# Patient Record
Sex: Female | Born: 1945 | Race: Black or African American | Hispanic: No | Marital: Single | Smoking: Former smoker
Health system: Southern US, Community
[De-identification: ages and names within clinical notes are randomized; demographics above are authoritative.]

## PROBLEM LIST (undated history)

## (undated) DIAGNOSIS — N189 Chronic kidney disease, unspecified: Secondary | ICD-10-CM

## (undated) DIAGNOSIS — E039 Hypothyroidism, unspecified: Secondary | ICD-10-CM

## (undated) DIAGNOSIS — M1990 Unspecified osteoarthritis, unspecified site: Secondary | ICD-10-CM

## (undated) DIAGNOSIS — E119 Type 2 diabetes mellitus without complications: Secondary | ICD-10-CM

## (undated) DIAGNOSIS — I10 Essential (primary) hypertension: Secondary | ICD-10-CM

## (undated) HISTORY — PX: BREAST SURGERY: SHX581

## (undated) HISTORY — PX: APPENDECTOMY: SHX54

## (undated) HISTORY — PX: CATARACT EXTRACTION, BILATERAL: SHX1313

## (undated) HISTORY — PX: ABDOMINAL HYSTERECTOMY: SHX81

## (undated) HISTORY — PX: HAND SURGERY: SHX662

---

## 2005-01-21 ENCOUNTER — Ambulatory Visit (HOSPITAL_COMMUNITY): Admission: RE | Admit: 2005-01-21 | Discharge: 2005-01-21 | Payer: Self-pay | Admitting: Family Medicine

## 2005-09-19 ENCOUNTER — Encounter (HOSPITAL_COMMUNITY): Admission: RE | Admit: 2005-09-19 | Discharge: 2005-10-19 | Payer: Self-pay | Admitting: Family Medicine

## 2006-12-29 ENCOUNTER — Ambulatory Visit: Payer: Self-pay | Admitting: Orthopedic Surgery

## 2007-06-26 DIAGNOSIS — E119 Type 2 diabetes mellitus without complications: Secondary | ICD-10-CM | POA: Insufficient documentation

## 2007-06-26 DIAGNOSIS — Z8679 Personal history of other diseases of the circulatory system: Secondary | ICD-10-CM | POA: Insufficient documentation

## 2007-06-29 ENCOUNTER — Ambulatory Visit: Payer: Self-pay | Admitting: Orthopedic Surgery

## 2007-06-29 DIAGNOSIS — M25569 Pain in unspecified knee: Secondary | ICD-10-CM | POA: Insufficient documentation

## 2007-06-29 DIAGNOSIS — M1710 Unilateral primary osteoarthritis, unspecified knee: Secondary | ICD-10-CM

## 2007-11-09 ENCOUNTER — Ambulatory Visit: Payer: Self-pay | Admitting: Orthopedic Surgery

## 2007-11-09 DIAGNOSIS — G5600 Carpal tunnel syndrome, unspecified upper limb: Secondary | ICD-10-CM

## 2007-11-10 ENCOUNTER — Encounter: Payer: Self-pay | Admitting: Orthopedic Surgery

## 2007-11-19 ENCOUNTER — Encounter: Payer: Self-pay | Admitting: Orthopedic Surgery

## 2008-01-11 ENCOUNTER — Ambulatory Visit: Payer: Self-pay | Admitting: Orthopedic Surgery

## 2008-03-09 ENCOUNTER — Ambulatory Visit: Payer: Self-pay | Admitting: Orthopedic Surgery

## 2008-03-14 ENCOUNTER — Encounter: Payer: Self-pay | Admitting: Orthopedic Surgery

## 2008-04-22 ENCOUNTER — Encounter: Payer: Self-pay | Admitting: Orthopedic Surgery

## 2008-05-02 ENCOUNTER — Telehealth: Payer: Self-pay | Admitting: Orthopedic Surgery

## 2008-05-19 ENCOUNTER — Encounter: Payer: Self-pay | Admitting: Orthopedic Surgery

## 2008-05-30 ENCOUNTER — Encounter: Payer: Self-pay | Admitting: Orthopedic Surgery

## 2008-07-01 ENCOUNTER — Encounter: Payer: Self-pay | Admitting: Orthopedic Surgery

## 2008-07-04 ENCOUNTER — Telehealth: Payer: Self-pay | Admitting: Orthopedic Surgery

## 2008-07-21 ENCOUNTER — Telehealth: Payer: Self-pay | Admitting: Orthopedic Surgery

## 2008-10-03 ENCOUNTER — Emergency Department (HOSPITAL_COMMUNITY): Admission: EM | Admit: 2008-10-03 | Discharge: 2008-10-04 | Payer: Self-pay | Admitting: Emergency Medicine

## 2008-10-05 ENCOUNTER — Ambulatory Visit (HOSPITAL_COMMUNITY): Admission: RE | Admit: 2008-10-05 | Discharge: 2008-10-05 | Payer: Self-pay | Admitting: Family Medicine

## 2009-09-25 ENCOUNTER — Ambulatory Visit (HOSPITAL_COMMUNITY): Admission: RE | Admit: 2009-09-25 | Discharge: 2009-09-25 | Payer: Self-pay | Admitting: Family Medicine

## 2009-10-07 ENCOUNTER — Encounter: Payer: Self-pay | Admitting: Orthopedic Surgery

## 2009-10-07 ENCOUNTER — Emergency Department (HOSPITAL_COMMUNITY): Admission: EM | Admit: 2009-10-07 | Discharge: 2009-10-07 | Payer: Self-pay | Admitting: Emergency Medicine

## 2009-10-31 ENCOUNTER — Encounter: Payer: Self-pay | Admitting: Orthopedic Surgery

## 2009-11-01 ENCOUNTER — Ambulatory Visit: Payer: Self-pay | Admitting: Orthopedic Surgery

## 2009-11-01 DIAGNOSIS — M1710 Unilateral primary osteoarthritis, unspecified knee: Secondary | ICD-10-CM | POA: Insufficient documentation

## 2009-11-01 DIAGNOSIS — IMO0002 Reserved for concepts with insufficient information to code with codable children: Secondary | ICD-10-CM

## 2010-08-14 NOTE — Assessment & Plan Note (Signed)
Summary: [name]   Vital Signs:  Patient profile:   65 year old female Height:      67 inches Weight:      306 pounds Pulse rate:   78 / minute Resp:     16 per minute  Vitals Entered By: [name] (November 01, 2009 10:24 AM)  Visit Type:  new patient Referring Provider:  Dr. Caron Presume and [facility] Primary Provider:  Dr. Caron Presume  CC:  right knee pain.  History of Present Illness: I saw Joan Torres in the office today for an initial visit.  She is a 64 years old woman with the complaint of:  posterior right knee pain.  The patient hurt her knee, 3 and half weeks ago, she hit it against a door. She was having some symptoms before that and now has pain in the back of the knee and not in the back of the knee and great 10 pain, which is constant.  She has throbbing and stabbing, and some anterolateral pain as well.  She went to the emergency room the x-rays show medial compartment arthritis  [facility] xrays3/26/[facility] right knee.  Meds: Lisinopril, Pravastatin, Actoplus, Lasix, Synthroid, Chlorthalidone, Glyburide, Diltiazem, ASA.  On 10/07/09 [facility] er was given Prdenisone for 5 days and Naprosyn two times a day for 10 days.          Allergies (verified): No Known Drug Allergies  Past History:  Past Medical History: Diabetes High Blood Pressure arthritis Thyroid  Past Surgical History: Partial hysterectomy 2 Cyst on breast, benign cyst on hand benign  Family History: Family History of Diabetes Family History of Arthritis  Social History: Patient is single.  retired no smoking no alcohol no caffeine  Review of Systems Constitutional:  Complains of weight gain and chills; denies weight loss, fever, and fatigue. Cardiovascular:  Denies chest pain, palpitations, fainting, and murmurs. Respiratory:  Denies short of breath, wheezing, couch, tightness, pain on inspiration, and snoring . Gastrointestinal:  Complains of heartburn and  blood in your stools; denies nausea, vomiting, diarrhea, and constipation. Genitourinary:  Denies frequency, urgency, difficulty urinating, painful urination, flank pain, and bleeding in urine. Neurologic:  Denies numbness, tingling, unsteady gait, dizziness, tremors, and seizure. Musculoskeletal:  Complains of joint pain; denies swelling, instability, stiffness, redness, heat, and muscle pain. Endocrine:  Denies excessive thirst, exessive urination, and heat or cold intolerance. Psychiatric:  Denies nervousness, depression, anxiety, and hallucinations. Skin:  Denies changes in the skin, poor healing, rash, itching, and redness. HEENT:  Complains of watering; denies blurred or double vision, eye pain, and redness. Immunology:  Denies seasonal allergies, sinus problems, and allergic to bee stings. Hemoatologic:  Denies easy bleeding and brusing.  Physical Exam  Additional Exam:  GEN: well developed, well nourished, normal grooming and hygiene, no deformity and abnormal body habitus, obese  CDV: pulses are normal, no edema, no erythema. no tenderness  Lymph: normal lymph nodes   Skin: no rashes, skin lesions or open sores   NEURO: normal coordination, reflexes, sensation.   Psyche: awake, alert and oriented. Mood normal   Gait: looks normal to me.  She has some lateral, and medial joint line pain. No effusion.  She is 115 of knee flexion with full extension.  grade 5 motor exam, ligaments, stable      Impression & Recommendations:  Problem # 1:  ARTHRITIS, RIGHT KNEE (ICD-716.96) Assessment Comment Only  Verbal consent was obtained. The knee was prepped with alcohol and  ethyl chloride. 1 cc of depomedrol 40mg /cc and 4 cc of lidocaine 1% was injected. there were no complications. injected RIGHT knee.  Hospital films were observed. 4 views, RIGHT knee medial compartment arthritis. It is moderate to severe  Orders: New Patient Level III [id number]) Depo- Medrol 40mg   (J1030) Joint Aspirate / Injection, Large (20610)  Medications Added to Medication List This Visit: 1)  Lortab 5-500 Mg Tabs (Hydrocodone-acetaminophen) .Marland Kitchen.. 1 by mouth q 4 as needed pain  Patient Instructions: 1)  You have received an injection of cortisone today. You may experience increased pain at the injection site. Apply ice pack to the area for 20 minutes every 2 hours and take 2 xtra strength tylenol every 8 hours. This increased pain will usually resolve in 24 hours. The injection will take effect in 3-10 days.  2)  Please schedule a follow-up appointment in 3 weeks. Prescriptions: LORTAB 5-500 MG TABS (HYDROCODONE-ACETAMINOPHEN) 1 by mouth q 4 as needed pain  #42 x 2   Entered and Authorized by:   [name]   Signed by:   [name] on 11/01/2009   Method used:   Print then Give to Patient   RxID:   [id number]

## 2010-08-14 NOTE — Letter (Signed)
Summary: History form  History form   Imported By: Ruffin Pyo 11/02/2009 10:29:43  _____________________________________________________________________  External Attachment:    Type:   Image     Comment:   External Document

## 2010-10-25 LAB — POCT I-STAT, CHEM 8
Calcium, Ion: 1.06 mmol/L — ABNORMAL LOW (ref 1.12–1.32)
Chloride: 101 mEq/L (ref 96–112)
HCT: 37 % (ref 36.0–46.0)
Hemoglobin: 12.6 g/dL (ref 12.0–15.0)
Potassium: 2.5 mEq/L — CL (ref 3.5–5.1)
Sodium: 146 mEq/L — ABNORMAL HIGH (ref 135–145)
TCO2: 27 mmol/L (ref 0–100)

## 2010-10-25 LAB — URINALYSIS, ROUTINE W REFLEX MICROSCOPIC
Specific Gravity, Urine: 1.015 (ref 1.005–1.030)
Urobilinogen, UA: 0.2 mg/dL (ref 0.0–1.0)

## 2010-10-25 LAB — URINE MICROSCOPIC-ADD ON

## 2011-01-16 IMAGING — CR DG KNEE COMPLETE 4+V*R*
4 series · 4 of 4 positions shown · non-contrast
Comparison: None

CLINICAL DATA: Right knee pain.  No injury.  Knee swelling.

RIGHT KNEE - COMPLETE 4+ VIEW

[view not recorded (1 of 4)]
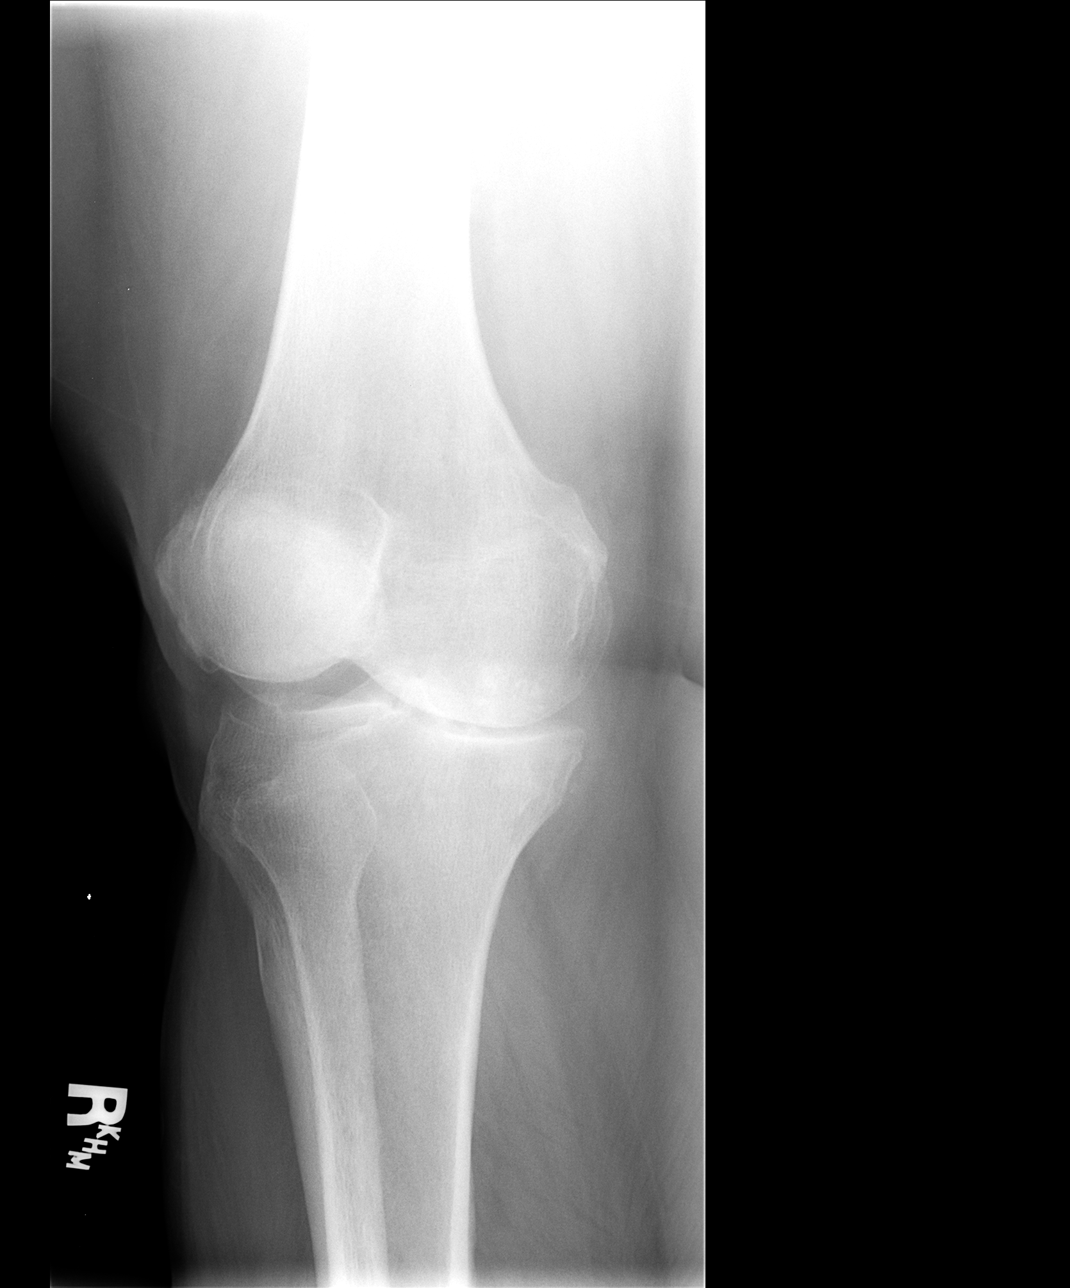

[view not recorded (2 of 4)]
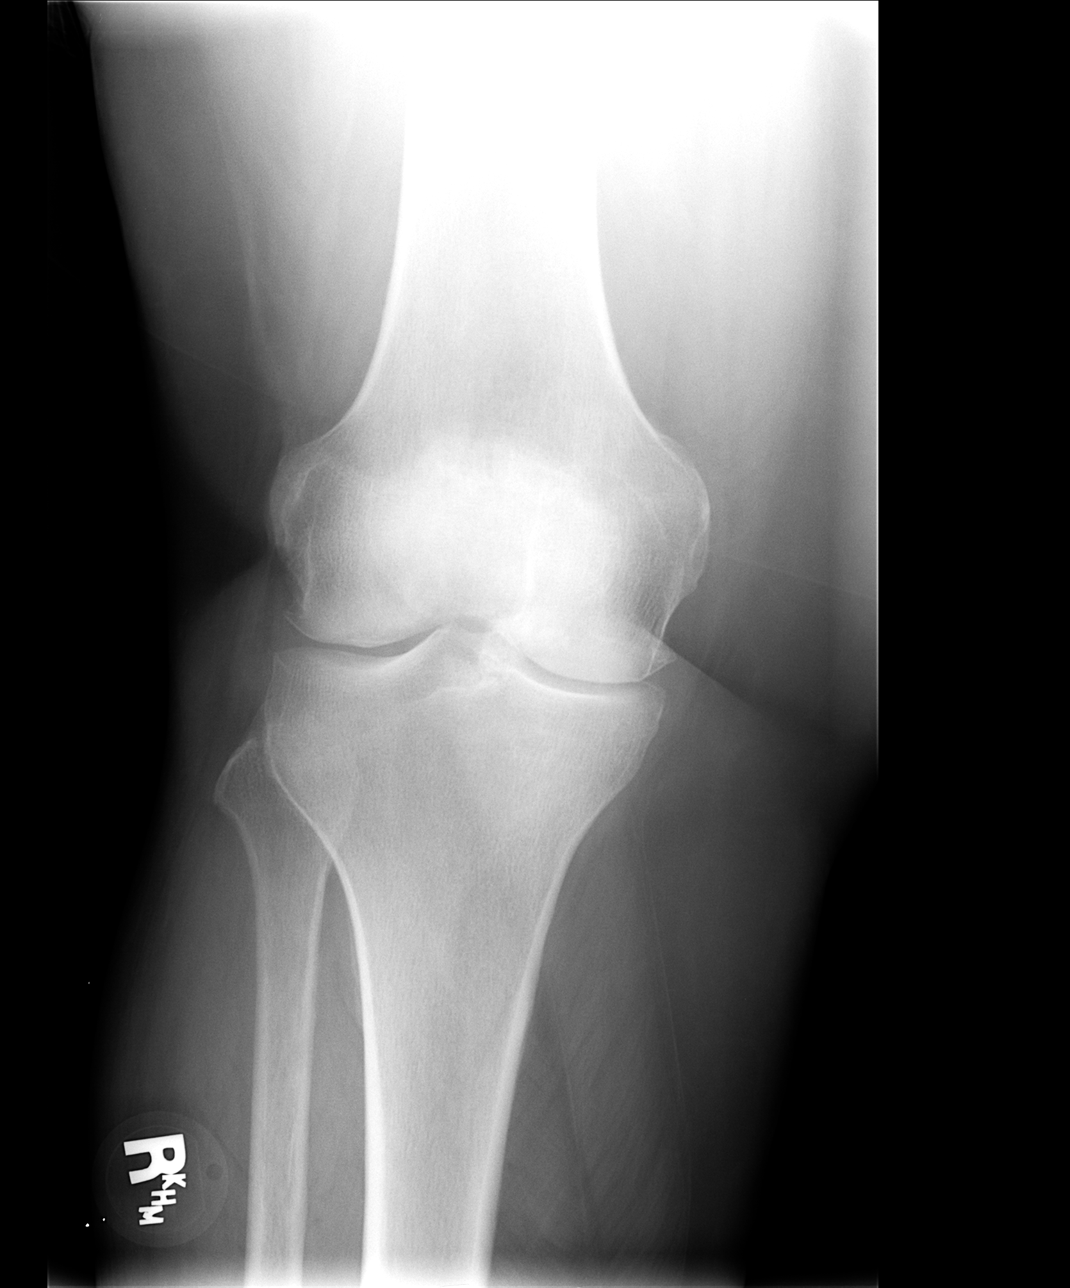

[view not recorded (3 of 4)]
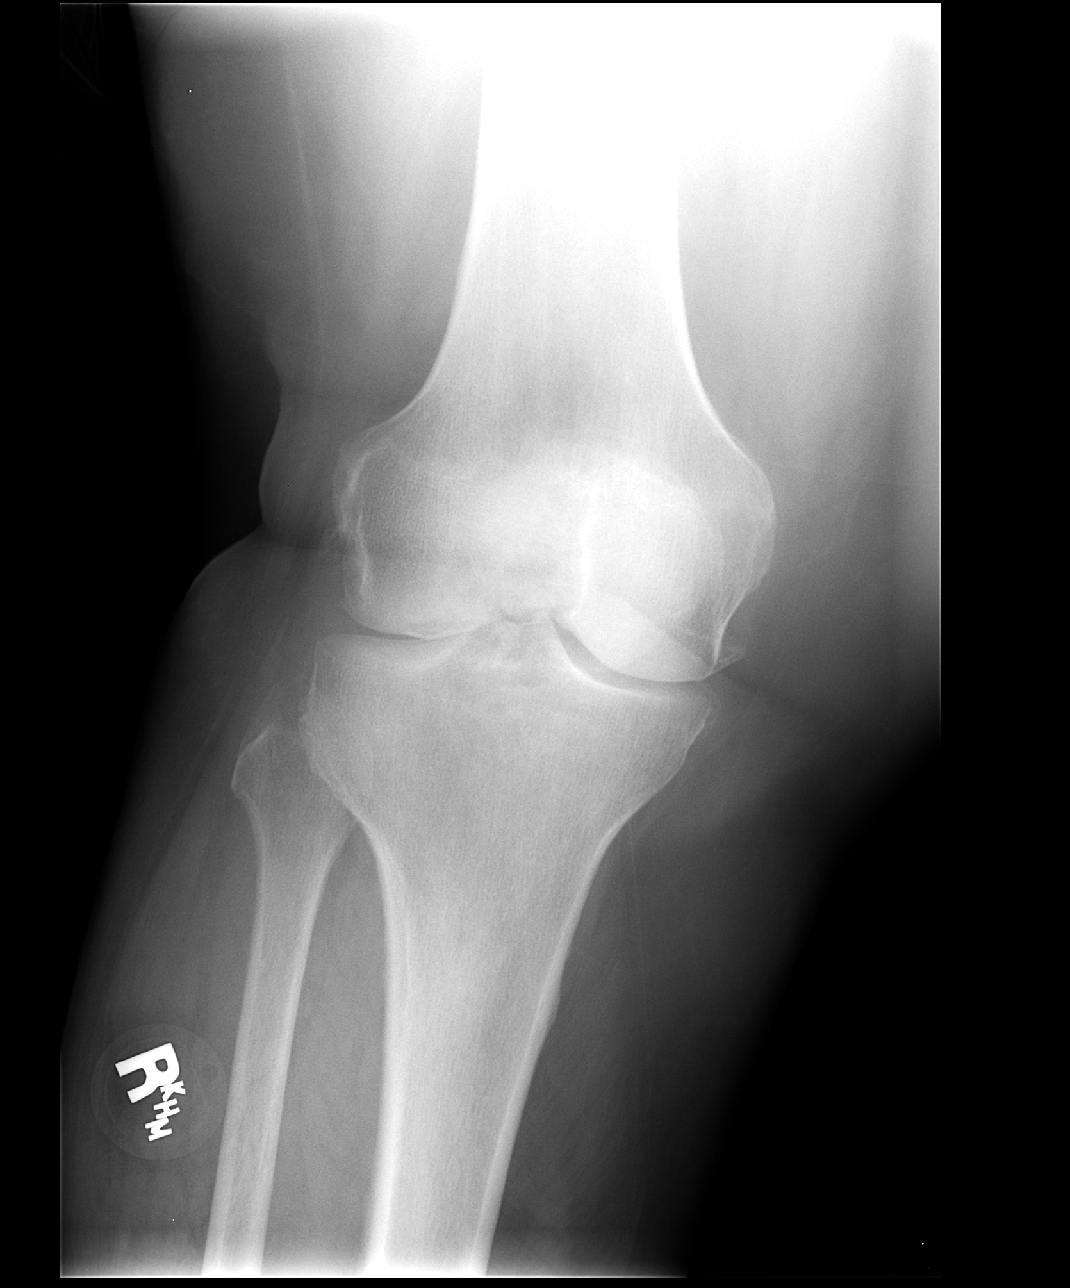

[view not recorded (4 of 4)]
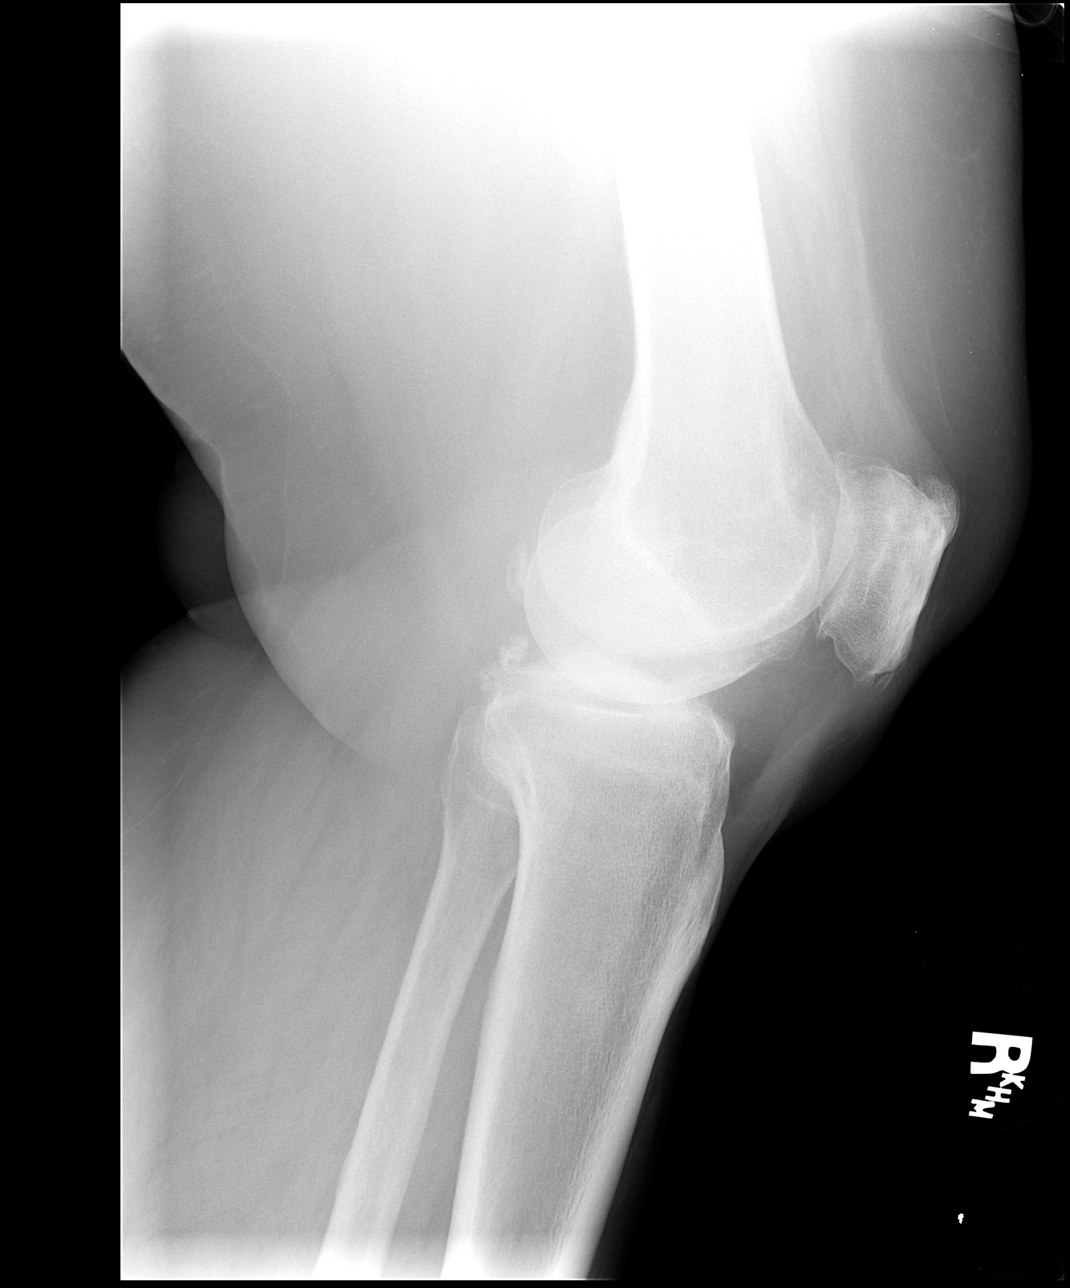

[4 of 4 positions shown; findings below may reference images not displayed]

FINDINGS: There is no joint effusion.

There is no fracture or dislocation identified.

Marginal spur formation and sharpening tibial spines noted.

There are no fractures.

There are several ossific densities within the posterior joint
space which may represent loose bodies.
IMPRESSION: 1.  Moderate degenerative joint disease.
2.  No acute changes.

## 2014-02-10 DIAGNOSIS — E7800 Pure hypercholesterolemia, unspecified: Secondary | ICD-10-CM | POA: Insufficient documentation

## 2014-02-10 DIAGNOSIS — I10 Essential (primary) hypertension: Secondary | ICD-10-CM | POA: Insufficient documentation

## 2014-02-10 DIAGNOSIS — M1710 Unilateral primary osteoarthritis, unspecified knee: Secondary | ICD-10-CM | POA: Insufficient documentation

## 2014-02-10 DIAGNOSIS — N183 Chronic kidney disease, stage 3 unspecified: Secondary | ICD-10-CM | POA: Insufficient documentation

## 2014-03-18 DIAGNOSIS — I440 Atrioventricular block, first degree: Secondary | ICD-10-CM | POA: Insufficient documentation

## 2014-07-28 DIAGNOSIS — J302 Other seasonal allergic rhinitis: Secondary | ICD-10-CM | POA: Diagnosis not present

## 2014-07-28 DIAGNOSIS — N184 Chronic kidney disease, stage 4 (severe): Secondary | ICD-10-CM | POA: Diagnosis not present

## 2014-07-28 DIAGNOSIS — E039 Hypothyroidism, unspecified: Secondary | ICD-10-CM | POA: Diagnosis not present

## 2014-07-28 DIAGNOSIS — E1165 Type 2 diabetes mellitus with hyperglycemia: Secondary | ICD-10-CM | POA: Diagnosis not present

## 2014-07-28 DIAGNOSIS — J019 Acute sinusitis, unspecified: Secondary | ICD-10-CM | POA: Diagnosis not present

## 2014-08-08 DIAGNOSIS — E039 Hypothyroidism, unspecified: Secondary | ICD-10-CM | POA: Diagnosis not present

## 2014-08-08 DIAGNOSIS — N184 Chronic kidney disease, stage 4 (severe): Secondary | ICD-10-CM | POA: Diagnosis not present

## 2014-08-08 DIAGNOSIS — M109 Gout, unspecified: Secondary | ICD-10-CM | POA: Diagnosis not present

## 2014-08-08 DIAGNOSIS — E1165 Type 2 diabetes mellitus with hyperglycemia: Secondary | ICD-10-CM | POA: Diagnosis not present

## 2014-08-08 DIAGNOSIS — E782 Mixed hyperlipidemia: Secondary | ICD-10-CM | POA: Diagnosis not present

## 2014-08-08 DIAGNOSIS — E559 Vitamin D deficiency, unspecified: Secondary | ICD-10-CM | POA: Diagnosis not present

## 2014-10-27 DIAGNOSIS — Z1231 Encounter for screening mammogram for malignant neoplasm of breast: Secondary | ICD-10-CM | POA: Diagnosis not present

## 2014-12-15 DIAGNOSIS — I10 Essential (primary) hypertension: Secondary | ICD-10-CM | POA: Diagnosis not present

## 2014-12-15 DIAGNOSIS — E782 Mixed hyperlipidemia: Secondary | ICD-10-CM | POA: Diagnosis not present

## 2014-12-15 DIAGNOSIS — E039 Hypothyroidism, unspecified: Secondary | ICD-10-CM | POA: Diagnosis not present

## 2014-12-15 DIAGNOSIS — N184 Chronic kidney disease, stage 4 (severe): Secondary | ICD-10-CM | POA: Diagnosis not present

## 2014-12-15 DIAGNOSIS — E1165 Type 2 diabetes mellitus with hyperglycemia: Secondary | ICD-10-CM | POA: Diagnosis not present

## 2014-12-22 DIAGNOSIS — M109 Gout, unspecified: Secondary | ICD-10-CM | POA: Diagnosis not present

## 2014-12-22 DIAGNOSIS — S1096XA Insect bite of unspecified part of neck, initial encounter: Secondary | ICD-10-CM | POA: Diagnosis not present

## 2014-12-22 DIAGNOSIS — Z0001 Encounter for general adult medical examination with abnormal findings: Secondary | ICD-10-CM | POA: Diagnosis not present

## 2014-12-22 DIAGNOSIS — I10 Essential (primary) hypertension: Secondary | ICD-10-CM | POA: Diagnosis not present

## 2014-12-22 DIAGNOSIS — E1165 Type 2 diabetes mellitus with hyperglycemia: Secondary | ICD-10-CM | POA: Diagnosis not present

## 2014-12-22 DIAGNOSIS — Z1389 Encounter for screening for other disorder: Secondary | ICD-10-CM | POA: Diagnosis not present

## 2014-12-22 DIAGNOSIS — Z23 Encounter for immunization: Secondary | ICD-10-CM | POA: Diagnosis not present

## 2014-12-22 DIAGNOSIS — E782 Mixed hyperlipidemia: Secondary | ICD-10-CM | POA: Diagnosis not present

## 2014-12-23 DIAGNOSIS — E1165 Type 2 diabetes mellitus with hyperglycemia: Secondary | ICD-10-CM | POA: Diagnosis not present

## 2015-01-22 DIAGNOSIS — I10 Essential (primary) hypertension: Secondary | ICD-10-CM | POA: Diagnosis not present

## 2015-01-25 DIAGNOSIS — L603 Nail dystrophy: Secondary | ICD-10-CM | POA: Diagnosis not present

## 2015-01-25 DIAGNOSIS — M79609 Pain in unspecified limb: Secondary | ICD-10-CM | POA: Diagnosis not present

## 2015-01-25 DIAGNOSIS — M722 Plantar fascial fibromatosis: Secondary | ICD-10-CM | POA: Diagnosis not present

## 2015-01-25 DIAGNOSIS — I739 Peripheral vascular disease, unspecified: Secondary | ICD-10-CM | POA: Diagnosis not present

## 2015-02-22 DIAGNOSIS — I10 Essential (primary) hypertension: Secondary | ICD-10-CM | POA: Diagnosis not present

## 2015-03-25 DIAGNOSIS — I10 Essential (primary) hypertension: Secondary | ICD-10-CM | POA: Diagnosis not present

## 2015-04-13 DIAGNOSIS — E1151 Type 2 diabetes mellitus with diabetic peripheral angiopathy without gangrene: Secondary | ICD-10-CM | POA: Diagnosis not present

## 2015-04-13 DIAGNOSIS — I739 Peripheral vascular disease, unspecified: Secondary | ICD-10-CM | POA: Diagnosis not present

## 2015-04-13 DIAGNOSIS — L603 Nail dystrophy: Secondary | ICD-10-CM | POA: Diagnosis not present

## 2015-04-24 DIAGNOSIS — I10 Essential (primary) hypertension: Secondary | ICD-10-CM | POA: Diagnosis not present

## 2015-05-25 DIAGNOSIS — I10 Essential (primary) hypertension: Secondary | ICD-10-CM | POA: Diagnosis not present

## 2015-05-25 DIAGNOSIS — E119 Type 2 diabetes mellitus without complications: Secondary | ICD-10-CM | POA: Diagnosis not present

## 2015-06-14 DIAGNOSIS — E782 Mixed hyperlipidemia: Secondary | ICD-10-CM | POA: Diagnosis not present

## 2015-06-14 DIAGNOSIS — E1165 Type 2 diabetes mellitus with hyperglycemia: Secondary | ICD-10-CM | POA: Diagnosis not present

## 2015-06-14 DIAGNOSIS — I10 Essential (primary) hypertension: Secondary | ICD-10-CM | POA: Diagnosis not present

## 2015-06-21 DIAGNOSIS — E782 Mixed hyperlipidemia: Secondary | ICD-10-CM | POA: Diagnosis not present

## 2015-06-21 DIAGNOSIS — I10 Essential (primary) hypertension: Secondary | ICD-10-CM | POA: Diagnosis not present

## 2015-06-21 DIAGNOSIS — M7751 Other enthesopathy of right foot: Secondary | ICD-10-CM | POA: Diagnosis not present

## 2015-06-21 DIAGNOSIS — E039 Hypothyroidism, unspecified: Secondary | ICD-10-CM | POA: Diagnosis not present

## 2015-06-21 DIAGNOSIS — E1122 Type 2 diabetes mellitus with diabetic chronic kidney disease: Secondary | ICD-10-CM | POA: Diagnosis not present

## 2015-06-21 DIAGNOSIS — E1165 Type 2 diabetes mellitus with hyperglycemia: Secondary | ICD-10-CM | POA: Diagnosis not present

## 2015-06-21 DIAGNOSIS — M109 Gout, unspecified: Secondary | ICD-10-CM | POA: Diagnosis not present

## 2015-06-21 DIAGNOSIS — N184 Chronic kidney disease, stage 4 (severe): Secondary | ICD-10-CM | POA: Diagnosis not present

## 2015-06-24 DIAGNOSIS — J45909 Unspecified asthma, uncomplicated: Secondary | ICD-10-CM | POA: Diagnosis not present

## 2015-06-24 DIAGNOSIS — I10 Essential (primary) hypertension: Secondary | ICD-10-CM | POA: Diagnosis not present

## 2015-09-15 DIAGNOSIS — E782 Mixed hyperlipidemia: Secondary | ICD-10-CM | POA: Diagnosis not present

## 2015-09-15 DIAGNOSIS — E559 Vitamin D deficiency, unspecified: Secondary | ICD-10-CM | POA: Diagnosis not present

## 2015-09-15 DIAGNOSIS — E1122 Type 2 diabetes mellitus with diabetic chronic kidney disease: Secondary | ICD-10-CM | POA: Diagnosis not present

## 2015-09-15 DIAGNOSIS — N184 Chronic kidney disease, stage 4 (severe): Secondary | ICD-10-CM | POA: Diagnosis not present

## 2015-09-15 DIAGNOSIS — I10 Essential (primary) hypertension: Secondary | ICD-10-CM | POA: Diagnosis not present

## 2015-09-19 DIAGNOSIS — N184 Chronic kidney disease, stage 4 (severe): Secondary | ICD-10-CM | POA: Diagnosis not present

## 2015-09-19 DIAGNOSIS — E1122 Type 2 diabetes mellitus with diabetic chronic kidney disease: Secondary | ICD-10-CM | POA: Diagnosis not present

## 2015-09-19 DIAGNOSIS — M7751 Other enthesopathy of right foot: Secondary | ICD-10-CM | POA: Diagnosis not present

## 2015-09-19 DIAGNOSIS — I10 Essential (primary) hypertension: Secondary | ICD-10-CM | POA: Diagnosis not present

## 2015-09-19 DIAGNOSIS — E782 Mixed hyperlipidemia: Secondary | ICD-10-CM | POA: Diagnosis not present

## 2015-09-19 DIAGNOSIS — S46111A Strain of muscle, fascia and tendon of long head of biceps, right arm, initial encounter: Secondary | ICD-10-CM | POA: Diagnosis not present

## 2015-09-19 DIAGNOSIS — E039 Hypothyroidism, unspecified: Secondary | ICD-10-CM | POA: Diagnosis not present

## 2015-09-19 DIAGNOSIS — M7652 Patellar tendinitis, left knee: Secondary | ICD-10-CM | POA: Diagnosis not present

## 2015-10-05 DIAGNOSIS — J302 Other seasonal allergic rhinitis: Secondary | ICD-10-CM | POA: Diagnosis not present

## 2015-10-05 DIAGNOSIS — J019 Acute sinusitis, unspecified: Secondary | ICD-10-CM | POA: Diagnosis not present

## 2015-10-26 ENCOUNTER — Emergency Department (HOSPITAL_COMMUNITY): Payer: No Typology Code available for payment source

## 2015-10-26 ENCOUNTER — Encounter (HOSPITAL_COMMUNITY): Payer: Self-pay

## 2015-10-26 ENCOUNTER — Emergency Department (HOSPITAL_COMMUNITY)
Admission: EM | Admit: 2015-10-26 | Discharge: 2015-10-26 | Disposition: A | Discharge status: Home / Self Care | Payer: No Typology Code available for payment source | Attending: Emergency Medicine | Admitting: Emergency Medicine

## 2015-10-26 DIAGNOSIS — Y999 Unspecified external cause status: Secondary | ICD-10-CM | POA: Insufficient documentation

## 2015-10-26 DIAGNOSIS — Z7984 Long term (current) use of oral hypoglycemic drugs: Secondary | ICD-10-CM | POA: Insufficient documentation

## 2015-10-26 DIAGNOSIS — I10 Essential (primary) hypertension: Secondary | ICD-10-CM | POA: Diagnosis not present

## 2015-10-26 DIAGNOSIS — Y9289 Other specified places as the place of occurrence of the external cause: Secondary | ICD-10-CM | POA: Diagnosis not present

## 2015-10-26 DIAGNOSIS — Y939 Activity, unspecified: Secondary | ICD-10-CM | POA: Diagnosis not present

## 2015-10-26 DIAGNOSIS — S161XXA Strain of muscle, fascia and tendon at neck level, initial encounter: Secondary | ICD-10-CM | POA: Diagnosis not present

## 2015-10-26 DIAGNOSIS — Z794 Long term (current) use of insulin: Secondary | ICD-10-CM | POA: Insufficient documentation

## 2015-10-26 DIAGNOSIS — M542 Cervicalgia: Secondary | ICD-10-CM

## 2015-10-26 DIAGNOSIS — T148XXA Other injury of unspecified body region, initial encounter: Secondary | ICD-10-CM

## 2015-10-26 DIAGNOSIS — E119 Type 2 diabetes mellitus without complications: Secondary | ICD-10-CM | POA: Insufficient documentation

## 2015-10-26 DIAGNOSIS — Z79899 Other long term (current) drug therapy: Secondary | ICD-10-CM | POA: Insufficient documentation

## 2015-10-26 DIAGNOSIS — S199XXA Unspecified injury of neck, initial encounter: Secondary | ICD-10-CM | POA: Diagnosis not present

## 2015-10-26 HISTORY — DX: Essential (primary) hypertension: I10

## 2015-10-26 HISTORY — DX: Type 2 diabetes mellitus without complications: E11.9

## 2015-10-26 MED ORDER — ACETAMINOPHEN 325 MG PO TABS
650.0000 mg | ORAL_TABLET | Freq: Once | ORAL | Status: AC
  Administered 2015-10-26: 650 mg via ORAL
  Filled 2015-10-26: qty 2
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
## 2015-10-30 DIAGNOSIS — M79629 Pain in unspecified upper arm: Secondary | ICD-10-CM | POA: Diagnosis not present

## 2015-12-14 DIAGNOSIS — E1165 Type 2 diabetes mellitus with hyperglycemia: Secondary | ICD-10-CM | POA: Diagnosis not present

## 2015-12-14 DIAGNOSIS — N184 Chronic kidney disease, stage 4 (severe): Secondary | ICD-10-CM | POA: Diagnosis not present

## 2015-12-14 DIAGNOSIS — E782 Mixed hyperlipidemia: Secondary | ICD-10-CM | POA: Diagnosis not present

## 2015-12-14 DIAGNOSIS — E039 Hypothyroidism, unspecified: Secondary | ICD-10-CM | POA: Diagnosis not present

## 2015-12-14 DIAGNOSIS — I10 Essential (primary) hypertension: Secondary | ICD-10-CM | POA: Diagnosis not present

## 2015-12-19 DIAGNOSIS — E1122 Type 2 diabetes mellitus with diabetic chronic kidney disease: Secondary | ICD-10-CM | POA: Diagnosis not present

## 2015-12-19 DIAGNOSIS — E1165 Type 2 diabetes mellitus with hyperglycemia: Secondary | ICD-10-CM | POA: Diagnosis not present

## 2015-12-19 DIAGNOSIS — E039 Hypothyroidism, unspecified: Secondary | ICD-10-CM | POA: Diagnosis not present

## 2015-12-19 DIAGNOSIS — N184 Chronic kidney disease, stage 4 (severe): Secondary | ICD-10-CM | POA: Diagnosis not present

## 2015-12-19 DIAGNOSIS — I10 Essential (primary) hypertension: Secondary | ICD-10-CM | POA: Diagnosis not present

## 2015-12-19 DIAGNOSIS — E119 Type 2 diabetes mellitus without complications: Secondary | ICD-10-CM | POA: Diagnosis not present

## 2015-12-19 DIAGNOSIS — M25512 Pain in left shoulder: Secondary | ICD-10-CM | POA: Diagnosis not present

## 2015-12-19 DIAGNOSIS — E782 Mixed hyperlipidemia: Secondary | ICD-10-CM | POA: Diagnosis not present

## 2015-12-22 DIAGNOSIS — M19012 Primary osteoarthritis, left shoulder: Secondary | ICD-10-CM | POA: Diagnosis not present

## 2015-12-22 DIAGNOSIS — M25512 Pain in left shoulder: Secondary | ICD-10-CM | POA: Diagnosis not present

## 2016-01-22 DIAGNOSIS — E039 Hypothyroidism, unspecified: Secondary | ICD-10-CM | POA: Diagnosis not present

## 2016-01-22 DIAGNOSIS — M1712 Unilateral primary osteoarthritis, left knee: Secondary | ICD-10-CM | POA: Diagnosis not present

## 2016-01-22 DIAGNOSIS — I10 Essential (primary) hypertension: Secondary | ICD-10-CM | POA: Diagnosis not present

## 2016-01-22 DIAGNOSIS — E1165 Type 2 diabetes mellitus with hyperglycemia: Secondary | ICD-10-CM | POA: Diagnosis not present

## 2016-01-22 DIAGNOSIS — E1122 Type 2 diabetes mellitus with diabetic chronic kidney disease: Secondary | ICD-10-CM | POA: Diagnosis not present

## 2016-01-22 DIAGNOSIS — N184 Chronic kidney disease, stage 4 (severe): Secondary | ICD-10-CM | POA: Diagnosis not present

## 2016-01-22 DIAGNOSIS — E782 Mixed hyperlipidemia: Secondary | ICD-10-CM | POA: Diagnosis not present

## 2016-01-22 DIAGNOSIS — H40003 Preglaucoma, unspecified, bilateral: Secondary | ICD-10-CM | POA: Diagnosis not present

## 2016-01-29 DIAGNOSIS — H40003 Preglaucoma, unspecified, bilateral: Secondary | ICD-10-CM | POA: Diagnosis not present

## 2016-03-20 DIAGNOSIS — E1165 Type 2 diabetes mellitus with hyperglycemia: Secondary | ICD-10-CM | POA: Diagnosis not present

## 2016-03-20 DIAGNOSIS — N184 Chronic kidney disease, stage 4 (severe): Secondary | ICD-10-CM | POA: Diagnosis not present

## 2016-03-20 DIAGNOSIS — I10 Essential (primary) hypertension: Secondary | ICD-10-CM | POA: Diagnosis not present

## 2016-03-20 DIAGNOSIS — E782 Mixed hyperlipidemia: Secondary | ICD-10-CM | POA: Diagnosis not present

## 2016-03-20 DIAGNOSIS — E039 Hypothyroidism, unspecified: Secondary | ICD-10-CM | POA: Diagnosis not present

## 2016-03-25 DIAGNOSIS — E1122 Type 2 diabetes mellitus with diabetic chronic kidney disease: Secondary | ICD-10-CM | POA: Diagnosis not present

## 2016-03-25 DIAGNOSIS — E559 Vitamin D deficiency, unspecified: Secondary | ICD-10-CM | POA: Diagnosis not present

## 2016-03-25 DIAGNOSIS — E039 Hypothyroidism, unspecified: Secondary | ICD-10-CM | POA: Diagnosis not present

## 2016-03-25 DIAGNOSIS — Z6841 Body Mass Index (BMI) 40.0 and over, adult: Secondary | ICD-10-CM | POA: Diagnosis not present

## 2016-03-25 DIAGNOSIS — M109 Gout, unspecified: Secondary | ICD-10-CM | POA: Diagnosis not present

## 2016-03-25 DIAGNOSIS — E1165 Type 2 diabetes mellitus with hyperglycemia: Secondary | ICD-10-CM | POA: Diagnosis not present

## 2016-03-25 DIAGNOSIS — E782 Mixed hyperlipidemia: Secondary | ICD-10-CM | POA: Diagnosis not present

## 2016-04-29 DIAGNOSIS — Z1231 Encounter for screening mammogram for malignant neoplasm of breast: Secondary | ICD-10-CM | POA: Diagnosis not present

## 2016-06-19 DIAGNOSIS — N184 Chronic kidney disease, stage 4 (severe): Secondary | ICD-10-CM | POA: Diagnosis not present

## 2016-06-19 DIAGNOSIS — M545 Low back pain: Secondary | ICD-10-CM | POA: Diagnosis not present

## 2016-06-19 DIAGNOSIS — M10372 Gout due to renal impairment, left ankle and foot: Secondary | ICD-10-CM | POA: Diagnosis not present

## 2016-06-21 DIAGNOSIS — N184 Chronic kidney disease, stage 4 (severe): Secondary | ICD-10-CM | POA: Diagnosis not present

## 2016-06-21 DIAGNOSIS — I10 Essential (primary) hypertension: Secondary | ICD-10-CM | POA: Diagnosis not present

## 2016-06-21 DIAGNOSIS — E1165 Type 2 diabetes mellitus with hyperglycemia: Secondary | ICD-10-CM | POA: Diagnosis not present

## 2016-06-21 DIAGNOSIS — E1122 Type 2 diabetes mellitus with diabetic chronic kidney disease: Secondary | ICD-10-CM | POA: Diagnosis not present

## 2016-06-21 DIAGNOSIS — E782 Mixed hyperlipidemia: Secondary | ICD-10-CM | POA: Diagnosis not present

## 2016-06-21 DIAGNOSIS — E039 Hypothyroidism, unspecified: Secondary | ICD-10-CM | POA: Diagnosis not present

## 2016-06-26 DIAGNOSIS — E1122 Type 2 diabetes mellitus with diabetic chronic kidney disease: Secondary | ICD-10-CM | POA: Diagnosis not present

## 2016-06-26 DIAGNOSIS — M19012 Primary osteoarthritis, left shoulder: Secondary | ICD-10-CM | POA: Diagnosis not present

## 2016-06-26 DIAGNOSIS — E039 Hypothyroidism, unspecified: Secondary | ICD-10-CM | POA: Diagnosis not present

## 2016-06-26 DIAGNOSIS — E782 Mixed hyperlipidemia: Secondary | ICD-10-CM | POA: Diagnosis not present

## 2016-06-26 DIAGNOSIS — I10 Essential (primary) hypertension: Secondary | ICD-10-CM | POA: Diagnosis not present

## 2016-06-26 DIAGNOSIS — E1165 Type 2 diabetes mellitus with hyperglycemia: Secondary | ICD-10-CM | POA: Diagnosis not present

## 2016-06-26 DIAGNOSIS — Z6841 Body Mass Index (BMI) 40.0 and over, adult: Secondary | ICD-10-CM | POA: Diagnosis not present

## 2016-06-26 DIAGNOSIS — N184 Chronic kidney disease, stage 4 (severe): Secondary | ICD-10-CM | POA: Diagnosis not present

## 2016-07-15 HISTORY — PX: CYST REMOVAL NECK: SHX6281

## 2016-09-25 DIAGNOSIS — I10 Essential (primary) hypertension: Secondary | ICD-10-CM | POA: Diagnosis not present

## 2016-09-25 DIAGNOSIS — E782 Mixed hyperlipidemia: Secondary | ICD-10-CM | POA: Diagnosis not present

## 2016-09-25 DIAGNOSIS — E1165 Type 2 diabetes mellitus with hyperglycemia: Secondary | ICD-10-CM | POA: Diagnosis not present

## 2016-09-25 DIAGNOSIS — E039 Hypothyroidism, unspecified: Secondary | ICD-10-CM | POA: Diagnosis not present

## 2016-09-26 DIAGNOSIS — N184 Chronic kidney disease, stage 4 (severe): Secondary | ICD-10-CM | POA: Diagnosis not present

## 2016-09-26 DIAGNOSIS — E1165 Type 2 diabetes mellitus with hyperglycemia: Secondary | ICD-10-CM | POA: Diagnosis not present

## 2016-09-26 DIAGNOSIS — E039 Hypothyroidism, unspecified: Secondary | ICD-10-CM | POA: Diagnosis not present

## 2016-09-26 DIAGNOSIS — M1712 Unilateral primary osteoarthritis, left knee: Secondary | ICD-10-CM | POA: Diagnosis not present

## 2016-09-26 DIAGNOSIS — E782 Mixed hyperlipidemia: Secondary | ICD-10-CM | POA: Diagnosis not present

## 2016-09-26 DIAGNOSIS — E1122 Type 2 diabetes mellitus with diabetic chronic kidney disease: Secondary | ICD-10-CM | POA: Diagnosis not present

## 2016-09-26 DIAGNOSIS — I10 Essential (primary) hypertension: Secondary | ICD-10-CM | POA: Diagnosis not present

## 2016-10-29 DIAGNOSIS — M19031 Primary osteoarthritis, right wrist: Secondary | ICD-10-CM | POA: Diagnosis not present

## 2016-10-29 DIAGNOSIS — M1711 Unilateral primary osteoarthritis, right knee: Secondary | ICD-10-CM | POA: Diagnosis not present

## 2016-10-29 DIAGNOSIS — M1712 Unilateral primary osteoarthritis, left knee: Secondary | ICD-10-CM | POA: Diagnosis not present

## 2016-10-29 DIAGNOSIS — M19041 Primary osteoarthritis, right hand: Secondary | ICD-10-CM | POA: Diagnosis not present

## 2016-11-22 DIAGNOSIS — M25571 Pain in right ankle and joints of right foot: Secondary | ICD-10-CM | POA: Diagnosis not present

## 2016-11-22 DIAGNOSIS — M10371 Gout due to renal impairment, right ankle and foot: Secondary | ICD-10-CM | POA: Diagnosis not present

## 2016-11-22 DIAGNOSIS — M1A0710 Idiopathic chronic gout, right ankle and foot, without tophus (tophi): Secondary | ICD-10-CM | POA: Diagnosis not present

## 2016-12-27 DIAGNOSIS — E782 Mixed hyperlipidemia: Secondary | ICD-10-CM | POA: Diagnosis not present

## 2016-12-27 DIAGNOSIS — E1165 Type 2 diabetes mellitus with hyperglycemia: Secondary | ICD-10-CM | POA: Diagnosis not present

## 2016-12-27 DIAGNOSIS — N184 Chronic kidney disease, stage 4 (severe): Secondary | ICD-10-CM | POA: Diagnosis not present

## 2016-12-27 DIAGNOSIS — E559 Vitamin D deficiency, unspecified: Secondary | ICD-10-CM | POA: Diagnosis not present

## 2016-12-27 DIAGNOSIS — I10 Essential (primary) hypertension: Secondary | ICD-10-CM | POA: Diagnosis not present

## 2016-12-27 DIAGNOSIS — E039 Hypothyroidism, unspecified: Secondary | ICD-10-CM | POA: Diagnosis not present

## 2016-12-27 DIAGNOSIS — E1122 Type 2 diabetes mellitus with diabetic chronic kidney disease: Secondary | ICD-10-CM | POA: Diagnosis not present

## 2017-01-01 DIAGNOSIS — E039 Hypothyroidism, unspecified: Secondary | ICD-10-CM | POA: Diagnosis not present

## 2017-01-01 DIAGNOSIS — Z6841 Body Mass Index (BMI) 40.0 and over, adult: Secondary | ICD-10-CM | POA: Diagnosis not present

## 2017-01-01 DIAGNOSIS — Z0001 Encounter for general adult medical examination with abnormal findings: Secondary | ICD-10-CM | POA: Diagnosis not present

## 2017-01-01 DIAGNOSIS — E559 Vitamin D deficiency, unspecified: Secondary | ICD-10-CM | POA: Diagnosis not present

## 2017-01-01 DIAGNOSIS — M10372 Gout due to renal impairment, left ankle and foot: Secondary | ICD-10-CM | POA: Diagnosis not present

## 2017-01-01 DIAGNOSIS — E1165 Type 2 diabetes mellitus with hyperglycemia: Secondary | ICD-10-CM | POA: Diagnosis not present

## 2017-01-01 DIAGNOSIS — E1122 Type 2 diabetes mellitus with diabetic chronic kidney disease: Secondary | ICD-10-CM | POA: Diagnosis not present

## 2017-01-01 DIAGNOSIS — I10 Essential (primary) hypertension: Secondary | ICD-10-CM | POA: Diagnosis not present

## 2017-01-06 DIAGNOSIS — E042 Nontoxic multinodular goiter: Secondary | ICD-10-CM | POA: Diagnosis not present

## 2017-01-09 DIAGNOSIS — H524 Presbyopia: Secondary | ICD-10-CM | POA: Diagnosis not present

## 2017-01-09 DIAGNOSIS — Z0100 Encounter for examination of eyes and vision without abnormal findings: Secondary | ICD-10-CM | POA: Diagnosis not present

## 2017-01-11 DIAGNOSIS — S86911A Strain of unspecified muscle(s) and tendon(s) at lower leg level, right leg, initial encounter: Secondary | ICD-10-CM | POA: Diagnosis not present

## 2017-01-11 DIAGNOSIS — I129 Hypertensive chronic kidney disease with stage 1 through stage 4 chronic kidney disease, or unspecified chronic kidney disease: Secondary | ICD-10-CM | POA: Diagnosis not present

## 2017-01-11 DIAGNOSIS — M79661 Pain in right lower leg: Secondary | ICD-10-CM | POA: Diagnosis not present

## 2017-01-11 DIAGNOSIS — E1122 Type 2 diabetes mellitus with diabetic chronic kidney disease: Secondary | ICD-10-CM | POA: Diagnosis not present

## 2017-01-11 DIAGNOSIS — E876 Hypokalemia: Secondary | ICD-10-CM | POA: Diagnosis not present

## 2017-01-11 DIAGNOSIS — M1711 Unilateral primary osteoarthritis, right knee: Secondary | ICD-10-CM | POA: Diagnosis not present

## 2017-01-11 DIAGNOSIS — M19071 Primary osteoarthritis, right ankle and foot: Secondary | ICD-10-CM | POA: Diagnosis not present

## 2017-01-11 DIAGNOSIS — N289 Disorder of kidney and ureter, unspecified: Secondary | ICD-10-CM | POA: Diagnosis not present

## 2017-01-11 DIAGNOSIS — E039 Hypothyroidism, unspecified: Secondary | ICD-10-CM | POA: Diagnosis not present

## 2017-01-11 DIAGNOSIS — N189 Chronic kidney disease, unspecified: Secondary | ICD-10-CM | POA: Diagnosis not present

## 2017-01-14 DIAGNOSIS — N184 Chronic kidney disease, stage 4 (severe): Secondary | ICD-10-CM | POA: Diagnosis not present

## 2017-01-14 DIAGNOSIS — E782 Mixed hyperlipidemia: Secondary | ICD-10-CM | POA: Diagnosis not present

## 2017-01-14 DIAGNOSIS — M545 Low back pain: Secondary | ICD-10-CM | POA: Diagnosis not present

## 2017-01-14 DIAGNOSIS — E1122 Type 2 diabetes mellitus with diabetic chronic kidney disease: Secondary | ICD-10-CM | POA: Diagnosis not present

## 2017-01-14 DIAGNOSIS — M1711 Unilateral primary osteoarthritis, right knee: Secondary | ICD-10-CM | POA: Diagnosis not present

## 2017-01-14 DIAGNOSIS — M79661 Pain in right lower leg: Secondary | ICD-10-CM | POA: Diagnosis not present

## 2017-01-19 DIAGNOSIS — R252 Cramp and spasm: Secondary | ICD-10-CM | POA: Diagnosis not present

## 2017-01-19 DIAGNOSIS — I129 Hypertensive chronic kidney disease with stage 1 through stage 4 chronic kidney disease, or unspecified chronic kidney disease: Secondary | ICD-10-CM | POA: Diagnosis not present

## 2017-01-19 DIAGNOSIS — E871 Hypo-osmolality and hyponatremia: Secondary | ICD-10-CM | POA: Diagnosis not present

## 2017-01-19 DIAGNOSIS — E039 Hypothyroidism, unspecified: Secondary | ICD-10-CM | POA: Diagnosis not present

## 2017-01-19 DIAGNOSIS — N189 Chronic kidney disease, unspecified: Secondary | ICD-10-CM | POA: Diagnosis not present

## 2017-01-19 DIAGNOSIS — E876 Hypokalemia: Secondary | ICD-10-CM | POA: Diagnosis not present

## 2017-01-19 DIAGNOSIS — M79604 Pain in right leg: Secondary | ICD-10-CM | POA: Diagnosis not present

## 2017-01-19 DIAGNOSIS — E1122 Type 2 diabetes mellitus with diabetic chronic kidney disease: Secondary | ICD-10-CM | POA: Diagnosis not present

## 2017-01-19 DIAGNOSIS — E785 Hyperlipidemia, unspecified: Secondary | ICD-10-CM | POA: Diagnosis not present

## 2017-01-20 DIAGNOSIS — R252 Cramp and spasm: Secondary | ICD-10-CM | POA: Diagnosis not present

## 2017-01-28 DIAGNOSIS — M79661 Pain in right lower leg: Secondary | ICD-10-CM | POA: Diagnosis not present

## 2017-01-28 DIAGNOSIS — N184 Chronic kidney disease, stage 4 (severe): Secondary | ICD-10-CM | POA: Diagnosis not present

## 2017-01-28 DIAGNOSIS — E039 Hypothyroidism, unspecified: Secondary | ICD-10-CM | POA: Diagnosis not present

## 2017-01-28 DIAGNOSIS — I10 Essential (primary) hypertension: Secondary | ICD-10-CM | POA: Diagnosis not present

## 2017-01-28 DIAGNOSIS — E1122 Type 2 diabetes mellitus with diabetic chronic kidney disease: Secondary | ICD-10-CM | POA: Diagnosis not present

## 2017-01-28 DIAGNOSIS — L723 Sebaceous cyst: Secondary | ICD-10-CM | POA: Diagnosis not present

## 2017-01-28 DIAGNOSIS — R252 Cramp and spasm: Secondary | ICD-10-CM | POA: Diagnosis not present

## 2017-02-03 DIAGNOSIS — M7661 Achilles tendinitis, right leg: Secondary | ICD-10-CM | POA: Diagnosis not present

## 2017-02-03 DIAGNOSIS — R252 Cramp and spasm: Secondary | ICD-10-CM | POA: Diagnosis not present

## 2017-02-03 DIAGNOSIS — M79661 Pain in right lower leg: Secondary | ICD-10-CM | POA: Diagnosis not present

## 2017-02-05 DIAGNOSIS — L02212 Cutaneous abscess of back [any part, except buttock]: Secondary | ICD-10-CM | POA: Diagnosis not present

## 2017-02-06 DIAGNOSIS — Z48817 Encounter for surgical aftercare following surgery on the skin and subcutaneous tissue: Secondary | ICD-10-CM | POA: Diagnosis not present

## 2017-02-06 DIAGNOSIS — L02212 Cutaneous abscess of back [any part, except buttock]: Secondary | ICD-10-CM | POA: Diagnosis not present

## 2017-02-07 DIAGNOSIS — Z48817 Encounter for surgical aftercare following surgery on the skin and subcutaneous tissue: Secondary | ICD-10-CM | POA: Diagnosis not present

## 2017-02-07 DIAGNOSIS — L02212 Cutaneous abscess of back [any part, except buttock]: Secondary | ICD-10-CM | POA: Diagnosis not present

## 2017-02-08 DIAGNOSIS — L02212 Cutaneous abscess of back [any part, except buttock]: Secondary | ICD-10-CM | POA: Diagnosis not present

## 2017-02-08 DIAGNOSIS — Z48817 Encounter for surgical aftercare following surgery on the skin and subcutaneous tissue: Secondary | ICD-10-CM | POA: Diagnosis not present

## 2017-02-09 DIAGNOSIS — L02212 Cutaneous abscess of back [any part, except buttock]: Secondary | ICD-10-CM | POA: Diagnosis not present

## 2017-02-09 DIAGNOSIS — Z48817 Encounter for surgical aftercare following surgery on the skin and subcutaneous tissue: Secondary | ICD-10-CM | POA: Diagnosis not present

## 2017-02-10 DIAGNOSIS — L02212 Cutaneous abscess of back [any part, except buttock]: Secondary | ICD-10-CM | POA: Diagnosis not present

## 2017-02-10 DIAGNOSIS — Z48817 Encounter for surgical aftercare following surgery on the skin and subcutaneous tissue: Secondary | ICD-10-CM | POA: Diagnosis not present

## 2017-02-11 DIAGNOSIS — L02212 Cutaneous abscess of back [any part, except buttock]: Secondary | ICD-10-CM | POA: Diagnosis not present

## 2017-02-11 DIAGNOSIS — Z48817 Encounter for surgical aftercare following surgery on the skin and subcutaneous tissue: Secondary | ICD-10-CM | POA: Diagnosis not present

## 2017-02-13 DIAGNOSIS — L02212 Cutaneous abscess of back [any part, except buttock]: Secondary | ICD-10-CM | POA: Diagnosis not present

## 2017-02-13 DIAGNOSIS — Z48817 Encounter for surgical aftercare following surgery on the skin and subcutaneous tissue: Secondary | ICD-10-CM | POA: Diagnosis not present

## 2017-02-14 DIAGNOSIS — L02212 Cutaneous abscess of back [any part, except buttock]: Secondary | ICD-10-CM | POA: Diagnosis not present

## 2017-02-14 DIAGNOSIS — Z48817 Encounter for surgical aftercare following surgery on the skin and subcutaneous tissue: Secondary | ICD-10-CM | POA: Diagnosis not present

## 2017-02-15 DIAGNOSIS — Z48817 Encounter for surgical aftercare following surgery on the skin and subcutaneous tissue: Secondary | ICD-10-CM | POA: Diagnosis not present

## 2017-02-15 DIAGNOSIS — L02212 Cutaneous abscess of back [any part, except buttock]: Secondary | ICD-10-CM | POA: Diagnosis not present

## 2017-02-16 DIAGNOSIS — L02212 Cutaneous abscess of back [any part, except buttock]: Secondary | ICD-10-CM | POA: Diagnosis not present

## 2017-02-16 DIAGNOSIS — Z48817 Encounter for surgical aftercare following surgery on the skin and subcutaneous tissue: Secondary | ICD-10-CM | POA: Diagnosis not present

## 2017-02-17 DIAGNOSIS — Z48817 Encounter for surgical aftercare following surgery on the skin and subcutaneous tissue: Secondary | ICD-10-CM | POA: Diagnosis not present

## 2017-02-17 DIAGNOSIS — L02212 Cutaneous abscess of back [any part, except buttock]: Secondary | ICD-10-CM | POA: Diagnosis not present

## 2017-02-18 DIAGNOSIS — L02212 Cutaneous abscess of back [any part, except buttock]: Secondary | ICD-10-CM | POA: Diagnosis not present

## 2017-02-18 DIAGNOSIS — Z48817 Encounter for surgical aftercare following surgery on the skin and subcutaneous tissue: Secondary | ICD-10-CM | POA: Diagnosis not present

## 2017-02-19 DIAGNOSIS — M10372 Gout due to renal impairment, left ankle and foot: Secondary | ICD-10-CM | POA: Diagnosis not present

## 2017-02-19 DIAGNOSIS — N184 Chronic kidney disease, stage 4 (severe): Secondary | ICD-10-CM | POA: Diagnosis not present

## 2017-02-19 DIAGNOSIS — E039 Hypothyroidism, unspecified: Secondary | ICD-10-CM | POA: Diagnosis not present

## 2017-02-19 DIAGNOSIS — E1122 Type 2 diabetes mellitus with diabetic chronic kidney disease: Secondary | ICD-10-CM | POA: Diagnosis not present

## 2017-02-19 DIAGNOSIS — M10371 Gout due to renal impairment, right ankle and foot: Secondary | ICD-10-CM | POA: Diagnosis not present

## 2017-02-19 DIAGNOSIS — I10 Essential (primary) hypertension: Secondary | ICD-10-CM | POA: Diagnosis not present

## 2017-02-19 DIAGNOSIS — L02212 Cutaneous abscess of back [any part, except buttock]: Secondary | ICD-10-CM | POA: Diagnosis not present

## 2017-02-19 DIAGNOSIS — Z48817 Encounter for surgical aftercare following surgery on the skin and subcutaneous tissue: Secondary | ICD-10-CM | POA: Diagnosis not present

## 2017-02-19 DIAGNOSIS — E782 Mixed hyperlipidemia: Secondary | ICD-10-CM | POA: Diagnosis not present

## 2017-02-19 DIAGNOSIS — E1165 Type 2 diabetes mellitus with hyperglycemia: Secondary | ICD-10-CM | POA: Diagnosis not present

## 2017-02-20 DIAGNOSIS — L02212 Cutaneous abscess of back [any part, except buttock]: Secondary | ICD-10-CM | POA: Diagnosis not present

## 2017-02-20 DIAGNOSIS — Z48817 Encounter for surgical aftercare following surgery on the skin and subcutaneous tissue: Secondary | ICD-10-CM | POA: Diagnosis not present

## 2017-02-21 DIAGNOSIS — L02212 Cutaneous abscess of back [any part, except buttock]: Secondary | ICD-10-CM | POA: Diagnosis not present

## 2017-02-21 DIAGNOSIS — Z48817 Encounter for surgical aftercare following surgery on the skin and subcutaneous tissue: Secondary | ICD-10-CM | POA: Diagnosis not present

## 2017-02-22 DIAGNOSIS — L02212 Cutaneous abscess of back [any part, except buttock]: Secondary | ICD-10-CM | POA: Diagnosis not present

## 2017-02-22 DIAGNOSIS — Z48817 Encounter for surgical aftercare following surgery on the skin and subcutaneous tissue: Secondary | ICD-10-CM | POA: Diagnosis not present

## 2017-02-23 DIAGNOSIS — Z48817 Encounter for surgical aftercare following surgery on the skin and subcutaneous tissue: Secondary | ICD-10-CM | POA: Diagnosis not present

## 2017-02-23 DIAGNOSIS — L02212 Cutaneous abscess of back [any part, except buttock]: Secondary | ICD-10-CM | POA: Diagnosis not present

## 2017-02-24 DIAGNOSIS — I10 Essential (primary) hypertension: Secondary | ICD-10-CM | POA: Diagnosis not present

## 2017-02-24 DIAGNOSIS — Z0001 Encounter for general adult medical examination with abnormal findings: Secondary | ICD-10-CM | POA: Diagnosis not present

## 2017-02-24 DIAGNOSIS — Z720 Tobacco use: Secondary | ICD-10-CM | POA: Diagnosis not present

## 2017-02-24 DIAGNOSIS — L02212 Cutaneous abscess of back [any part, except buttock]: Secondary | ICD-10-CM | POA: Diagnosis not present

## 2017-02-24 DIAGNOSIS — Z6841 Body Mass Index (BMI) 40.0 and over, adult: Secondary | ICD-10-CM | POA: Diagnosis not present

## 2017-02-24 DIAGNOSIS — E1165 Type 2 diabetes mellitus with hyperglycemia: Secondary | ICD-10-CM | POA: Diagnosis not present

## 2017-02-24 DIAGNOSIS — Z48817 Encounter for surgical aftercare following surgery on the skin and subcutaneous tissue: Secondary | ICD-10-CM | POA: Diagnosis not present

## 2017-02-25 DIAGNOSIS — Z48817 Encounter for surgical aftercare following surgery on the skin and subcutaneous tissue: Secondary | ICD-10-CM | POA: Diagnosis not present

## 2017-02-25 DIAGNOSIS — L02212 Cutaneous abscess of back [any part, except buttock]: Secondary | ICD-10-CM | POA: Diagnosis not present

## 2017-03-03 DIAGNOSIS — L02212 Cutaneous abscess of back [any part, except buttock]: Secondary | ICD-10-CM | POA: Insufficient documentation

## 2017-03-06 DIAGNOSIS — L723 Sebaceous cyst: Secondary | ICD-10-CM | POA: Insufficient documentation

## 2017-03-14 DIAGNOSIS — L723 Sebaceous cyst: Secondary | ICD-10-CM | POA: Diagnosis not present

## 2017-03-14 DIAGNOSIS — N189 Chronic kidney disease, unspecified: Secondary | ICD-10-CM | POA: Diagnosis not present

## 2017-03-14 DIAGNOSIS — I129 Hypertensive chronic kidney disease with stage 1 through stage 4 chronic kidney disease, or unspecified chronic kidney disease: Secondary | ICD-10-CM | POA: Diagnosis not present

## 2017-03-14 DIAGNOSIS — E785 Hyperlipidemia, unspecified: Secondary | ICD-10-CM | POA: Diagnosis not present

## 2017-03-14 DIAGNOSIS — L720 Epidermal cyst: Secondary | ICD-10-CM | POA: Diagnosis not present

## 2017-03-14 DIAGNOSIS — E1122 Type 2 diabetes mellitus with diabetic chronic kidney disease: Secondary | ICD-10-CM | POA: Diagnosis not present

## 2017-03-14 DIAGNOSIS — E039 Hypothyroidism, unspecified: Secondary | ICD-10-CM | POA: Diagnosis not present

## 2017-03-18 DIAGNOSIS — I129 Hypertensive chronic kidney disease with stage 1 through stage 4 chronic kidney disease, or unspecified chronic kidney disease: Secondary | ICD-10-CM | POA: Diagnosis not present

## 2017-03-18 DIAGNOSIS — L723 Sebaceous cyst: Secondary | ICD-10-CM | POA: Diagnosis not present

## 2017-03-18 DIAGNOSIS — N189 Chronic kidney disease, unspecified: Secondary | ICD-10-CM | POA: Diagnosis not present

## 2017-03-18 DIAGNOSIS — L720 Epidermal cyst: Secondary | ICD-10-CM | POA: Diagnosis not present

## 2017-03-18 DIAGNOSIS — E785 Hyperlipidemia, unspecified: Secondary | ICD-10-CM | POA: Diagnosis not present

## 2017-03-18 DIAGNOSIS — E039 Hypothyroidism, unspecified: Secondary | ICD-10-CM | POA: Diagnosis not present

## 2017-03-18 DIAGNOSIS — E1122 Type 2 diabetes mellitus with diabetic chronic kidney disease: Secondary | ICD-10-CM | POA: Diagnosis not present

## 2017-05-22 DIAGNOSIS — E782 Mixed hyperlipidemia: Secondary | ICD-10-CM | POA: Diagnosis not present

## 2017-05-22 DIAGNOSIS — E039 Hypothyroidism, unspecified: Secondary | ICD-10-CM | POA: Diagnosis not present

## 2017-05-22 DIAGNOSIS — I10 Essential (primary) hypertension: Secondary | ICD-10-CM | POA: Diagnosis not present

## 2017-05-22 DIAGNOSIS — E1122 Type 2 diabetes mellitus with diabetic chronic kidney disease: Secondary | ICD-10-CM | POA: Diagnosis not present

## 2017-05-22 DIAGNOSIS — N184 Chronic kidney disease, stage 4 (severe): Secondary | ICD-10-CM | POA: Diagnosis not present

## 2017-05-22 DIAGNOSIS — E1165 Type 2 diabetes mellitus with hyperglycemia: Secondary | ICD-10-CM | POA: Diagnosis not present

## 2017-05-28 DIAGNOSIS — I10 Essential (primary) hypertension: Secondary | ICD-10-CM | POA: Diagnosis not present

## 2017-05-28 DIAGNOSIS — E1122 Type 2 diabetes mellitus with diabetic chronic kidney disease: Secondary | ICD-10-CM | POA: Diagnosis not present

## 2017-05-28 DIAGNOSIS — E039 Hypothyroidism, unspecified: Secondary | ICD-10-CM | POA: Diagnosis not present

## 2017-05-28 DIAGNOSIS — E782 Mixed hyperlipidemia: Secondary | ICD-10-CM | POA: Diagnosis not present

## 2017-05-28 DIAGNOSIS — Z23 Encounter for immunization: Secondary | ICD-10-CM | POA: Diagnosis not present

## 2017-05-28 DIAGNOSIS — M79661 Pain in right lower leg: Secondary | ICD-10-CM | POA: Diagnosis not present

## 2017-05-28 DIAGNOSIS — N184 Chronic kidney disease, stage 4 (severe): Secondary | ICD-10-CM | POA: Diagnosis not present

## 2017-06-10 DIAGNOSIS — M79661 Pain in right lower leg: Secondary | ICD-10-CM | POA: Diagnosis not present

## 2017-06-10 DIAGNOSIS — R262 Difficulty in walking, not elsewhere classified: Secondary | ICD-10-CM | POA: Diagnosis not present

## 2017-06-13 DIAGNOSIS — M79661 Pain in right lower leg: Secondary | ICD-10-CM | POA: Diagnosis not present

## 2017-06-13 DIAGNOSIS — R262 Difficulty in walking, not elsewhere classified: Secondary | ICD-10-CM | POA: Diagnosis not present

## 2017-06-17 DIAGNOSIS — R262 Difficulty in walking, not elsewhere classified: Secondary | ICD-10-CM | POA: Diagnosis not present

## 2017-06-17 DIAGNOSIS — M79661 Pain in right lower leg: Secondary | ICD-10-CM | POA: Diagnosis not present

## 2017-06-20 DIAGNOSIS — R262 Difficulty in walking, not elsewhere classified: Secondary | ICD-10-CM | POA: Diagnosis not present

## 2017-06-20 DIAGNOSIS — M79661 Pain in right lower leg: Secondary | ICD-10-CM | POA: Diagnosis not present

## 2017-07-04 DIAGNOSIS — M79661 Pain in right lower leg: Secondary | ICD-10-CM | POA: Diagnosis not present

## 2017-07-04 DIAGNOSIS — R262 Difficulty in walking, not elsewhere classified: Secondary | ICD-10-CM | POA: Diagnosis not present

## 2017-07-11 DIAGNOSIS — M79661 Pain in right lower leg: Secondary | ICD-10-CM | POA: Diagnosis not present

## 2017-07-11 DIAGNOSIS — R262 Difficulty in walking, not elsewhere classified: Secondary | ICD-10-CM | POA: Diagnosis not present

## 2017-07-15 HISTORY — PX: BACK SURGERY: SHX140

## 2017-07-16 DIAGNOSIS — N184 Chronic kidney disease, stage 4 (severe): Secondary | ICD-10-CM | POA: Diagnosis not present

## 2017-07-16 DIAGNOSIS — M545 Low back pain: Secondary | ICD-10-CM | POA: Diagnosis not present

## 2017-07-16 DIAGNOSIS — E1122 Type 2 diabetes mellitus with diabetic chronic kidney disease: Secondary | ICD-10-CM | POA: Diagnosis not present

## 2017-07-16 DIAGNOSIS — M5416 Radiculopathy, lumbar region: Secondary | ICD-10-CM | POA: Diagnosis not present

## 2017-07-16 DIAGNOSIS — M79661 Pain in right lower leg: Secondary | ICD-10-CM | POA: Diagnosis not present

## 2017-07-16 DIAGNOSIS — I10 Essential (primary) hypertension: Secondary | ICD-10-CM | POA: Diagnosis not present

## 2017-07-31 ENCOUNTER — Other Ambulatory Visit: Payer: Self-pay | Admitting: Family Medicine

## 2017-07-31 DIAGNOSIS — M545 Low back pain: Secondary | ICD-10-CM

## 2017-08-09 ENCOUNTER — Ambulatory Visit
Admission: RE | Admit: 2017-08-09 | Discharge: 2017-08-09 | Disposition: A | Discharge status: Home / Self Care | Payer: Commercial Managed Care - HMO | Source: Ambulatory Visit | Attending: Family Medicine | Admitting: Family Medicine

## 2017-08-09 DIAGNOSIS — M48061 Spinal stenosis, lumbar region without neurogenic claudication: Secondary | ICD-10-CM | POA: Diagnosis not present

## 2017-08-09 DIAGNOSIS — M545 Low back pain: Secondary | ICD-10-CM

## 2017-08-25 DIAGNOSIS — M5416 Radiculopathy, lumbar region: Secondary | ICD-10-CM | POA: Diagnosis not present

## 2017-08-25 DIAGNOSIS — I10 Essential (primary) hypertension: Secondary | ICD-10-CM | POA: Diagnosis not present

## 2017-08-25 DIAGNOSIS — Z6841 Body Mass Index (BMI) 40.0 and over, adult: Secondary | ICD-10-CM | POA: Diagnosis not present

## 2017-08-28 DIAGNOSIS — E1165 Type 2 diabetes mellitus with hyperglycemia: Secondary | ICD-10-CM | POA: Diagnosis not present

## 2017-08-28 DIAGNOSIS — E782 Mixed hyperlipidemia: Secondary | ICD-10-CM | POA: Diagnosis not present

## 2017-08-28 DIAGNOSIS — N184 Chronic kidney disease, stage 4 (severe): Secondary | ICD-10-CM | POA: Diagnosis not present

## 2017-08-28 DIAGNOSIS — E1122 Type 2 diabetes mellitus with diabetic chronic kidney disease: Secondary | ICD-10-CM | POA: Diagnosis not present

## 2017-08-28 DIAGNOSIS — I10 Essential (primary) hypertension: Secondary | ICD-10-CM | POA: Diagnosis not present

## 2017-08-28 DIAGNOSIS — E876 Hypokalemia: Secondary | ICD-10-CM | POA: Diagnosis not present

## 2017-09-01 DIAGNOSIS — E1122 Type 2 diabetes mellitus with diabetic chronic kidney disease: Secondary | ICD-10-CM | POA: Diagnosis not present

## 2017-09-01 DIAGNOSIS — N184 Chronic kidney disease, stage 4 (severe): Secondary | ICD-10-CM | POA: Diagnosis not present

## 2017-09-01 DIAGNOSIS — I10 Essential (primary) hypertension: Secondary | ICD-10-CM | POA: Diagnosis not present

## 2017-09-01 DIAGNOSIS — Z6841 Body Mass Index (BMI) 40.0 and over, adult: Secondary | ICD-10-CM | POA: Diagnosis not present

## 2017-09-01 DIAGNOSIS — M1711 Unilateral primary osteoarthritis, right knee: Secondary | ICD-10-CM | POA: Diagnosis not present

## 2017-09-01 DIAGNOSIS — M10372 Gout due to renal impairment, left ankle and foot: Secondary | ICD-10-CM | POA: Diagnosis not present

## 2017-09-01 DIAGNOSIS — E039 Hypothyroidism, unspecified: Secondary | ICD-10-CM | POA: Diagnosis not present

## 2017-09-30 DIAGNOSIS — M5416 Radiculopathy, lumbar region: Secondary | ICD-10-CM | POA: Diagnosis not present

## 2017-10-20 DIAGNOSIS — M4726 Other spondylosis with radiculopathy, lumbar region: Secondary | ICD-10-CM | POA: Diagnosis not present

## 2017-10-20 DIAGNOSIS — I10 Essential (primary) hypertension: Secondary | ICD-10-CM | POA: Diagnosis not present

## 2017-10-20 DIAGNOSIS — Z6841 Body Mass Index (BMI) 40.0 and over, adult: Secondary | ICD-10-CM | POA: Diagnosis not present

## 2017-11-11 DIAGNOSIS — M5416 Radiculopathy, lumbar region: Secondary | ICD-10-CM | POA: Diagnosis not present

## 2017-11-11 DIAGNOSIS — M4726 Other spondylosis with radiculopathy, lumbar region: Secondary | ICD-10-CM | POA: Diagnosis not present

## 2017-11-26 DIAGNOSIS — I10 Essential (primary) hypertension: Secondary | ICD-10-CM | POA: Diagnosis not present

## 2017-11-26 DIAGNOSIS — E1165 Type 2 diabetes mellitus with hyperglycemia: Secondary | ICD-10-CM | POA: Diagnosis not present

## 2017-11-26 DIAGNOSIS — E782 Mixed hyperlipidemia: Secondary | ICD-10-CM | POA: Diagnosis not present

## 2017-11-26 DIAGNOSIS — E876 Hypokalemia: Secondary | ICD-10-CM | POA: Diagnosis not present

## 2017-11-26 DIAGNOSIS — E1122 Type 2 diabetes mellitus with diabetic chronic kidney disease: Secondary | ICD-10-CM | POA: Diagnosis not present

## 2017-11-28 DIAGNOSIS — N184 Chronic kidney disease, stage 4 (severe): Secondary | ICD-10-CM | POA: Diagnosis not present

## 2017-11-28 DIAGNOSIS — E1122 Type 2 diabetes mellitus with diabetic chronic kidney disease: Secondary | ICD-10-CM | POA: Diagnosis not present

## 2017-11-28 DIAGNOSIS — I10 Essential (primary) hypertension: Secondary | ICD-10-CM | POA: Diagnosis not present

## 2017-11-28 DIAGNOSIS — E039 Hypothyroidism, unspecified: Secondary | ICD-10-CM | POA: Diagnosis not present

## 2017-11-28 DIAGNOSIS — M10372 Gout due to renal impairment, left ankle and foot: Secondary | ICD-10-CM | POA: Diagnosis not present

## 2017-11-28 DIAGNOSIS — Z6841 Body Mass Index (BMI) 40.0 and over, adult: Secondary | ICD-10-CM | POA: Diagnosis not present

## 2017-12-04 DIAGNOSIS — I517 Cardiomegaly: Secondary | ICD-10-CM | POA: Diagnosis not present

## 2017-12-04 DIAGNOSIS — R011 Cardiac murmur, unspecified: Secondary | ICD-10-CM | POA: Diagnosis not present

## 2017-12-04 DIAGNOSIS — I272 Pulmonary hypertension, unspecified: Secondary | ICD-10-CM | POA: Diagnosis not present

## 2017-12-04 DIAGNOSIS — I358 Other nonrheumatic aortic valve disorders: Secondary | ICD-10-CM | POA: Diagnosis not present

## 2017-12-10 DIAGNOSIS — Z6841 Body Mass Index (BMI) 40.0 and over, adult: Secondary | ICD-10-CM | POA: Diagnosis not present

## 2017-12-10 DIAGNOSIS — I10 Essential (primary) hypertension: Secondary | ICD-10-CM | POA: Diagnosis not present

## 2017-12-10 DIAGNOSIS — M4726 Other spondylosis with radiculopathy, lumbar region: Secondary | ICD-10-CM | POA: Diagnosis not present

## 2017-12-26 ENCOUNTER — Other Ambulatory Visit: Payer: Self-pay | Admitting: Neurosurgery

## 2018-01-07 ENCOUNTER — Other Ambulatory Visit: Payer: Self-pay | Admitting: Neurosurgery

## 2018-01-08 NOTE — Pre-Procedure Instructions (Addendum)
Joan Torres  01/08/2018      [facility] [address], [place] [address] Phone: [phone] Fax: [phone]    Your procedure is scheduled on July 9th at 10:45.  Report to [facility] Admitting at 8:45 A.M.  Call this number if you have problems the morning of surgery:  [phone]   Remember:  Do not eat or drink after midnight.   Continue all medications as directed by your physician except follow these medication instructions before surgery below     Take these medicines the morning of surgery with A SIP OF WATER   amLODipine (NORVASC) 5 MG tablet  CARTIA XT 180 MG 24 hr capsule  levothyroxine (SYNTHROID, LEVOTHROID) 125 MCG tablet   Follow your doctors instructions regarding your Aspirin.  If no instructions were given by your doctor, then you will need to call the prescribing office office to get instructions.    7 days prior to surgery STOP taking any Aspirin(unless otherwise instructed by your surgeon), Aleve, Naproxen, Ibuprofen, Motrin, Advil, Goody's, BC's, all herbal medications, fish oil, and all vitamins   WHAT DO I DO ABOUT MY DIABETES MEDICATION?   Marland Kitchen Do not take oral diabetes medicines (pills) the morning of surgery.      THE MORNING OF SURGERY,  DO NOT TAKE YOUR glipiZIDE (Glucotrol) 10mg  tablet or metFORMIN (GLUCOPHAGE) 1000mg  tablet.  . The day of surgery, do not take other diabetes injectables, including Byetta (exenatide), Bydureon (exenatide ER), Victoza (liraglutide), or Trulicity (dulaglutide).  . If your CBG is greater than 220 mg/dL, you may take  of your sliding scale (correction) dose of insulin.   How to Manage Your Diabetes Before and After Surgery  Why is it important to control my blood sugar before and after surgery? . Improving blood sugar levels before and after surgery helps healing and can limit problems. . A way of improving blood sugar control is eating a healthy diet  by: o  Eating less sugar and carbohydrates o  Increasing activity/exercise o  Talking with your doctor about reaching your blood sugar goals . High blood sugars (greater than 180 mg/dL) can raise your risk of infections and slow your recovery, so you will need to focus on controlling your diabetes during the weeks before surgery. . Make sure that the doctor who takes care of your diabetes knows about your planned surgery including the date and location.  How do I manage my blood sugar before surgery? . Check your blood sugar at least 4 times a day, starting 2 days before surgery, to make sure that the level is not too high or low. o Check your blood sugar the morning of your surgery when you wake up and every 2 hours until you get to the Short Stay unit. . If your blood sugar is less than 70 mg/dL, you will need to treat for low blood sugar: o Do not take insulin. o Treat a low blood sugar (less than 70 mg/dL) with  cup of clear juice (cranberry or apple), 4 glucose tablets, OR glucose gel. o Recheck blood sugar in 15 minutes after treatment (to make sure it is greater than 70 mg/dL). If your blood sugar is not greater than 70 mg/dL on recheck, call [phone] for further instructions. . Report your blood sugar to the short stay nurse when you get to Short Stay.  . If you are admitted to the hospital after  surgery: o Your blood sugar will be checked by the staff and you will probably be given insulin after surgery (instead of oral diabetes medicines) to make sure you have good blood sugar levels. o The goal for blood sugar control after surgery is 80-180 mg/dL.    Do not wear jewelry, make-up or nail polish.  Do not wear lotions, powders, or perfumes, or deodorant.  Do not shave 48 hours prior to surgery.    Do not bring valuables to the hospital.   [facility] is not responsible for any belongings or valuables.  Eyeglasses, contacts, hearing aids, dentures or bridgework may not be worn  into surgery.  Leave your suitcase in the car.  After surgery it may be brought to your room.  For patients admitted to the hospital, discharge time will be determined by your treatment team.  Patients discharged the day of surgery will not be allowed to drive home.    [facility]- Preparing For Surgery  Before surgery, you can play an important role. Because skin is not sterile, your skin needs to be as free of germs as possible. You can reduce the number of germs on your skin by washing with CHG (chlorahexidine gluconate) Soap before surgery.  CHG is an antiseptic cleaner which kills germs and bonds with the skin to continue killing germs even after washing.    Oral Hygiene is also important to reduce your risk of infection.  Remember - BRUSH YOUR TEETH THE MORNING OF SURGERY WITH YOUR REGULAR TOOTHPASTE  Please do not use if you have an allergy to CHG or antibacterial soaps. If your skin becomes reddened/irritated stop using the CHG.  Do not shave (including legs and underarms) for at least 48 hours prior to first CHG shower. It is OK to shave your face.  Please follow these instructions carefully.   1. Shower the NIGHT BEFORE SURGERY and the MORNING OF SURGERY with CHG.   2. If you chose to wash your hair, wash your hair first as usual with your normal shampoo.  3. After you shampoo, rinse your hair and body thoroughly to remove the shampoo.  4. Use CHG as you would any other liquid soap. You can apply CHG directly to the skin and wash gently with a scrungie or a clean washcloth.   5. Apply the CHG Soap to your body ONLY FROM THE NECK DOWN.  Do not use on open wounds or open sores. Avoid contact with your eyes, ears, mouth and genitals (private parts). Wash Face and genitals (private parts)  with your normal soap.  6. Wash thoroughly, paying special attention to the area where your surgery will be performed.  7. Thoroughly rinse your body with warm water from the neck down.  8. DO  NOT shower/wash with your normal soap after using and rinsing off the CHG Soap.  9. Pat yourself dry with a CLEAN TOWEL.  10. Wear CLEAN PAJAMAS to bed the night before surgery, wear comfortable clothes the morning of surgery  11. Place CLEAN SHEETS on your bed the night of your first shower and DO NOT SLEEP WITH PETS.    Day of Surgery:  Do not apply any deodorants/lotions.  Please wear clean clothes to the hospital/surgery center.   Remember to brush your teeth WITH YOUR REGULAR TOOTHPASTE.  Please read over the following fact sheets that you were given. Pain Booklet, Coughing and Deep Breathing and Surgical Site Infection Prevention

## 2018-01-09 ENCOUNTER — Other Ambulatory Visit: Payer: Self-pay

## 2018-01-09 ENCOUNTER — Encounter (HOSPITAL_COMMUNITY)
Admission: RE | Admit: 2018-01-09 | Discharge: 2018-01-09 | Disposition: A | Discharge status: Home / Self Care | Payer: Medicare HMO | Source: Ambulatory Visit | Attending: Neurosurgery | Admitting: Neurosurgery

## 2018-01-09 ENCOUNTER — Encounter (HOSPITAL_COMMUNITY): Payer: Self-pay

## 2018-01-09 DIAGNOSIS — M4726 Other spondylosis with radiculopathy, lumbar region: Secondary | ICD-10-CM | POA: Insufficient documentation

## 2018-01-09 DIAGNOSIS — Z79899 Other long term (current) drug therapy: Secondary | ICD-10-CM | POA: Diagnosis not present

## 2018-01-09 DIAGNOSIS — Z7982 Long term (current) use of aspirin: Secondary | ICD-10-CM | POA: Insufficient documentation

## 2018-01-09 DIAGNOSIS — Z01818 Encounter for other preprocedural examination: Secondary | ICD-10-CM | POA: Insufficient documentation

## 2018-01-09 DIAGNOSIS — Z7984 Long term (current) use of oral hypoglycemic drugs: Secondary | ICD-10-CM | POA: Diagnosis not present

## 2018-01-09 DIAGNOSIS — E119 Type 2 diabetes mellitus without complications: Secondary | ICD-10-CM | POA: Diagnosis not present

## 2018-01-09 DIAGNOSIS — I10 Essential (primary) hypertension: Secondary | ICD-10-CM | POA: Insufficient documentation

## 2018-01-09 DIAGNOSIS — E039 Hypothyroidism, unspecified: Secondary | ICD-10-CM | POA: Insufficient documentation

## 2018-01-09 DIAGNOSIS — Z79890 Hormone replacement therapy (postmenopausal): Secondary | ICD-10-CM | POA: Diagnosis not present

## 2018-01-09 HISTORY — DX: Hypothyroidism, unspecified: E03.9

## 2018-01-09 LAB — CBC
HEMATOCRIT: 39.7 % (ref 36.0–46.0)
HEMOGLOBIN: 12.7 g/dL (ref 12.0–15.0)
MCH: 29.4 pg (ref 26.0–34.0)
MCHC: 32 g/dL (ref 30.0–36.0)
MCV: 91.9 fL (ref 78.0–100.0)
Platelets: 342 10*3/uL (ref 150–400)
RBC: 4.32 MIL/uL (ref 3.87–5.11)
RDW: 13.5 % (ref 11.5–15.5)
WBC: 7.6 10*3/uL (ref 4.0–10.5)

## 2018-01-09 LAB — GLUCOSE, CAPILLARY: GLUCOSE-CAPILLARY: 183 mg/dL — AB (ref 70–99)

## 2018-01-09 LAB — BASIC METABOLIC PANEL
ANION GAP: 11 (ref 5–15)
BUN: 13 mg/dL (ref 8–23)
CO2: 24 mmol/L (ref 22–32)
Calcium: 10.4 mg/dL — ABNORMAL HIGH (ref 8.9–10.3)
Chloride: 104 mmol/L (ref 98–111)
Creatinine, Ser: 1.38 mg/dL — ABNORMAL HIGH (ref 0.44–1.00)
GFR calc Af Amer: 43 mL/min — ABNORMAL LOW (ref >60)
GFR calc non Af Amer: 37 mL/min — ABNORMAL LOW (ref >60)
GLUCOSE: 118 mg/dL — AB (ref 70–99)
POTASSIUM: 3.1 mmol/L — AB (ref 3.5–5.1)
Sodium: 139 mmol/L (ref 135–145)

## 2018-01-09 LAB — TYPE AND SCREEN
ABO/RH(D): O POS
ANTIBODY SCREEN: NEGATIVE

## 2018-01-09 LAB — ABO/RH: ABO/RH(D): O POS

## 2018-01-09 LAB — SURGICAL PCR SCREEN
MRSA, PCR: NEGATIVE
Staphylococcus aureus: NEGATIVE

## 2018-01-09 LAB — HEMOGLOBIN A1C
Hgb A1c MFr Bld: 7.2 % — ABNORMAL HIGH (ref 4.8–5.6)
Mean Plasma Glucose: 159.94 mg/dL

## 2018-01-09 NOTE — Progress Notes (Signed)
PCP - Dr. Judd Lien Cardiologist - denies  Chest x-ray - N/A EKG - 01/09/2018 Stress Test - denies  ECHO - denies Cardiac Cath - denies  Sleep Study - Pt states a negative sleep study in the past CPAP - denies  Fasting Blood Sugar - 125 CBG at PAT appointment 183 Checks Blood Sugar 1-2 times a day  Aspirin Instructions: Holding ASA starting July 2nd prior to surgery.   Anesthesia review: Yes  Patient denies shortness of breath, fever, cough and chest pain at PAT appointment   Patient verbalized understanding of instructions that were given to them at the PAT appointment. Patient was also instructed that they will need to review over the PAT instructions again at home before surgery.  Jacqlyn Larsen, RN

## 2018-01-12 NOTE — Progress Notes (Signed)
Anesthesia Chart Review:  Case:  [id number] Date/Time:  01/20/18 1030   Procedure:  DECOMPRESSION AND POSTERIOR LUMBAR INTERBODY FUSION LUMBAR 4- LUMBAR 5 (N/A ) - DECOMPRESSION AND POSTERIOR LUMBAR INTERBODY FUSION LUMBAR 4- LUMBAR 5   Anesthesia type:  General   Pre-op diagnosis:  OSTEOARTHRITIS OF SPINE WITH RADICULOPATHY, LUMBAR REGION   Location:  [facility] OR ROOM 81 / [facility] OR   Surgeon:  Consuella Lose, MD      DISCUSSION: Pt is a 72yo female  Hx of HTN, DMII, morbid obesity scheduled for above procedure. Per PCP office note 11/28/2017 a new RUSB murmur noted, subsequent ECHO done at [facility] reviewed without significant findings - see below for summary.   VS: BP (!) 145/71   Pulse 73   Temp 36.7 C   Resp 18   Wt 261 lb 12.8 oz (118.8 kg)   SpO2 100%   BMI 42.26 kg/m   PROVIDERS: Curlene Labrum, MD   LABS: Labs reviewed: Acceptable for surgery. (all labs ordered are listed, but only abnormal results are displayed)  Labs Reviewed  GLUCOSE, CAPILLARY - Abnormal; Notable for the following components:      Result Value   Glucose-Capillary 183 (*)    All other components within normal limits  HEMOGLOBIN A1C - Abnormal; Notable for the following components:   Hgb A1c MFr Bld 7.2 (*)    All other components within normal limits  BASIC METABOLIC PANEL - Abnormal; Notable for the following components:   Potassium 3.1 (*)    Glucose, Bld 118 (*)    Creatinine, Ser 1.38 (*)    Calcium 10.4 (*)    GFR calc non Af Amer 37 (*)    GFR calc Af Amer 43 (*)    All other components within normal limits  SURGICAL PCR SCREEN  CBC  TYPE AND SCREEN  ABO/RH     IMAGES: N/A   EKG: 01/09/2018 shows NSR   CV: Carotid US 09/25/2009 showed minimal left carotid bulb plaque resulting in less than 50% diameter stenosis.  TTE 12/04/2017:  1.  Mild concentric left ventricular hypertrophy is observed. 2.  There is normal left ventricular systolic function. 3.  The estimated ejection  fraction is 60 to 65%. 4.  The right ventricular global systolic function is normal. 5.  Mild aortic cusp sclerosis is present. 6.  There is evidence of mild pulmonary hypertension. 7.  The right ventricular systolic pressure is estimated to be 35 to 40 mmHg. 8.  The inferior vena cava appears normal in size. 9.  There is less than 50% respiratory change in the inferior vena cava dimension.  Past Medical History:  Diagnosis Date  . Diabetes mellitus without complication ([place])   . Hypertension   . Hypothyroidism     Past Surgical History:  Procedure Laterality Date  . ABDOMINAL HYSTERECTOMY    . APPENDECTOMY    . BREAST SURGERY    . CYST REMOVAL NECK N/A 2018  . HAND SURGERY      MEDICATIONS: . amLODipine (NORVASC) 5 MG tablet  . aspirin EC 81 MG tablet  . Camphor-Eucalyptus-Menthol (VICKS VAPORUB EX)  . CARTIA XT 180 MG 24 hr capsule  . chlorthalidone (HYGROTON) 25 MG tablet  . Cholecalciferol (VITAMIN D3) 2000 units TABS  . furosemide (LASIX) 40 MG tablet  . glipiZIDE (GLUCOTROL) 10 MG tablet  . ipratropium (ATROVENT) 0.03 % nasal spray  . levothyroxine (SYNTHROID, LEVOTHROID) 125 MCG tablet  . metFORMIN (GLUCOPHAGE) 1000 MG tablet  .  pravastatin (PRAVACHOL) 40 MG tablet   No current facility-administered medications for this encounter.      Wynonia Musty [facility] Short Stay Center/Anesthesiology Phone [phone] 01/12/2018 1:20 PM

## 2018-01-20 ENCOUNTER — Encounter (HOSPITAL_COMMUNITY)
Admission: RE | Disposition: A | Discharge status: Home Health | Payer: Self-pay | Source: Home / Self Care | Attending: Neurosurgery

## 2018-01-20 ENCOUNTER — Inpatient Hospital Stay (HOSPITAL_COMMUNITY): Payer: Medicare HMO | Admitting: Physician Assistant

## 2018-01-20 ENCOUNTER — Inpatient Hospital Stay (HOSPITAL_COMMUNITY): Payer: Medicare HMO

## 2018-01-20 ENCOUNTER — Encounter (HOSPITAL_COMMUNITY): Payer: Self-pay

## 2018-01-20 ENCOUNTER — Other Ambulatory Visit: Payer: Self-pay

## 2018-01-20 ENCOUNTER — Inpatient Hospital Stay (HOSPITAL_COMMUNITY)
Admission: RE | Admit: 2018-01-20 | Discharge: 2018-01-21 | DRG: 460 | Disposition: A | Discharge status: Home Health | Payer: Medicare HMO | Attending: Neurosurgery | Admitting: Neurosurgery

## 2018-01-20 DIAGNOSIS — Z7984 Long term (current) use of oral hypoglycemic drugs: Secondary | ICD-10-CM | POA: Diagnosis not present

## 2018-01-20 DIAGNOSIS — M4316 Spondylolisthesis, lumbar region: Secondary | ICD-10-CM

## 2018-01-20 DIAGNOSIS — M1990 Unspecified osteoarthritis, unspecified site: Secondary | ICD-10-CM | POA: Diagnosis present

## 2018-01-20 DIAGNOSIS — M5416 Radiculopathy, lumbar region: Secondary | ICD-10-CM | POA: Diagnosis present

## 2018-01-20 DIAGNOSIS — I10 Essential (primary) hypertension: Secondary | ICD-10-CM | POA: Diagnosis not present

## 2018-01-20 DIAGNOSIS — Z79890 Hormone replacement therapy (postmenopausal): Secondary | ICD-10-CM

## 2018-01-20 DIAGNOSIS — Z7982 Long term (current) use of aspirin: Secondary | ICD-10-CM | POA: Diagnosis not present

## 2018-01-20 DIAGNOSIS — M48061 Spinal stenosis, lumbar region without neurogenic claudication: Principal | ICD-10-CM | POA: Diagnosis present

## 2018-01-20 DIAGNOSIS — Z79899 Other long term (current) drug therapy: Secondary | ICD-10-CM

## 2018-01-20 DIAGNOSIS — M4326 Fusion of spine, lumbar region: Secondary | ICD-10-CM | POA: Diagnosis not present

## 2018-01-20 DIAGNOSIS — Z87891 Personal history of nicotine dependence: Secondary | ICD-10-CM

## 2018-01-20 DIAGNOSIS — Z90710 Acquired absence of both cervix and uterus: Secondary | ICD-10-CM | POA: Diagnosis not present

## 2018-01-20 DIAGNOSIS — E039 Hypothyroidism, unspecified: Secondary | ICD-10-CM | POA: Diagnosis not present

## 2018-01-20 DIAGNOSIS — E119 Type 2 diabetes mellitus without complications: Secondary | ICD-10-CM | POA: Diagnosis not present

## 2018-01-20 DIAGNOSIS — M47816 Spondylosis without myelopathy or radiculopathy, lumbar region: Secondary | ICD-10-CM

## 2018-01-20 LAB — GLUCOSE, CAPILLARY
GLUCOSE-CAPILLARY: 256 mg/dL — AB (ref 70–99)
Glucose-Capillary: 135 mg/dL — ABNORMAL HIGH (ref 70–99)
Glucose-Capillary: 185 mg/dL — ABNORMAL HIGH (ref 70–99)
Glucose-Capillary: 274 mg/dL — ABNORMAL HIGH (ref 70–99)

## 2018-01-20 SURGERY — POSTERIOR LUMBAR FUSION 1 LEVEL
Anesthesia: General

## 2018-01-20 MED ORDER — BISACODYL 10 MG RE SUPP: 10.0000 mg | Freq: Every day | RECTAL | Status: DC | PRN

## 2018-01-20 MED ORDER — ONDANSETRON HCL 4 MG PO TABS: 4.0000 mg | ORAL_TABLET | Freq: Four times a day (QID) | ORAL | Status: DC | PRN

## 2018-01-20 MED ORDER — INSULIN ASPART 100 UNIT/ML ~~LOC~~ SOLN
0.0000 [IU] | Freq: Three times a day (TID) | SUBCUTANEOUS | Status: DC
  Administered 2018-01-20: 11 [IU] via SUBCUTANEOUS
  Administered 2018-01-21: 4 [IU] via SUBCUTANEOUS
  Administered 2018-01-21: 7 [IU] via SUBCUTANEOUS

## 2018-01-20 MED ORDER — DEXAMETHASONE SODIUM PHOSPHATE 10 MG/ML IJ SOLN
INTRAMUSCULAR | Status: DC | PRN
  Administered 2018-01-20: 4 mg via INTRAVENOUS

## 2018-01-20 MED ORDER — CEFAZOLIN SODIUM 1 G IJ SOLR
INTRAMUSCULAR | Status: AC
  Filled 2018-01-20: qty 10

## 2018-01-20 MED ORDER — CEFAZOLIN SODIUM-DEXTROSE 2-4 GM/100ML-% IV SOLN
2.0000 g | Freq: Three times a day (TID) | INTRAVENOUS | Status: AC
  Administered 2018-01-20 (×2): 2 g via INTRAVENOUS
  Filled 2018-01-20 (×2): qty 100

## 2018-01-20 MED ORDER — METFORMIN HCL 500 MG PO TABS
1000.0000 mg | ORAL_TABLET | Freq: Two times a day (BID) | ORAL | Status: DC
  Administered 2018-01-20 – 2018-01-21 (×2): 1000 mg via ORAL
  Filled 2018-01-20 (×2): qty 2

## 2018-01-20 MED ORDER — SODIUM CHLORIDE 0.9 % IV SOLN
INTRAVENOUS | Status: DC
  Administered 2018-01-20: 15:00:00 via INTRAVENOUS

## 2018-01-20 MED ORDER — THROMBIN (RECOMBINANT) 20000 UNITS EX SOLR
CUTANEOUS | Status: AC
  Filled 2018-01-20: qty 20000

## 2018-01-20 MED ORDER — FUROSEMIDE 40 MG PO TABS: 40.0000 mg | ORAL_TABLET | Freq: Every day | ORAL | Status: DC | PRN

## 2018-01-20 MED ORDER — MEPERIDINE HCL 50 MG/ML IJ SOLN: 6.2500 mg | INTRAMUSCULAR | Status: DC | PRN

## 2018-01-20 MED ORDER — LEVOTHYROXINE SODIUM 75 MCG PO TABS
125.0000 ug | ORAL_TABLET | Freq: Every day | ORAL | Status: DC
  Administered 2018-01-21: 125 ug via ORAL
  Filled 2018-01-20: qty 1

## 2018-01-20 MED ORDER — FENTANYL CITRATE (PF) 250 MCG/5ML IJ SOLN
INTRAMUSCULAR | Status: DC | PRN
  Administered 2018-01-20: 50 ug via INTRAVENOUS
  Administered 2018-01-20: 25 ug via INTRAVENOUS
  Administered 2018-01-20 (×2): 50 ug via INTRAVENOUS
  Administered 2018-01-20: 25 ug via INTRAVENOUS
  Administered 2018-01-20: 50 ug via INTRAVENOUS

## 2018-01-20 MED ORDER — HYDROMORPHONE HCL 1 MG/ML IJ SOLN
0.2500 mg | INTRAMUSCULAR | Status: DC | PRN
  Administered 2018-01-20 (×4): 0.5 mg via INTRAVENOUS

## 2018-01-20 MED ORDER — CHLORHEXIDINE GLUCONATE CLOTH 2 % EX PADS: 6.0000 | MEDICATED_PAD | Freq: Once | CUTANEOUS | Status: DC

## 2018-01-20 MED ORDER — CHLORTHALIDONE 25 MG PO TABS
25.0000 mg | ORAL_TABLET | Freq: Every day | ORAL | Status: DC
  Administered 2018-01-21: 25 mg via ORAL
  Filled 2018-01-20: qty 1

## 2018-01-20 MED ORDER — LIDOCAINE 2% (20 MG/ML) 5 ML SYRINGE
INTRAMUSCULAR | Status: AC
  Filled 2018-01-20: qty 5

## 2018-01-20 MED ORDER — IPRATROPIUM BROMIDE 0.03 % NA SOLN
1.0000 | Freq: Two times a day (BID) | NASAL | Status: DC | PRN
  Filled 2018-01-20: qty 30

## 2018-01-20 MED ORDER — ROCURONIUM BROMIDE 10 MG/ML (PF) SYRINGE
PREFILLED_SYRINGE | INTRAVENOUS | Status: AC
  Filled 2018-01-20: qty 10

## 2018-01-20 MED ORDER — AMLODIPINE BESYLATE 5 MG PO TABS
5.0000 mg | ORAL_TABLET | Freq: Every day | ORAL | Status: DC
  Administered 2018-01-21: 5 mg via ORAL
  Filled 2018-01-20: qty 1

## 2018-01-20 MED ORDER — PHENYLEPHRINE HCL 10 MG/ML IJ SOLN
INTRAVENOUS | Status: DC | PRN
  Administered 2018-01-20: 20 ug/min via INTRAVENOUS

## 2018-01-20 MED ORDER — SODIUM CHLORIDE 0.9 % IJ SOLN
INTRAMUSCULAR | Status: AC
  Filled 2018-01-20: qty 10

## 2018-01-20 MED ORDER — ACETAMINOPHEN 325 MG PO TABS: 650.0000 mg | ORAL_TABLET | ORAL | Status: DC | PRN

## 2018-01-20 MED ORDER — 0.9 % SODIUM CHLORIDE (POUR BTL) OPTIME
TOPICAL | Status: DC | PRN
  Administered 2018-01-20: 1000 mL

## 2018-01-20 MED ORDER — GLIPIZIDE 10 MG PO TABS
10.0000 mg | ORAL_TABLET | Freq: Two times a day (BID) | ORAL | Status: DC
  Administered 2018-01-20 – 2018-01-21 (×2): 10 mg via ORAL
  Filled 2018-01-20 (×3): qty 1

## 2018-01-20 MED ORDER — PRAVASTATIN SODIUM 40 MG PO TABS
40.0000 mg | ORAL_TABLET | Freq: Every day | ORAL | Status: DC
  Administered 2018-01-20: 40 mg via ORAL
  Filled 2018-01-20: qty 1

## 2018-01-20 MED ORDER — PHENOL 1.4 % MT LIQD: 1.0000 | OROMUCOSAL | Status: DC | PRN

## 2018-01-20 MED ORDER — LIDOCAINE-EPINEPHRINE 1 %-1:100000 IJ SOLN
INTRAMUSCULAR | Status: DC | PRN
  Administered 2018-01-20: 5 mL

## 2018-01-20 MED ORDER — DOCUSATE SODIUM 100 MG PO CAPS
100.0000 mg | ORAL_CAPSULE | Freq: Two times a day (BID) | ORAL | Status: DC
  Administered 2018-01-20 – 2018-01-21 (×3): 100 mg via ORAL
  Filled 2018-01-20 (×3): qty 1

## 2018-01-20 MED ORDER — SODIUM CHLORIDE 0.9% FLUSH: 3.0000 mL | INTRAVENOUS | Status: DC | PRN

## 2018-01-20 MED ORDER — KETOROLAC TROMETHAMINE 30 MG/ML IJ SOLN: 30.0000 mg | Freq: Once | INTRAMUSCULAR | Status: DC | PRN

## 2018-01-20 MED ORDER — ACETAMINOPHEN 650 MG RE SUPP: 650.0000 mg | RECTAL | Status: DC | PRN

## 2018-01-20 MED ORDER — METHOCARBAMOL 500 MG PO TABS
500.0000 mg | ORAL_TABLET | Freq: Four times a day (QID) | ORAL | Status: DC | PRN
  Administered 2018-01-20 – 2018-01-21 (×3): 500 mg via ORAL
  Filled 2018-01-20 (×3): qty 1

## 2018-01-20 MED ORDER — SUGAMMADEX SODIUM 500 MG/5ML IV SOLN
INTRAVENOUS | Status: AC
  Filled 2018-01-20: qty 5

## 2018-01-20 MED ORDER — CEFAZOLIN SODIUM-DEXTROSE 2-4 GM/100ML-% IV SOLN
2.0000 g | INTRAVENOUS | Status: AC
  Administered 2018-01-20: 2 g via INTRAVENOUS
  Administered 2018-01-20: 1 g via INTRAVENOUS
  Filled 2018-01-20: qty 100

## 2018-01-20 MED ORDER — DILTIAZEM HCL ER COATED BEADS 180 MG PO CP24
180.0000 mg | ORAL_CAPSULE | Freq: Every day | ORAL | Status: DC
  Administered 2018-01-21: 180 mg via ORAL
  Filled 2018-01-20: qty 1

## 2018-01-20 MED ORDER — SODIUM CHLORIDE 0.9% FLUSH: 3.0000 mL | Freq: Two times a day (BID) | INTRAVENOUS | Status: DC

## 2018-01-20 MED ORDER — METHOCARBAMOL 1000 MG/10ML IJ SOLN
500.0000 mg | Freq: Four times a day (QID) | INTRAVENOUS | Status: DC | PRN
  Filled 2018-01-20: qty 5

## 2018-01-20 MED ORDER — LIDOCAINE 2% (20 MG/ML) 5 ML SYRINGE
INTRAMUSCULAR | Status: DC | PRN
  Administered 2018-01-20: 100 mg via INTRAVENOUS

## 2018-01-20 MED ORDER — PROPOFOL 10 MG/ML IV BOLUS
INTRAVENOUS | Status: DC | PRN
  Administered 2018-01-20: 150 mg via INTRAVENOUS

## 2018-01-20 MED ORDER — ALUM & MAG HYDROXIDE-SIMETH 200-200-20 MG/5ML PO SUSP
30.0000 mL | ORAL | Status: DC | PRN
  Administered 2018-01-20: 30 mL via ORAL

## 2018-01-20 MED ORDER — PANTOPRAZOLE SODIUM 20 MG PO TBEC
20.0000 mg | DELAYED_RELEASE_TABLET | Freq: Every day | ORAL | Status: DC
  Administered 2018-01-20 – 2018-01-21 (×2): 20 mg via ORAL
  Filled 2018-01-20 (×2): qty 1

## 2018-01-20 MED ORDER — MENTHOL 3 MG MT LOZG: 1.0000 | LOZENGE | OROMUCOSAL | Status: DC | PRN

## 2018-01-20 MED ORDER — VITAMIN D3 25 MCG (1000 UNIT) PO TABS
2000.0000 [IU] | ORAL_TABLET | Freq: Every day | ORAL | Status: DC
  Administered 2018-01-21: 2000 [IU] via ORAL
  Filled 2018-01-20 (×2): qty 2

## 2018-01-20 MED ORDER — ONDANSETRON HCL 4 MG/2ML IJ SOLN
INTRAMUSCULAR | Status: DC | PRN
  Administered 2018-01-20: 4 mg via INTRAVENOUS

## 2018-01-20 MED ORDER — LIDOCAINE-EPINEPHRINE 1 %-1:100000 IJ SOLN
INTRAMUSCULAR | Status: AC
  Filled 2018-01-20: qty 1

## 2018-01-20 MED ORDER — HYDROMORPHONE HCL 1 MG/ML IJ SOLN
INTRAMUSCULAR | Status: AC
  Filled 2018-01-20: qty 1

## 2018-01-20 MED ORDER — ONDANSETRON HCL 4 MG/2ML IJ SOLN: 4.0000 mg | Freq: Four times a day (QID) | INTRAMUSCULAR | Status: DC | PRN

## 2018-01-20 MED ORDER — ROCURONIUM BROMIDE 10 MG/ML (PF) SYRINGE
PREFILLED_SYRINGE | INTRAVENOUS | Status: DC | PRN
  Administered 2018-01-20 (×2): 10 mg via INTRAVENOUS
  Administered 2018-01-20: 50 mg via INTRAVENOUS
  Administered 2018-01-20: 10 mg via INTRAVENOUS

## 2018-01-20 MED ORDER — LACTATED RINGERS IV SOLN
INTRAVENOUS | Status: DC | PRN
  Administered 2018-01-20: 10:00:00 via INTRAVENOUS

## 2018-01-20 MED ORDER — FENTANYL CITRATE (PF) 250 MCG/5ML IJ SOLN
INTRAMUSCULAR | Status: AC
  Filled 2018-01-20: qty 5

## 2018-01-20 MED ORDER — PROPOFOL 10 MG/ML IV BOLUS
INTRAVENOUS | Status: AC
  Filled 2018-01-20: qty 20

## 2018-01-20 MED ORDER — PHENYLEPHRINE 40 MCG/ML (10ML) SYRINGE FOR IV PUSH (FOR BLOOD PRESSURE SUPPORT)
PREFILLED_SYRINGE | INTRAVENOUS | Status: AC
  Filled 2018-01-20: qty 10

## 2018-01-20 MED ORDER — PROMETHAZINE HCL 25 MG/ML IJ SOLN: 6.2500 mg | INTRAMUSCULAR | Status: DC | PRN

## 2018-01-20 MED ORDER — BUPIVACAINE HCL (PF) 0.5 % IJ SOLN
INTRAMUSCULAR | Status: DC | PRN
  Administered 2018-01-20: 5 mL

## 2018-01-20 MED ORDER — THROMBIN 20000 UNITS EX SOLR
CUTANEOUS | Status: AC
  Filled 2018-01-20: qty 20000

## 2018-01-20 MED ORDER — ONDANSETRON HCL 4 MG/2ML IJ SOLN
INTRAMUSCULAR | Status: AC
  Filled 2018-01-20: qty 2

## 2018-01-20 MED ORDER — HYDROCODONE-ACETAMINOPHEN 5-325 MG PO TABS: 1.0000 | ORAL_TABLET | ORAL | Status: DC | PRN

## 2018-01-20 MED ORDER — SENNA 8.6 MG PO TABS
1.0000 | ORAL_TABLET | Freq: Two times a day (BID) | ORAL | Status: DC
  Administered 2018-01-20 – 2018-01-21 (×3): 8.6 mg via ORAL
  Filled 2018-01-20 (×3): qty 1

## 2018-01-20 MED ORDER — BUPIVACAINE HCL (PF) 0.5 % IJ SOLN
INTRAMUSCULAR | Status: AC
  Filled 2018-01-20: qty 30

## 2018-01-20 MED ORDER — SODIUM CHLORIDE 0.9 % IV SOLN
INTRAVENOUS | Status: DC | PRN
  Administered 2018-01-20: 11:00:00

## 2018-01-20 MED ORDER — OXYCODONE HCL 5 MG PO TABS
10.0000 mg | ORAL_TABLET | ORAL | Status: DC | PRN
  Administered 2018-01-20 – 2018-01-21 (×5): 10 mg via ORAL
  Filled 2018-01-20 (×5): qty 2

## 2018-01-20 MED ORDER — HEMOSTATIC AGENTS (NO CHARGE) OPTIME
TOPICAL | Status: DC | PRN
  Administered 2018-01-20: 1 via TOPICAL

## 2018-01-20 MED ORDER — MORPHINE SULFATE (PF) 2 MG/ML IV SOLN: 2.0000 mg | INTRAVENOUS | Status: DC | PRN

## 2018-01-20 MED ORDER — SUGAMMADEX SODIUM 500 MG/5ML IV SOLN
INTRAVENOUS | Status: DC | PRN
  Administered 2018-01-20: 475.2 mg via INTRAVENOUS

## 2018-01-20 MED ORDER — EPHEDRINE SULFATE-NACL 50-0.9 MG/10ML-% IV SOSY
PREFILLED_SYRINGE | INTRAVENOUS | Status: DC | PRN
  Administered 2018-01-20: 10 mg via INTRAVENOUS
  Administered 2018-01-20: 5 mg via INTRAVENOUS
  Administered 2018-01-20: 10 mg via INTRAVENOUS

## 2018-01-20 SURGICAL SUPPLY — 80 items
ADH SKN CLS APL DERMABOND .7 (GAUZE/BANDAGES/DRESSINGS) ×1
APL SKNCLS STERI-STRIP NONHPOA (GAUZE/BANDAGES/DRESSINGS)
BAG DECANTER FOR FLEXI CONT (MISCELLANEOUS) ×3 IMPLANT
BASKET BONE COLLECTION (BASKET) ×3 IMPLANT
BENZOIN TINCTURE PRP APPL 2/3 (GAUZE/BANDAGES/DRESSINGS) IMPLANT
BIT DRILL 3.5 POWEREASE (BIT) ×1 IMPLANT
BIT DRILL 3.5MM POWEREASE (BIT) ×1
BLADE CLIPPER SURG (BLADE) IMPLANT
BLADE SURG 11 STRL SS (BLADE) ×3 IMPLANT
BUR MATCHSTICK NEURO 3.0 LAGG (BURR) ×3 IMPLANT
BUR PRECISION FLUTE 5.0 (BURR) ×3 IMPLANT
CANISTER SUCT 3000ML PPV (MISCELLANEOUS) ×3 IMPLANT
CARTRIDGE OIL MAESTRO DRILL (MISCELLANEOUS) ×1 IMPLANT
CLOSURE WOUND 1/2 X4 (GAUZE/BANDAGES/DRESSINGS)
CONT SPEC 4OZ CLIKSEAL STRL BL (MISCELLANEOUS) ×3 IMPLANT
COVER BACK TABLE 60X90IN (DRAPES) ×3 IMPLANT
DECANTER SPIKE VIAL GLASS SM (MISCELLANEOUS) ×3 IMPLANT
DERMABOND ADVANCED (GAUZE/BANDAGES/DRESSINGS) ×2
DERMABOND ADVANCED .7 DNX12 (GAUZE/BANDAGES/DRESSINGS) ×1 IMPLANT
DEVICE INTERBODY ELEVATE 23X8 (Cage) ×6 IMPLANT
DIFFUSER DRILL AIR PNEUMATIC (MISCELLANEOUS) ×3 IMPLANT
DRAPE C-ARM 42X72 X-RAY (DRAPES) ×3 IMPLANT
DRAPE C-ARMOR (DRAPES) ×3 IMPLANT
DRAPE LAPAROTOMY 100X72X124 (DRAPES) ×3 IMPLANT
DRAPE SURG 17X23 STRL (DRAPES) ×3 IMPLANT
DRSG OPSITE POSTOP 4X6 (GAUZE/BANDAGES/DRESSINGS) ×2 IMPLANT
DURAPREP 26ML APPLICATOR (WOUND CARE) ×3 IMPLANT
ELECT REM PT RETURN 9FT ADLT (ELECTROSURGICAL) ×3
ELECTRODE REM PT RTRN 9FT ADLT (ELECTROSURGICAL) ×1 IMPLANT
FLOSEAL 5ML (HEMOSTASIS) ×2 IMPLANT
GAUZE SPONGE 4X4 12PLY STRL (GAUZE/BANDAGES/DRESSINGS) IMPLANT
GAUZE SPONGE 4X4 16PLY XRAY LF (GAUZE/BANDAGES/DRESSINGS) IMPLANT
GLOVE BIO SURGEON STRL SZ7.5 (GLOVE) IMPLANT
GLOVE BIOGEL PI IND STRL 7.5 (GLOVE) ×2 IMPLANT
GLOVE BIOGEL PI INDICATOR 7.5 (GLOVE) ×4
GLOVE ECLIPSE 7.0 STRL STRAW (GLOVE) ×6 IMPLANT
GLOVE EXAM NITRILE LRG STRL (GLOVE) IMPLANT
GLOVE EXAM NITRILE XL STR (GLOVE) IMPLANT
GLOVE EXAM NITRILE XS STR PU (GLOVE) IMPLANT
GOWN STRL REUS W/ TWL LRG LVL3 (GOWN DISPOSABLE) ×4 IMPLANT
GOWN STRL REUS W/ TWL XL LVL3 (GOWN DISPOSABLE) IMPLANT
GOWN STRL REUS W/TWL 2XL LVL3 (GOWN DISPOSABLE) IMPLANT
GOWN STRL REUS W/TWL LRG LVL3 (GOWN DISPOSABLE) ×12
GOWN STRL REUS W/TWL XL LVL3 (GOWN DISPOSABLE)
HEMOSTAT POWDER KIT SURGIFOAM (HEMOSTASIS) ×3 IMPLANT
KIT BASIN OR (CUSTOM PROCEDURE TRAY) ×3 IMPLANT
KIT INFUSE XX SMALL 0.7CC (Orthopedic Implant) ×2 IMPLANT
KIT POSITION SURG JACKSON T1 (MISCELLANEOUS) ×3 IMPLANT
KIT TURNOVER KIT B (KITS) ×3 IMPLANT
MILL MEDIUM DISP (BLADE) ×3 IMPLANT
NDL HYPO 18GX1.5 BLUNT FILL (NEEDLE) IMPLANT
NDL SPNL 18GX3.5 QUINCKE PK (NEEDLE) IMPLANT
NEEDLE HYPO 18GX1.5 BLUNT FILL (NEEDLE) IMPLANT
NEEDLE HYPO 22GX1.5 SAFETY (NEEDLE) ×3 IMPLANT
NEEDLE SPNL 18GX3.5 QUINCKE PK (NEEDLE) IMPLANT
NS IRRIG 1000ML POUR BTL (IV SOLUTION) ×3 IMPLANT
OIL CARTRIDGE MAESTRO DRILL (MISCELLANEOUS) ×3
PACK LAMINECTOMY NEURO (CUSTOM PROCEDURE TRAY) ×3 IMPLANT
PAD ARMBOARD 7.5X6 YLW CONV (MISCELLANEOUS) ×9 IMPLANT
PASTE BONE GRAFTON 5CC (Bone Implant) ×2 IMPLANT
ROD 40MM (Rod) ×3 IMPLANT
ROD COBALT 47.5X35 (Rod) ×2 IMPLANT
ROD SPNL CVD 40X4.75X (Rod) IMPLANT
SCREW 5.5X35MM (Screw) ×12 IMPLANT
SCREW BN 35X5.5XMA NS SPNE (Screw) IMPLANT
SCREW SET SOLERA (Screw) ×12 IMPLANT
SCREW SET SOLERA TI (Screw) IMPLANT
SPACER SPNL XLORDOTIC 23X8X (Cage) IMPLANT
SPCR SPNL XLORDOTIC 23X8X (Cage) ×2 IMPLANT
SPONGE LAP 4X18 RFD (DISPOSABLE) IMPLANT
SPONGE SURGIFOAM ABS GEL 100 (HEMOSTASIS) IMPLANT
STRIP CLOSURE SKIN 1/2X4 (GAUZE/BANDAGES/DRESSINGS) IMPLANT
SUT VIC AB 0 CT1 18XCR BRD8 (SUTURE) ×1 IMPLANT
SUT VIC AB 0 CT1 8-18 (SUTURE) ×3
SUT VICRYL 3-0 RB1 18 ABS (SUTURE) ×3 IMPLANT
SYR 3ML LL SCALE MARK (SYRINGE) ×6 IMPLANT
TOWEL GREEN STERILE (TOWEL DISPOSABLE) ×3 IMPLANT
TOWEL GREEN STERILE FF (TOWEL DISPOSABLE) ×3 IMPLANT
TRAY FOLEY MTR SLVR 16FR STAT (SET/KITS/TRAYS/PACK) ×3 IMPLANT
WATER STERILE IRR 1000ML POUR (IV SOLUTION) ×3 IMPLANT

## 2018-01-20 NOTE — Anesthesia Procedure Notes (Signed)
Procedure Name: Intubation Date/Time: 01/20/2018 9:59 AM Performed by: Julieta Bellini, CRNA Pre-anesthesia Checklist: Patient identified, Emergency Drugs available, Suction available and Patient being monitored Patient Re-evaluated:Patient Re-evaluated prior to induction Oxygen Delivery Method: Circle system utilized Preoxygenation: Pre-oxygenation with 100% oxygen Induction Type: IV induction Ventilation: Oral airway inserted - appropriate to patient size Laryngoscope Size: Glidescope and 3 Grade View: Grade I Tube type: Oral Tube size: 7.0 mm Airway Equipment and Method: Rigid stylet and Video-laryngoscopy Placement Confirmation: ETT inserted through vocal cords under direct vision,  positive ETCO2 and breath sounds checked- equal and bilateral Secured at: 20 cm Tube secured with: Tape

## 2018-01-20 NOTE — Transfer of Care (Signed)
Immediate Anesthesia Transfer of Care Note  [name]  Procedure(s) Performed: DECOMPRESSION AND POSTERIOR LUMBAR INTERBODY FUSION LUMBAR 4- LUMBAR 5 (N/A )  Patient Location: PACU  Anesthesia Type:General  Level of Consciousness: awake and drowsy  Airway & Oxygen Therapy: Patient Spontanous Breathing and Patient connected to nasal cannula oxygen  Post-op Assessment: Report given to RN, Post -op Vital signs reviewed and stable, Patient moving all extremities and Patient moving all extremities X 4  Post vital signs: Reviewed and stable  Last Vitals:  Vitals Value Taken Time  BP 125/66 01/20/2018  1:21 PM  Temp    Pulse 70 01/20/2018  1:23 PM  Resp 17 01/20/2018  1:24 PM  SpO2 100 % 01/20/2018  1:23 PM  Vitals shown include unvalidated device data.  Last Pain:  Vitals:   01/20/18 0819  TempSrc: Oral  PainSc:       Patients Stated Pain Goal: 3 (77/41/42 3953)  Complications: No apparent anesthesia complications

## 2018-01-20 NOTE — H&P (Signed)
Chief Complaint   Back and leg pain  History of Present Illness  Joan Torres is a 72 y.o. woman I am seeing for the above.  We have been conservatively managing primarily right-sided back and leg pain.  Briefly, she has been through a course of physical therapy and oral medications.  She has also been referred now for a series of 2 epidural steroid injections.  Unfortunately, she says that the 2nd injection was also minimally effective in reducing her pain. 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  She was initially describing pain in the anterior aspect of her right leg along her shin.  She was initially treated medically although options were limited because of her diabetes and secondary kidney disease.  Unfortunately, she did not have any significant improvement in this shin pain and she was sent to physical therapy.  She says that this did not improve her pain at all either.  Ultimately, MRI of the lumbar spine was ordered after she began to complain of some pain involving her right thigh and the lateral and posterior aspect of her right buttock, as well as pain across her lower back.  She was then referred for neurosurgical evaluation.  Today, she continues to report pain across her lower back with radiation through the right buttock and thigh and again into the right shin area.  She denies any numbness or tingling in the foot.  She does not have any left leg symptoms.  She denies any changes in bowel or bladder function.  Past Medical History   Past Medical History:  Diagnosis Date  . Diabetes mellitus without complication ([place])   . Hypertension   . Hypothyroidism     Past Surgical History   Past Surgical History:  Procedure Laterality Date  . ABDOMINAL HYSTERECTOMY    . APPENDECTOMY    . BREAST SURGERY    . CYST REMOVAL NECK N/A 2018  . HAND SURGERY      Social  History   Social History   Tobacco Use  . Smoking status: Former Research scientist (life sciences)  . Smokeless tobacco: Never Used  Substance Use Topics  . Alcohol use: No  . Drug use: No    Medications   Prior to Admission medications   Medication Sig Start Date End Date Taking? Authorizing Provider  amLODipine (NORVASC) 5 MG tablet Take 5 mg by mouth daily. 09/05/15  Yes [provider]  aspirin EC 81 MG tablet Take 81 mg by mouth daily at 6 PM. 1700   Yes [provider]  Camphor-Eucalyptus-Menthol (VICKS VAPORUB EX) Place 1 application into the nose 3 (three) times daily as needed (for nasal congestion.).   Yes [provider]  CARTIA XT 180 MG 24 hr capsule Take 180 mg by mouth daily. 08/19/15  Yes [provider]  chlorthalidone (HYGROTON) 25 MG tablet Take 25 mg by mouth daily. 09/16/15  Yes [provider]  Cholecalciferol (VITAMIN D3) 2000 units TABS Take 2,000 Units by mouth daily.   Yes [provider]  furosemide (LASIX) 40 MG tablet Take 40 mg by mouth daily as needed (for fluid retention.).   Yes [provider]  glipiZIDE (GLUCOTROL) 10 MG tablet Take 10 mg by mouth 2 (two) times daily. 09/16/15  Yes [provider]  ipratropium (ATROVENT) 0.03 % nasal spray Place 1-2 sprays into both nostrils 2 (two) times daily as  needed (for allergies.).  10/11/15  Yes [provider]  levothyroxine (SYNTHROID, LEVOTHROID) 125 MCG tablet Take 125 mcg by mouth daily before breakfast.  09/16/15  Yes [provider]  metFORMIN (GLUCOPHAGE) 1000 MG tablet Take 1,000 mg by mouth 2 (two) times daily. 08/21/15  Yes [provider]  pravastatin (PRAVACHOL) 40 MG tablet Take 40 mg by mouth at bedtime.  09/16/15  Yes [provider]    Allergies  No Known Allergies  Review of Systems  ROS  Neurologic Exam  Awake, alert, oriented Memory and concentration grossly intact Speech fluent, appropriate CN grossly intact Motor  exam: Upper Extremities Deltoid Bicep Tricep Grip  Right 5/5 5/5 5/5 5/5  Left 5/5 5/5 5/5 5/5   Lower Extremities IP Quad PF DF EHL  Right 5/5 5/5 5/5 5/5 5/5  Left 5/5 5/5 5/5 5/5 5/5   Sensation grossly intact to LT  Imaging  MRI of the lumbar spine dated 08/09/2017 was again reviewed.  This demonstrates primary pathology at L4-5 where there is broad-based disc bulge, bilateral facet arthropathy, and ligamentous hypertrophy which contribute to moderate central lateral recess and foraminal stenosis.  There is also trace grade 1 anterolisthesis of L4 on L5.  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  She has not responded to extensive conservative treatment including medical management, physical therapy, and injection therapy.  Plan  Will proceed with PLIF L4-5  I reviewed in the office with the patient and her husband treatment options at this point.  We discussed extensively continued conservative treatment and surgical decompression/fusion. Risks, benefits, and possible outcomes of surgery were all reviewed. All questions today were answered and consent was obtained.

## 2018-01-20 NOTE — Evaluation (Signed)
Physical Therapy Evaluation Patient Details Name: Joan Torres MRN: [id number] DOB: 1945/09/10 Today's Date: 01/20/2018   History of Present Illness  Pt is 72 yo female with PMH of DM who presents for L4-5 PLIF after back pain and pain in RLE failed conservative mgmt.   Clinical Impression  Pt admitted with above diagnosis. Pt currently with functional limitations due to the deficits listed below (see PT Problem List). Pt needed vc's for keeping back precautions with bed mobility and min A for sit to stand. Ambulated 60' with RW and min-guard A.  Pt reports pain in back but RLE pain relieved at this time. Pt will benefit from skilled PT to increase their independence and safety with mobility to allow discharge to the venue listed below.       Follow Up Recommendations Home health PT    Equipment Recommendations  3in1 (PT)    Recommendations for Other Services       Precautions / Restrictions Precautions Precautions: Back Precaution Booklet Issued: Yes (comment) Precaution Comments: reviewed back precautions Required Braces or Orthoses: Spinal Brace Spinal Brace: Applied in sitting position;Lumbar corset Restrictions Weight Bearing Restrictions: No      Mobility  Bed Mobility Overal bed mobility: Needs Assistance Bed Mobility: Rolling;Sidelying to Sit Rolling: Min assist Sidelying to sit: Min assist       General bed mobility comments: vc's for not twisting and log rolling instead. Min A for LE's off bed, pt able to manage trunk with use of rail  Transfers Overall transfer level: Needs assistance Equipment used: Rolling walker (2 wheeled) Transfers: Sit to/from Stand Sit to Stand: Min assist         General transfer comment: vc's for hand placement, pt needed min A for power up and fwd wt shift  Ambulation/Gait Ambulation/Gait assistance: Min guard Gait Distance (Feet): 75 Feet Assistive device: Rolling walker (2 wheeled) Gait Pattern/deviations:  Step-through pattern;Trunk flexed Gait velocity: decreased Gait velocity interpretation: <1.31 ft/sec, indicative of household ambulator General Gait Details: vc's for erect posture  Stairs            Wheelchair Mobility    Modified Rankin (Stroke Patients Only)       Balance Overall balance assessment: Mild deficits observed, not formally tested                                           Pertinent Vitals/Pain Pain Assessment: Faces Faces Pain Scale: Hurts even more Pain Location: back Pain Descriptors / Indicators: Aching;Operative site guarding Pain Intervention(s): Limited activity within patient's tolerance;Monitored during session;Premedicated before session    Home Living Family/patient expects to be discharged to:: Private residence Living Arrangements: Spouse/significant other Available Help at Discharge: Family;Available 24 hours/day Type of Home: House Home Access: Stairs to enter Entrance Stairs-Rails: Right Entrance Stairs-Number of Steps: 3 Home Layout: One level Home Equipment: Cane - single point;Walker - 2 wheels      Prior Function Level of Independence: Independent with assistive device(s)         Comments: used cane     Hand Dominance        Extremity/Trunk Assessment   Upper Extremity Assessment Upper Extremity Assessment: Overall WFL for tasks assessed    Lower Extremity Assessment Lower Extremity Assessment: Generalized weakness    Cervical / Trunk Assessment Cervical / Trunk Assessment: Kyphotic  Communication   Communication: No difficulties  Cognition  Arousal/Alertness: Awake/alert Behavior During Therapy: WFL for tasks assessed/performed Overall Cognitive Status: Within Functional Limits for tasks assessed                                        General Comments      Exercises     Assessment/Plan    PT Assessment Patient needs continued PT services  PT Problem List Decreased  strength;Decreased activity tolerance;Decreased mobility;Decreased knowledge of use of DME;Decreased knowledge of precautions;Pain;Obesity       PT Treatment Interventions DME instruction;Gait training;Stair training;Functional mobility training;Therapeutic activities;Therapeutic exercise;Patient/family education    PT Goals (Current goals can be found in the Care Plan section)  Acute Rehab PT Goals Patient Stated Goal: return home PT Goal Formulation: With patient Time For Goal Achievement: 01/27/18 Potential to Achieve Goals: Good    Frequency Min 5X/week   Barriers to discharge        Co-evaluation               AM-PAC PT "6 Clicks" Daily Activity  Outcome Measure Difficulty turning over in bed (including adjusting bedclothes, sheets and blankets)?: A Little Difficulty moving from lying on back to sitting on the side of the bed? : A Little Difficulty sitting down on and standing up from a chair with arms (e.g., wheelchair, bedside commode, etc,.)?: A Little Help needed moving to and from a bed to chair (including a wheelchair)?: A Little Help needed walking in hospital room?: A Little Help needed climbing 3-5 steps with a railing? : A Little 6 Click Score: 18    End of Session Equipment Utilized During Treatment: Gait belt;Back brace Activity Tolerance: Patient tolerated treatment well Patient left: in bed;with call bell/phone within reach;with family/visitor present Nurse Communication: Mobility status PT Visit Diagnosis: Pain;Difficulty in walking, not elsewhere classified (R26.2) Pain - part of body: (back)    Time: 4235-3614 PT Time Calculation (min) (ACUTE ONLY): 15 min   Charges:   PT Evaluation $PT Eval Low Complexity: 1 Low     PT G Codes:        Leighton Roach, PT  Acute Rehab Services  [place] 01/20/2018, 4:24 PM

## 2018-01-20 NOTE — Anesthesia Postprocedure Evaluation (Signed)
Anesthesia Post Note  [name]  Procedure(s) Performed: DECOMPRESSION AND POSTERIOR LUMBAR INTERBODY FUSION LUMBAR 4- LUMBAR 5 (N/A )     Patient location during evaluation: PACU Anesthesia Type: General Level of consciousness: sedated and patient cooperative Pain management: pain level controlled Vital Signs Assessment: post-procedure vital signs reviewed and stable Respiratory status: spontaneous breathing Cardiovascular status: stable Anesthetic complications: no    Last Vitals:  Vitals:   01/20/18 1435 01/20/18 1515  BP: (!) 116/58 132/72  Pulse: 66 68  Resp: 10 16  Temp: (!) 36.2 C (!) 36.3 C  SpO2: 98% 100%    Last Pain:  Vitals:   01/20/18 1700  TempSrc:   PainSc: [place]

## 2018-01-20 NOTE — Progress Notes (Signed)
Orthopedic Tech Progress Note Patient Details:  Joan Torres [birth year] [id number]  Patient ID: Joan Torres, female   DOB: April 13, 1946, 72 y.o.   MRN: [id number]   [name] 01/20/2018, 1:49 PMCalled [employer] for Lumbar brace.

## 2018-01-20 NOTE — Anesthesia Preprocedure Evaluation (Signed)
Anesthesia Evaluation  Patient identified by MRN, date of birth, ID band Patient awake    Reviewed: Allergy & Precautions, NPO status , Patient's Chart, lab work & pertinent test results  Airway Mallampati: II       Dental no notable dental hx. (+) Teeth Intact   Pulmonary former smoker,    Pulmonary exam normal breath sounds clear to auscultation       Cardiovascular hypertension, Pt. on medications Normal cardiovascular exam Rhythm:Regular Rate:Normal     Neuro/Psych negative psych ROS   GI/Hepatic   Endo/Other  diabetes, Oral Hypoglycemic AgentsHypothyroidism Morbid obesity  Renal/GU   negative genitourinary   Musculoskeletal  (+) Arthritis , Osteoarthritis,    Abdominal (+) + obese,   Peds  Hematology negative hematology ROS (+)   Anesthesia Other Findings   Reproductive/Obstetrics                             Anesthesia Physical Anesthesia Plan  ASA: III  Anesthesia Plan: General   Post-op Pain Management:    Induction: Intravenous  PONV Risk Score and Plan: 4 or greater and Ondansetron, Dexamethasone and Treatment may vary due to age or medical condition  Airway Management Planned: Oral ETT  Additional Equipment:   Intra-op Plan:   Post-operative Plan: Extubation in OR  Informed Consent: I have reviewed the patients History and Physical, chart, labs and discussed the procedure including the risks, benefits and alternatives for the proposed anesthesia with the patient or authorized representative who has indicated his/her understanding and acceptance.   Dental advisory given  Plan Discussed with: CRNA and Surgeon  Anesthesia Plan Comments:         Anesthesia Quick Evaluation

## 2018-01-20 NOTE — Op Note (Signed)
  NEUROSURGERY OPERATIVE NOTE   PREOP DIAGNOSIS:  1. Lumbar stenosis with radiculopathy 2. Spondylolisthesis, L4-5  POSTOP DIAGNOSIS: Same  PROCEDURE: 1. L4 laminectomy with facetectomy for decompression of exiting nerve roots, more than would be required for placement of interbody graft 2. Placement of anterior interbody device - Medtronic Rise expandable cage 4mm 12deg lordotic x2 3. Posterior non-segmental instrumentation using cortical pedicle screws at L4 - L5 - Medtronic Solera 5.5x78mm x4 4. Interbody arthrodesis, L4-5 5. Use of locally harvested bone autograft 6. Use of non-structural bone allograft - BMP  SURGEON: Dr. Consuella Lose, MD  ASSISTANT: None  ANESTHESIA: General Endotracheal  EBL: 150cc  SPECIMENS: None  DRAINS: None  COMPLICATIONS: None immediate  CONDITION: Hemodynamically stable to PACU  HISTORY: Joan Torres is a 72 y.o. female who has been followed in the outpatient clinic with back and leg pain related to spondylosis with spondylolisthesis at L4-5. She attempted multiple conservative treatments and ultimately elected to proceed with surgical decompression and fusion. Risks and benefits were reviewed and consent was obtained.  PROCEDURE IN DETAIL: After informed consent was obtained and witnessed, the patient was brought to the operating room. After induction of general anesthesia, the patient was positioned on the operative table in the prone position. All pressure points were meticulously padded. Incision was then marked out and prepped and draped in the usual sterile fashion.  After timeout was conducted, skin was infiltrated with local anesthetic. Skin incision was then made sharply and Bovie electrocautery was used to dissect the subcutaneous tissue until the lumbodorsal fascia was identified and incised. The muscle was then elevated in the subperiosteal plane and the L4 lamina and L4-5 facet complexes were identified. Self-retaining  retractors were then placed  At this point attention was turned to decompression. Complete L4 laminectomy with facetectomy was completed using a combination of Kerrison rongeurs and a high-speed drill. The L4 and L5 nerve roots were identified and decompressed completely by removal of the superior articulating process of L5.  Good decompression of the exiting and traversing roots was confirmed by easy passage of a ball-tipped dissector out the L4-5 foramen, as well as at the L5-S1 foramen.  Disc space was then identified, incised, and using a combination of shavers, curettes and rongeurs, complete discectomy was completed. Endplates were prepared, and bone harvested during decompression was mixed with BMP and packed into the interspace. A 8 mm expandable 12 degree lordotic cage was tapped into place bilaterally.  Good position was confirmed with fluoroscopy.  At this point, the L4 and L5 cortical pedicle screws were drilled and tapped to 5.5 mm. Screws were then placed in L4 and L5. Rod was then placed, set screws placed and final tightened. Final AP and lateral fluoroscopic images confirmed good position.  The wound was then irrigated with copious amounts of antibiotic saline, then closed in standard fashion using a combination of interrupted 0 and 3-0 Vicryl stitches in the muscular, fascial, and subcutaneous layers. Skin was then closed using standard Dermabond. Sterile dressing was then applied. The patient was then transferred to the stretcher, extubated, and taken to the postanesthesia care unit in stable hemodynamic condition.  At the end of the case all sponge, needle, cottonoid, and instrument counts were correct.

## 2018-01-21 LAB — GLUCOSE, CAPILLARY
Glucose-Capillary: 159 mg/dL — ABNORMAL HIGH (ref 70–99)
Glucose-Capillary: 235 mg/dL — ABNORMAL HIGH (ref 70–99)

## 2018-01-21 MED ORDER — ASPIRIN EC 81 MG PO TBEC: 81.0000 mg | DELAYED_RELEASE_TABLET | Freq: Every day | ORAL | 0 refills | Status: DC

## 2018-01-21 MED ORDER — HYDROXYZINE HCL 25 MG PO TABS
25.0000 mg | ORAL_TABLET | ORAL | Status: DC | PRN
  Administered 2018-01-21: 50 mg via ORAL
  Filled 2018-01-21: qty 2

## 2018-01-21 MED ORDER — METHOCARBAMOL 500 MG PO TABS: 500.0000 mg | ORAL_TABLET | Freq: Four times a day (QID) | ORAL | 2 refills | Status: DC | PRN

## 2018-01-21 MED ORDER — OXYCODONE-ACETAMINOPHEN 7.5-325 MG PO TABS: 1.0000 | ORAL_TABLET | ORAL | 0 refills | Status: DC | PRN

## 2018-01-21 MED ORDER — HYDROXYZINE HCL 25 MG PO TABS: 25.0000 mg | ORAL_TABLET | ORAL | 0 refills | Status: DC | PRN

## 2018-01-21 NOTE — Discharge Summary (Signed)
Physician Discharge Summary  Patient ID: Joan Torres MRN: [id number] DOB/AGE: 1945/08/03 72 y.o.  Admit date: 01/20/2018 Discharge date: 01/21/2018  Admission Diagnoses:  L4-5 Spondylolisthesis  Discharge Diagnoses:  Same Active Problems:   Spondylolisthesis at L4-L5 level   Discharged Condition: Stable  Hospital Course:  Joan Torres is a 72 y.o. female who was admitted for the below procedure. There were no post operative complications. At time of discharge, pain was well controlled, ambulating with Pt/OT, tolerating po, voiding normal. Ready for discharge.  Treatments: Surgery 1. L4 laminectomy with facetectomy for decompression of exiting nerve roots, more than would be required for placement of interbody graft 2. Placement of anterior interbody device - Medtronic Rise expandable cage 23mm 12deg lordotic x2 3. Posterior non-segmental instrumentation using cortical pedicle screws at L4 - L5 - Medtronic Solera 5.5x2mm x4 4. Interbody arthrodesis, L4-5 5. Use of locally harvested bone autograft 6. Use of non-structural bone allograft - BMP  Discharge Exam: Blood pressure 109/60, pulse 77, temperature 97.7 F (36.5 C), temperature source Oral, resp. rate 16, height 5\' 5"  (1.651 m), weight 118.8 kg (261 lb 12.8 oz), SpO2 96 %. Awake, alert, oriented Speech fluent, appropriate CN grossly intact 5/5 BUE/BLE Wound c/d/i  Disposition: Discharge disposition: 01-Home or Self Care       Discharge Instructions    Call MD for:  difficulty breathing, headache or visual disturbances   Complete by:  As directed    Call MD for:  persistant dizziness or light-headedness   Complete by:  As directed    Call MD for:  redness, tenderness, or signs of infection (pain, swelling, redness, odor or green/yellow discharge around incision site)   Complete by:  As directed    Call MD for:  severe uncontrolled pain   Complete by:  As directed    Call MD for:  temperature >100.4    Complete by:  As directed    Diet general   Complete by:  As directed    Driving Restrictions   Complete by:  As directed    Do not drive until given clearance.   Increase activity slowly   Complete by:  As directed    Lifting restrictions   Complete by:  As directed    Do not lift anything >10lbs. Avoid bending and twisting in awkward positions. Avoid bending at the back.   May shower / Bathe   Complete by:  As directed    In 24 hours. Okay to wash wound with warm soapy water. Avoid scrubbing the wound. Pat dry.   Remove dressing in 24 hours   Complete by:  As directed      Allergies as of 01/21/2018   No Known Allergies     Medication List    TAKE these medications   amLODipine 5 MG tablet Commonly known as:  NORVASC Take 5 mg by mouth daily.   aspirin EC 81 MG tablet Take 1 tablet (81 mg total) by mouth daily at 6 PM. 1700 Start taking on:  01/27/2018 What changed:  These instructions start on 01/27/2018. If you are unsure what to do until then, ask your doctor or other care provider.   CARTIA XT 180 MG 24 hr capsule Generic drug:  diltiazem Take 180 mg by mouth daily.   chlorthalidone 25 MG tablet Commonly known as:  HYGROTON Take 25 mg by mouth daily.   furosemide 40 MG tablet Commonly known as:  LASIX Take 40 mg by mouth daily as needed (  for fluid retention.).   glipiZIDE 10 MG tablet Commonly known as:  GLUCOTROL Take 10 mg by mouth 2 (two) times daily.   hydrOXYzine 25 MG tablet Commonly known as:  ATARAX/VISTARIL Take 1-2 tablets (25-50 mg total) by mouth every 4 (four) hours as needed for itching.   ipratropium 0.03 % nasal spray Commonly known as:  ATROVENT Place 1-2 sprays into both nostrils 2 (two) times daily as needed (for allergies.).   levothyroxine 125 MCG tablet Commonly known as:  SYNTHROID, LEVOTHROID Take 125 mcg by mouth daily before breakfast.   metFORMIN 1000 MG tablet Commonly known as:  GLUCOPHAGE Take 1,000 mg by mouth 2 (two)  times daily.   methocarbamol 500 MG tablet Commonly known as:  ROBAXIN Take 1 tablet (500 mg total) by mouth every 6 (six) hours as needed for muscle spasms.   oxyCODONE-acetaminophen 7.5-325 MG tablet Commonly known as:  PERCOCET Take 1 tablet by mouth every 4 (four) hours as needed for severe pain.   pravastatin 40 MG tablet Commonly known as:  PRAVACHOL Take 40 mg by mouth at bedtime.   VICKS VAPORUB EX Place 1 application into the nose 3 (three) times daily as needed (for nasal congestion.).   Vitamin D3 2000 units Tabs Take 2,000 Units by mouth daily.            Durable Medical Equipment  (From admission, onward)        Start     Ordered   01/21/18 0816  DME 3-in-1  Once     01/21/18 0815     Follow-up Information    Consuella Lose, MD. Schedule an appointment as soon as possible for a visit in 3 week(s).   Specialty:  Neurosurgery Contact information: [address] [place] [address] [place] [address] [phone]           Signed: Traci Sermon 01/21/2018, 8:15 AM

## 2018-01-21 NOTE — Progress Notes (Signed)
Occupational Therapy Evaluation Patient Details Name: Joan Torres MRN: [id number] DOB: 1946/01/03 Today's Date: 01/21/2018    History of Present Illness Pt is 72 yo female with PMH of DM who presents for L4-5 PLIF after back pain and pain in RLE failed conservative mgmt. PMH significant for but not limited to: DM, HTN, breast sx.    Clinical Impression   PTA patient reports independent with ADL, IADl and mobility using cane (commuinty mobility only).  Patient currently requires setup assistance for UB ADL, modA for LB ADL, min guard for transfers and bed mobility.  Educated on precautions, ADL participation with compensatory techniques/AE, safety and energy conservation.  Recommend HHOT and 3:1. Patient agreeable to complete basin baths until [facility] assesses shower transfer.  She is limited by deficits listed below (see problem list) and would benefit from continued OT services acutely and post dc in order to maximize independence with ADL, IADLs, safety and mobility. Will continue to follow while admitted.     Follow Up Recommendations  Home health OT;Supervision/Assistance - 24 hour    Equipment Recommendations  3 in 1 bedside commode    Recommendations for Other Services       Precautions / Restrictions Precautions Precautions: Back Precaution Booklet Issued: Yes (comment)(issued in prior PT session) Precaution Comments: reviewed back precautions and handout, required cueing to recall twisting precaution initally; able to recall 3/3 at completion of session Required Braces or Orthoses: Spinal Brace Spinal Brace: Applied in sitting position;Lumbar corset Restrictions Weight Bearing Restrictions: No      Mobility Bed Mobility Overal bed mobility: Needs Assistance Bed Mobility: Sit to Sidelying         Sit to sidelying: Min guard General bed mobility comments: requires cueing for log rolling technique   Transfers Overall transfer level: Needs assistance Equipment used:  Rolling walker (2 wheeled) Transfers: Sit to/from Stand Sit to Stand: Min guard         General transfer comment: VCs for hand placement and safety, elevated surface with minguard for safety     Balance Overall balance assessment: Mild deficits observed, not formally tested                                         ADL either performed or assessed with clinical judgement   ADL Overall ADL's : Needs assistance/impaired Eating/Feeding: Modified independent;Sitting   Grooming: Set up;Sitting;Cueing for compensatory techniques   Upper Body Bathing: Set up;Sitting   Lower Body Bathing: Moderate assistance;Sit to/from stand Lower Body Bathing Details (indicate cue type and reason): seated EOB, requires assist to reach B feet and buttocks; educated on AE and compensatory techniques to adhere to back precautions  Upper Body Dressing : Set up;Sitting   Lower Body Dressing: Min guard;Sit to/from stand Lower Body Dressing Details (indicate cue type and reason): educated on compensatory techniques and AE for LB dressing, unable to reach feet at this time  Toilet Transfer: Min guard;Ambulation;BSC;RW(simulated at [facility]) Toilet Transfer Details (indicate cue type and reason): min guard for safety, cueing for adherance to precautions and wearing brace Toileting- Clothing Manipulation and Hygiene: Minimal assistance;Sit to/from Nurse, children's Details (indicate cue type and reason): educated patinet on waiting until [facility] addresses due to decreased activity tolerance  Functional mobility during ADLs: Min guard;Rolling walker;Cueing for safety General ADL Comments: patient requires cueing to adhere to spinal precautions (twisting)  Vision   Vision Assessment?: No apparent visual deficits     Perception     Praxis      Pertinent Vitals/Pain Pain Assessment: Faces Faces Pain Scale: Hurts even more Pain Location: back Pain Descriptors / Indicators:  Aching;Operative site guarding Pain Intervention(s): Monitored during session;Repositioned     Hand Dominance     Extremity/Trunk Assessment Upper Extremity Assessment Upper Extremity Assessment: Overall WFL for tasks assessed   Lower Extremity Assessment Lower Extremity Assessment: Defer to PT evaluation       Communication Communication Communication: No difficulties   Cognition Arousal/Alertness: Awake/alert Behavior During Therapy: WFL for tasks assessed/performed Overall Cognitive Status: Within Functional Limits for tasks assessed                                     General Comments       Exercises     Shoulder Instructions      Home Living Family/patient expects to be discharged to:: Private residence Living Arrangements: Spouse/significant other Available Help at Discharge: Family;Available 24 hours/day Type of Home: House Home Access: Stairs to enter [employer] of Steps: 3 Entrance Stairs-Rails: Right Home Layout: One level     Bathroom Shower/Tub: Teacher, early years/pre: Standard     Home Equipment: Cane - single point;Walker - 2 wheels          Prior Functioning/Environment Level of Independence: Independent with assistive device(s)        Comments: reports independent with ADL and IADL, used cane for outdoor mobility but no DME indoors        OT Problem List: Decreased strength;Decreased activity tolerance;Decreased safety awareness;Decreased knowledge of use of DME or AE;Decreased knowledge of precautions;Obesity;Pain      OT Treatment/Interventions: Self-care/ADL training;Energy conservation;DME and/or AE instruction;Therapeutic activities;Patient/family education    OT Goals(Current goals can be found in the care plan section) Acute Rehab OT Goals Patient Stated Goal: return home OT Goal Formulation: With patient Time For Goal Achievement: 02/04/18 Potential to Achieve Goals: Good  OT  Frequency: Min 2X/week   Barriers to D/C:            Co-evaluation              AM-PAC PT "6 Clicks" Daily Activity     Outcome Measure Help from another person eating meals?: None Help from another person taking care of personal grooming?: A Little Help from another person toileting, which includes using toliet, bedpan, or urinal?: A Little Help from another person bathing (including washing, rinsing, drying)?: A Lot Help from another person to put on and taking off regular upper body clothing?: None Help from another person to put on and taking off regular lower body clothing?: A Lot 6 Click Score: 18   End of Session Equipment Utilized During Treatment: Gait belt;Rolling walker;Back brace Nurse Communication: Mobility status(needs post dc)  Activity Tolerance: Patient tolerated treatment well;Patient limited by fatigue Patient left: in bed;with call bell/phone within reach  OT Visit Diagnosis: Other abnormalities of gait and mobility (R26.89);Muscle weakness (generalized) (M62.81);Pain Pain - part of body: (back)                Time: 9476-5465 OT Time Calculation (min): 30 min Charges:  OT General Charges $OT Visit: 1 Visit OT Evaluation $OT Eval Moderate Complexity: 1 Mod OT Treatments $Self Care/Home Management : 8-22 mins G-Codes:     Anner Crete  [place] Gre[place], OTR/L  Pager [place] 01/21/2018, 11:04 AM

## 2018-01-21 NOTE — Progress Notes (Signed)
Physical Therapy Treatment Patient Details Name: Joan Torres MRN: [id number] DOB: 10/06/1945 Today's Date: 01/21/2018    History of Present Illness Pt is 72 yo female with PMH of DM who presents for L4-5 PLIF after back pain and pain in RLE failed conservative mgmt. PMH significant for but not limited to: DM, HTN, breast sx.     PT Comments    Pt progressing towards physical therapy goals. Was able to perform transfers and ambulation with gross supervision for safety, and min guard assist for stair negotiation. Pt was educated on brace application/wearing schedule, car transfer, precautions, and general safety with activity progression at home. Continue to feel that HHPT would be beneficial to progress mobility as tolerance for functional activity is decreased at this time.    Follow Up Recommendations  Home health PT     Equipment Recommendations  3in1 (PT)    Recommendations for Other Services       Precautions / Restrictions Precautions Precautions: Back Precaution Booklet Issued: Yes (comment)(issued in prior PT session) Precaution Comments: reviewed back precautions and handout, required cueing to recall twisting precaution initally; able to recall 3/3 at completion of session Required Braces or Orthoses: Spinal Brace Spinal Brace: Applied in sitting position;Lumbar corset Restrictions Weight Bearing Restrictions: No    Mobility  Bed Mobility               General bed mobility comments: Pt sitting up on EOB eating breakfast when PT arrived.  Transfers Overall transfer level: Needs assistance Equipment used: Rolling walker (2 wheeled) Transfers: Sit to/from Stand Sit to Stand: Supervision         General transfer comment: VC's for hand placement on seated surface for safety. Pt required increased time to power-up to full stand.  Ambulation/Gait Ambulation/Gait assistance: Supervision Gait Distance (Feet): 250 Feet Assistive device: Rolling walker (2  wheeled) Gait Pattern/deviations: Step-through pattern;Trunk flexed Gait velocity: decreased Gait velocity interpretation: 1.31 - 2.62 ft/sec, indicative of limited community ambulator General Gait Details: VC's for improved posture. Slow but generally steady.    Stairs Stairs: Yes Stairs assistance: Min guard Stair Management: One rail Right;Step to pattern;Forwards Number of Stairs: 3 General stair comments: VC's for sequencing and general safety with stair negotiation.    Wheelchair Mobility    Modified Rankin (Stroke Patients Only)       Balance Overall balance assessment: Mild deficits observed, not formally tested                                          Cognition Arousal/Alertness: Awake/alert Behavior During Therapy: WFL for tasks assessed/performed Overall Cognitive Status: Within Functional Limits for tasks assessed                                        Exercises      General Comments        Pertinent Vitals/Pain Pain Assessment: Faces Faces Pain Scale: Hurts even more Pain Location: back Pain Descriptors / Indicators: Aching;Operative site guarding Pain Intervention(s): Monitored during session;Repositioned    Home Living Family/patient expects to be discharged to:: Private residence Living Arrangements: Spouse/significant other Available Help at Discharge: Family;Available 24 hours/day Type of Home: House Home Access: Stairs to enter Entrance Stairs-Rails: Right Home Layout: One level Home Equipment: Cane - single point;Walker -  2 wheels      Prior Function Level of Independence: Independent with assistive device(s)      Comments: reports independent with ADL and IADL, used cane for outdoor mobility but no DME indoors   PT Goals (current goals can now be found in the care plan section) Acute Rehab PT Goals Patient Stated Goal: return home PT Goal Formulation: With patient Time For Goal Achievement:  01/27/18 Potential to Achieve Goals: Good Progress towards PT goals: Progressing toward goals    Frequency    Min 5X/week      PT Plan Current plan remains appropriate    Co-evaluation              AM-PAC PT "6 Clicks" Daily Activity  Outcome Measure  Difficulty turning over in bed (including adjusting bedclothes, sheets and blankets)?: A Little Difficulty moving from lying on back to sitting on the side of the bed? : A Little Difficulty sitting down on and standing up from a chair with arms (e.g., wheelchair, bedside commode, etc,.)?: A Little Help needed moving to and from a bed to chair (including a wheelchair)?: A Little Help needed walking in hospital room?: A Little Help needed climbing 3-5 steps with a railing? : A Little 6 Click Score: 18    End of Session Equipment Utilized During Treatment: Gait belt;Back brace Activity Tolerance: Patient tolerated treatment well Patient left: with call bell/phone within reach(Sitting EOB) Nurse Communication: Mobility status PT Visit Diagnosis: Pain;Difficulty in walking, not elsewhere classified (R26.2) Pain - part of body: (back)     Time: 6720-9470 PT Time Calculation (min) (ACUTE ONLY): 24 min  Charges:  $Gait Training: 23-37 mins                    G Codes:       Rolinda Roan, PT, DPT Acute Rehabilitation Services Pager: [place] 01/21/2018, 9:37 AM

## 2018-01-21 NOTE — Progress Notes (Signed)
Patient is discharged from room 3C09 at this time. Alert and in stable condition. IV site d/c'd and instructions read to patient and spouse with understanding verbalized. Left unit via wheelchair with all belongings at side. 

## 2018-01-21 NOTE — Care Management Note (Signed)
Case Management Note  Patient Details  Name: Joan Torres MRN: [id number] Date of Birth: 1946/04/13  Subjective/Objective:   72 yr old female s/p L4-L5 decompression and posterior lumbar interbody fusion .                Action/Plan: Case manager spoke with patient concerning discharge plan. Choice for Home Health Agency was offered. Patient will not be going to her home for recovery, will be going there her fiance's home- [address], [place], [place]. Referral was called to [facility] with [facility] in [place]. CM faxed orders, F2F, OP note, H&P, demographics to her at [phone].    Expected Discharge Date:  01/21/18               Expected Discharge Plan:  [place]  In-House Referral:  NA  Discharge planning Services  CM Consult  Post Acute Care Choice:  Durable Medical Equipment, Home Health Choice offered to:  Patient  DME Arranged:  3-N-1(will have to pick up wide 3in1 at [employer] [facility]) DME Agency:  [place]:  PT, OT [facility] Agency:  Other - See comment([facility])  Status of Service:  Completed, signed off  If discussed at [place] of Stay Meetings, dates discussed:    Additional Comments:  Ninfa Meeker, RN 01/21/2018, 11:58 AM

## 2018-01-23 DIAGNOSIS — K5900 Constipation, unspecified: Secondary | ICD-10-CM | POA: Diagnosis not present

## 2018-01-23 DIAGNOSIS — Z794 Long term (current) use of insulin: Secondary | ICD-10-CM | POA: Diagnosis not present

## 2018-01-23 DIAGNOSIS — E119 Type 2 diabetes mellitus without complications: Secondary | ICD-10-CM | POA: Diagnosis not present

## 2018-01-23 DIAGNOSIS — R1084 Generalized abdominal pain: Secondary | ICD-10-CM | POA: Diagnosis not present

## 2018-01-23 DIAGNOSIS — Z7982 Long term (current) use of aspirin: Secondary | ICD-10-CM | POA: Diagnosis not present

## 2018-01-23 DIAGNOSIS — Z981 Arthrodesis status: Secondary | ICD-10-CM | POA: Diagnosis not present

## 2018-01-23 DIAGNOSIS — E7800 Pure hypercholesterolemia, unspecified: Secondary | ICD-10-CM | POA: Diagnosis not present

## 2018-01-23 DIAGNOSIS — Z4789 Encounter for other orthopedic aftercare: Secondary | ICD-10-CM | POA: Diagnosis not present

## 2018-01-23 DIAGNOSIS — Z7984 Long term (current) use of oral hypoglycemic drugs: Secondary | ICD-10-CM | POA: Diagnosis not present

## 2018-01-23 DIAGNOSIS — M4316 Spondylolisthesis, lumbar region: Secondary | ICD-10-CM | POA: Diagnosis not present

## 2018-01-23 DIAGNOSIS — I10 Essential (primary) hypertension: Secondary | ICD-10-CM | POA: Diagnosis not present

## 2018-01-27 DIAGNOSIS — Z7982 Long term (current) use of aspirin: Secondary | ICD-10-CM | POA: Diagnosis not present

## 2018-01-27 DIAGNOSIS — E119 Type 2 diabetes mellitus without complications: Secondary | ICD-10-CM | POA: Diagnosis not present

## 2018-01-27 DIAGNOSIS — Z4789 Encounter for other orthopedic aftercare: Secondary | ICD-10-CM | POA: Diagnosis not present

## 2018-01-27 DIAGNOSIS — Z981 Arthrodesis status: Secondary | ICD-10-CM | POA: Diagnosis not present

## 2018-01-27 DIAGNOSIS — M4316 Spondylolisthesis, lumbar region: Secondary | ICD-10-CM | POA: Diagnosis not present

## 2018-01-27 DIAGNOSIS — Z7984 Long term (current) use of oral hypoglycemic drugs: Secondary | ICD-10-CM | POA: Diagnosis not present

## 2018-01-29 DIAGNOSIS — Z7982 Long term (current) use of aspirin: Secondary | ICD-10-CM | POA: Diagnosis not present

## 2018-01-29 DIAGNOSIS — M4316 Spondylolisthesis, lumbar region: Secondary | ICD-10-CM | POA: Diagnosis not present

## 2018-01-29 DIAGNOSIS — Z981 Arthrodesis status: Secondary | ICD-10-CM | POA: Diagnosis not present

## 2018-01-29 DIAGNOSIS — M4726 Other spondylosis with radiculopathy, lumbar region: Secondary | ICD-10-CM | POA: Diagnosis not present

## 2018-01-29 DIAGNOSIS — Z4789 Encounter for other orthopedic aftercare: Secondary | ICD-10-CM | POA: Diagnosis not present

## 2018-01-29 DIAGNOSIS — E119 Type 2 diabetes mellitus without complications: Secondary | ICD-10-CM | POA: Diagnosis not present

## 2018-01-29 DIAGNOSIS — Z7984 Long term (current) use of oral hypoglycemic drugs: Secondary | ICD-10-CM | POA: Diagnosis not present

## 2018-02-02 DIAGNOSIS — Z7984 Long term (current) use of oral hypoglycemic drugs: Secondary | ICD-10-CM | POA: Diagnosis not present

## 2018-02-02 DIAGNOSIS — Z7982 Long term (current) use of aspirin: Secondary | ICD-10-CM | POA: Diagnosis not present

## 2018-02-02 DIAGNOSIS — Z981 Arthrodesis status: Secondary | ICD-10-CM | POA: Diagnosis not present

## 2018-02-02 DIAGNOSIS — M4316 Spondylolisthesis, lumbar region: Secondary | ICD-10-CM | POA: Diagnosis not present

## 2018-02-02 DIAGNOSIS — E119 Type 2 diabetes mellitus without complications: Secondary | ICD-10-CM | POA: Diagnosis not present

## 2018-02-02 DIAGNOSIS — Z4789 Encounter for other orthopedic aftercare: Secondary | ICD-10-CM | POA: Diagnosis not present

## 2018-02-03 DIAGNOSIS — Z981 Arthrodesis status: Secondary | ICD-10-CM | POA: Diagnosis not present

## 2018-02-03 DIAGNOSIS — E119 Type 2 diabetes mellitus without complications: Secondary | ICD-10-CM | POA: Diagnosis not present

## 2018-02-03 DIAGNOSIS — Z7982 Long term (current) use of aspirin: Secondary | ICD-10-CM | POA: Diagnosis not present

## 2018-02-03 DIAGNOSIS — M4316 Spondylolisthesis, lumbar region: Secondary | ICD-10-CM | POA: Diagnosis not present

## 2018-02-03 DIAGNOSIS — Z4789 Encounter for other orthopedic aftercare: Secondary | ICD-10-CM | POA: Diagnosis not present

## 2018-02-03 DIAGNOSIS — Z7984 Long term (current) use of oral hypoglycemic drugs: Secondary | ICD-10-CM | POA: Diagnosis not present

## 2018-02-04 DIAGNOSIS — Z7984 Long term (current) use of oral hypoglycemic drugs: Secondary | ICD-10-CM | POA: Diagnosis not present

## 2018-02-04 DIAGNOSIS — E119 Type 2 diabetes mellitus without complications: Secondary | ICD-10-CM | POA: Diagnosis not present

## 2018-02-04 DIAGNOSIS — M4316 Spondylolisthesis, lumbar region: Secondary | ICD-10-CM | POA: Diagnosis not present

## 2018-02-04 DIAGNOSIS — Z981 Arthrodesis status: Secondary | ICD-10-CM | POA: Diagnosis not present

## 2018-02-04 DIAGNOSIS — Z4789 Encounter for other orthopedic aftercare: Secondary | ICD-10-CM | POA: Diagnosis not present

## 2018-02-04 DIAGNOSIS — Z7982 Long term (current) use of aspirin: Secondary | ICD-10-CM | POA: Diagnosis not present

## 2018-02-07 DIAGNOSIS — Z7984 Long term (current) use of oral hypoglycemic drugs: Secondary | ICD-10-CM | POA: Diagnosis not present

## 2018-02-07 DIAGNOSIS — Z79899 Other long term (current) drug therapy: Secondary | ICD-10-CM | POA: Diagnosis not present

## 2018-02-07 DIAGNOSIS — E1122 Type 2 diabetes mellitus with diabetic chronic kidney disease: Secondary | ICD-10-CM | POA: Diagnosis not present

## 2018-02-07 DIAGNOSIS — I129 Hypertensive chronic kidney disease with stage 1 through stage 4 chronic kidney disease, or unspecified chronic kidney disease: Secondary | ICD-10-CM | POA: Diagnosis not present

## 2018-02-07 DIAGNOSIS — E785 Hyperlipidemia, unspecified: Secondary | ICD-10-CM | POA: Diagnosis not present

## 2018-02-07 DIAGNOSIS — Z7982 Long term (current) use of aspirin: Secondary | ICD-10-CM | POA: Diagnosis not present

## 2018-02-07 DIAGNOSIS — N189 Chronic kidney disease, unspecified: Secondary | ICD-10-CM | POA: Diagnosis not present

## 2018-02-07 DIAGNOSIS — E039 Hypothyroidism, unspecified: Secondary | ICD-10-CM | POA: Diagnosis not present

## 2018-02-07 DIAGNOSIS — B029 Zoster without complications: Secondary | ICD-10-CM | POA: Diagnosis not present

## 2018-02-07 DIAGNOSIS — R21 Rash and other nonspecific skin eruption: Secondary | ICD-10-CM | POA: Diagnosis not present

## 2018-02-10 DIAGNOSIS — Z7982 Long term (current) use of aspirin: Secondary | ICD-10-CM | POA: Diagnosis not present

## 2018-02-10 DIAGNOSIS — M4316 Spondylolisthesis, lumbar region: Secondary | ICD-10-CM | POA: Diagnosis not present

## 2018-02-10 DIAGNOSIS — Z4789 Encounter for other orthopedic aftercare: Secondary | ICD-10-CM | POA: Diagnosis not present

## 2018-02-10 DIAGNOSIS — E119 Type 2 diabetes mellitus without complications: Secondary | ICD-10-CM | POA: Diagnosis not present

## 2018-02-10 DIAGNOSIS — Z981 Arthrodesis status: Secondary | ICD-10-CM | POA: Diagnosis not present

## 2018-02-10 DIAGNOSIS — Z7984 Long term (current) use of oral hypoglycemic drugs: Secondary | ICD-10-CM | POA: Diagnosis not present

## 2018-02-11 DIAGNOSIS — M4316 Spondylolisthesis, lumbar region: Secondary | ICD-10-CM | POA: Diagnosis not present

## 2018-02-11 DIAGNOSIS — Z4789 Encounter for other orthopedic aftercare: Secondary | ICD-10-CM | POA: Diagnosis not present

## 2018-02-11 DIAGNOSIS — Z7984 Long term (current) use of oral hypoglycemic drugs: Secondary | ICD-10-CM | POA: Diagnosis not present

## 2018-02-11 DIAGNOSIS — Z981 Arthrodesis status: Secondary | ICD-10-CM | POA: Diagnosis not present

## 2018-02-11 DIAGNOSIS — E119 Type 2 diabetes mellitus without complications: Secondary | ICD-10-CM | POA: Diagnosis not present

## 2018-02-11 DIAGNOSIS — Z7982 Long term (current) use of aspirin: Secondary | ICD-10-CM | POA: Diagnosis not present

## 2018-02-13 DIAGNOSIS — M4316 Spondylolisthesis, lumbar region: Secondary | ICD-10-CM | POA: Diagnosis not present

## 2018-02-13 DIAGNOSIS — Z4789 Encounter for other orthopedic aftercare: Secondary | ICD-10-CM | POA: Diagnosis not present

## 2018-02-13 DIAGNOSIS — Z7982 Long term (current) use of aspirin: Secondary | ICD-10-CM | POA: Diagnosis not present

## 2018-02-13 DIAGNOSIS — Z981 Arthrodesis status: Secondary | ICD-10-CM | POA: Diagnosis not present

## 2018-02-13 DIAGNOSIS — Z7984 Long term (current) use of oral hypoglycemic drugs: Secondary | ICD-10-CM | POA: Diagnosis not present

## 2018-02-13 DIAGNOSIS — E119 Type 2 diabetes mellitus without complications: Secondary | ICD-10-CM | POA: Diagnosis not present

## 2018-02-16 DIAGNOSIS — Z7984 Long term (current) use of oral hypoglycemic drugs: Secondary | ICD-10-CM | POA: Diagnosis not present

## 2018-02-16 DIAGNOSIS — Z4789 Encounter for other orthopedic aftercare: Secondary | ICD-10-CM | POA: Diagnosis not present

## 2018-02-16 DIAGNOSIS — M4316 Spondylolisthesis, lumbar region: Secondary | ICD-10-CM | POA: Diagnosis not present

## 2018-02-16 DIAGNOSIS — E119 Type 2 diabetes mellitus without complications: Secondary | ICD-10-CM | POA: Diagnosis not present

## 2018-02-16 DIAGNOSIS — Z981 Arthrodesis status: Secondary | ICD-10-CM | POA: Diagnosis not present

## 2018-02-16 DIAGNOSIS — Z7982 Long term (current) use of aspirin: Secondary | ICD-10-CM | POA: Diagnosis not present

## 2018-02-17 DIAGNOSIS — E119 Type 2 diabetes mellitus without complications: Secondary | ICD-10-CM | POA: Diagnosis not present

## 2018-02-17 DIAGNOSIS — Z7984 Long term (current) use of oral hypoglycemic drugs: Secondary | ICD-10-CM | POA: Diagnosis not present

## 2018-02-17 DIAGNOSIS — M4316 Spondylolisthesis, lumbar region: Secondary | ICD-10-CM | POA: Diagnosis not present

## 2018-02-17 DIAGNOSIS — Z981 Arthrodesis status: Secondary | ICD-10-CM | POA: Diagnosis not present

## 2018-02-17 DIAGNOSIS — Z4789 Encounter for other orthopedic aftercare: Secondary | ICD-10-CM | POA: Diagnosis not present

## 2018-02-17 DIAGNOSIS — Z7982 Long term (current) use of aspirin: Secondary | ICD-10-CM | POA: Diagnosis not present

## 2018-02-18 DIAGNOSIS — M4726 Other spondylosis with radiculopathy, lumbar region: Secondary | ICD-10-CM | POA: Diagnosis not present

## 2018-02-19 DIAGNOSIS — E119 Type 2 diabetes mellitus without complications: Secondary | ICD-10-CM | POA: Diagnosis not present

## 2018-02-19 DIAGNOSIS — Z4789 Encounter for other orthopedic aftercare: Secondary | ICD-10-CM | POA: Diagnosis not present

## 2018-02-19 DIAGNOSIS — Z7982 Long term (current) use of aspirin: Secondary | ICD-10-CM | POA: Diagnosis not present

## 2018-02-19 DIAGNOSIS — Z7984 Long term (current) use of oral hypoglycemic drugs: Secondary | ICD-10-CM | POA: Diagnosis not present

## 2018-02-19 DIAGNOSIS — Z981 Arthrodesis status: Secondary | ICD-10-CM | POA: Diagnosis not present

## 2018-02-19 DIAGNOSIS — M4316 Spondylolisthesis, lumbar region: Secondary | ICD-10-CM | POA: Diagnosis not present

## 2018-02-23 DIAGNOSIS — Z7984 Long term (current) use of oral hypoglycemic drugs: Secondary | ICD-10-CM | POA: Diagnosis not present

## 2018-02-23 DIAGNOSIS — Z7982 Long term (current) use of aspirin: Secondary | ICD-10-CM | POA: Diagnosis not present

## 2018-02-23 DIAGNOSIS — Z4789 Encounter for other orthopedic aftercare: Secondary | ICD-10-CM | POA: Diagnosis not present

## 2018-02-23 DIAGNOSIS — M4316 Spondylolisthesis, lumbar region: Secondary | ICD-10-CM | POA: Diagnosis not present

## 2018-02-23 DIAGNOSIS — Z981 Arthrodesis status: Secondary | ICD-10-CM | POA: Diagnosis not present

## 2018-02-23 DIAGNOSIS — E119 Type 2 diabetes mellitus without complications: Secondary | ICD-10-CM | POA: Diagnosis not present

## 2018-02-25 DIAGNOSIS — Z981 Arthrodesis status: Secondary | ICD-10-CM | POA: Diagnosis not present

## 2018-02-25 DIAGNOSIS — Z7984 Long term (current) use of oral hypoglycemic drugs: Secondary | ICD-10-CM | POA: Diagnosis not present

## 2018-02-25 DIAGNOSIS — Z4789 Encounter for other orthopedic aftercare: Secondary | ICD-10-CM | POA: Diagnosis not present

## 2018-02-25 DIAGNOSIS — M4316 Spondylolisthesis, lumbar region: Secondary | ICD-10-CM | POA: Diagnosis not present

## 2018-02-25 DIAGNOSIS — Z7982 Long term (current) use of aspirin: Secondary | ICD-10-CM | POA: Diagnosis not present

## 2018-02-25 DIAGNOSIS — E119 Type 2 diabetes mellitus without complications: Secondary | ICD-10-CM | POA: Diagnosis not present

## 2018-03-02 DIAGNOSIS — Z981 Arthrodesis status: Secondary | ICD-10-CM | POA: Diagnosis not present

## 2018-03-02 DIAGNOSIS — Z7982 Long term (current) use of aspirin: Secondary | ICD-10-CM | POA: Diagnosis not present

## 2018-03-02 DIAGNOSIS — Z4789 Encounter for other orthopedic aftercare: Secondary | ICD-10-CM | POA: Diagnosis not present

## 2018-03-02 DIAGNOSIS — E119 Type 2 diabetes mellitus without complications: Secondary | ICD-10-CM | POA: Diagnosis not present

## 2018-03-02 DIAGNOSIS — Z7984 Long term (current) use of oral hypoglycemic drugs: Secondary | ICD-10-CM | POA: Diagnosis not present

## 2018-03-02 DIAGNOSIS — M4316 Spondylolisthesis, lumbar region: Secondary | ICD-10-CM | POA: Diagnosis not present

## 2018-03-04 DIAGNOSIS — E119 Type 2 diabetes mellitus without complications: Secondary | ICD-10-CM | POA: Diagnosis not present

## 2018-03-04 DIAGNOSIS — Z4789 Encounter for other orthopedic aftercare: Secondary | ICD-10-CM | POA: Diagnosis not present

## 2018-03-04 DIAGNOSIS — Z7984 Long term (current) use of oral hypoglycemic drugs: Secondary | ICD-10-CM | POA: Diagnosis not present

## 2018-03-04 DIAGNOSIS — Z981 Arthrodesis status: Secondary | ICD-10-CM | POA: Diagnosis not present

## 2018-03-04 DIAGNOSIS — Z7982 Long term (current) use of aspirin: Secondary | ICD-10-CM | POA: Diagnosis not present

## 2018-03-04 DIAGNOSIS — M4316 Spondylolisthesis, lumbar region: Secondary | ICD-10-CM | POA: Diagnosis not present

## 2018-03-09 DIAGNOSIS — Z981 Arthrodesis status: Secondary | ICD-10-CM | POA: Diagnosis not present

## 2018-03-09 DIAGNOSIS — Z7982 Long term (current) use of aspirin: Secondary | ICD-10-CM | POA: Diagnosis not present

## 2018-03-09 DIAGNOSIS — Z7984 Long term (current) use of oral hypoglycemic drugs: Secondary | ICD-10-CM | POA: Diagnosis not present

## 2018-03-09 DIAGNOSIS — Z4789 Encounter for other orthopedic aftercare: Secondary | ICD-10-CM | POA: Diagnosis not present

## 2018-03-09 DIAGNOSIS — E119 Type 2 diabetes mellitus without complications: Secondary | ICD-10-CM | POA: Diagnosis not present

## 2018-03-09 DIAGNOSIS — M4316 Spondylolisthesis, lumbar region: Secondary | ICD-10-CM | POA: Diagnosis not present

## 2018-03-10 DIAGNOSIS — I10 Essential (primary) hypertension: Secondary | ICD-10-CM | POA: Diagnosis not present

## 2018-03-10 DIAGNOSIS — N184 Chronic kidney disease, stage 4 (severe): Secondary | ICD-10-CM | POA: Diagnosis not present

## 2018-03-10 DIAGNOSIS — L723 Sebaceous cyst: Secondary | ICD-10-CM | POA: Diagnosis not present

## 2018-03-10 DIAGNOSIS — E039 Hypothyroidism, unspecified: Secondary | ICD-10-CM | POA: Diagnosis not present

## 2018-03-10 DIAGNOSIS — E782 Mixed hyperlipidemia: Secondary | ICD-10-CM | POA: Diagnosis not present

## 2018-03-10 DIAGNOSIS — E1122 Type 2 diabetes mellitus with diabetic chronic kidney disease: Secondary | ICD-10-CM | POA: Diagnosis not present

## 2018-03-10 DIAGNOSIS — E876 Hypokalemia: Secondary | ICD-10-CM | POA: Diagnosis not present

## 2018-03-11 DIAGNOSIS — Z981 Arthrodesis status: Secondary | ICD-10-CM | POA: Diagnosis not present

## 2018-03-11 DIAGNOSIS — M4316 Spondylolisthesis, lumbar region: Secondary | ICD-10-CM | POA: Diagnosis not present

## 2018-03-11 DIAGNOSIS — Z7982 Long term (current) use of aspirin: Secondary | ICD-10-CM | POA: Diagnosis not present

## 2018-03-11 DIAGNOSIS — Z7984 Long term (current) use of oral hypoglycemic drugs: Secondary | ICD-10-CM | POA: Diagnosis not present

## 2018-03-11 DIAGNOSIS — Z4789 Encounter for other orthopedic aftercare: Secondary | ICD-10-CM | POA: Diagnosis not present

## 2018-03-11 DIAGNOSIS — E119 Type 2 diabetes mellitus without complications: Secondary | ICD-10-CM | POA: Diagnosis not present

## 2018-03-12 DIAGNOSIS — Z4789 Encounter for other orthopedic aftercare: Secondary | ICD-10-CM | POA: Diagnosis not present

## 2018-03-12 DIAGNOSIS — Z7984 Long term (current) use of oral hypoglycemic drugs: Secondary | ICD-10-CM | POA: Diagnosis not present

## 2018-03-12 DIAGNOSIS — Z981 Arthrodesis status: Secondary | ICD-10-CM | POA: Diagnosis not present

## 2018-03-12 DIAGNOSIS — Z7982 Long term (current) use of aspirin: Secondary | ICD-10-CM | POA: Diagnosis not present

## 2018-03-12 DIAGNOSIS — E119 Type 2 diabetes mellitus without complications: Secondary | ICD-10-CM | POA: Diagnosis not present

## 2018-03-12 DIAGNOSIS — M4316 Spondylolisthesis, lumbar region: Secondary | ICD-10-CM | POA: Diagnosis not present

## 2018-03-13 DIAGNOSIS — E782 Mixed hyperlipidemia: Secondary | ICD-10-CM | POA: Diagnosis not present

## 2018-03-13 DIAGNOSIS — M4807 Spinal stenosis, lumbosacral region: Secondary | ICD-10-CM | POA: Diagnosis not present

## 2018-03-13 DIAGNOSIS — Z6841 Body Mass Index (BMI) 40.0 and over, adult: Secondary | ICD-10-CM | POA: Diagnosis not present

## 2018-03-13 DIAGNOSIS — I10 Essential (primary) hypertension: Secondary | ICD-10-CM | POA: Diagnosis not present

## 2018-03-13 DIAGNOSIS — N184 Chronic kidney disease, stage 4 (severe): Secondary | ICD-10-CM | POA: Diagnosis not present

## 2018-03-13 DIAGNOSIS — E1122 Type 2 diabetes mellitus with diabetic chronic kidney disease: Secondary | ICD-10-CM | POA: Diagnosis not present

## 2018-03-13 DIAGNOSIS — R809 Proteinuria, unspecified: Secondary | ICD-10-CM | POA: Diagnosis not present

## 2018-03-18 DIAGNOSIS — M4316 Spondylolisthesis, lumbar region: Secondary | ICD-10-CM | POA: Diagnosis not present

## 2018-03-18 DIAGNOSIS — Z4789 Encounter for other orthopedic aftercare: Secondary | ICD-10-CM | POA: Diagnosis not present

## 2018-03-18 DIAGNOSIS — Z981 Arthrodesis status: Secondary | ICD-10-CM | POA: Diagnosis not present

## 2018-03-18 DIAGNOSIS — Z7984 Long term (current) use of oral hypoglycemic drugs: Secondary | ICD-10-CM | POA: Diagnosis not present

## 2018-03-18 DIAGNOSIS — Z7982 Long term (current) use of aspirin: Secondary | ICD-10-CM | POA: Diagnosis not present

## 2018-03-18 DIAGNOSIS — E119 Type 2 diabetes mellitus without complications: Secondary | ICD-10-CM | POA: Diagnosis not present

## 2018-03-20 DIAGNOSIS — Z4789 Encounter for other orthopedic aftercare: Secondary | ICD-10-CM | POA: Diagnosis not present

## 2018-03-20 DIAGNOSIS — M4316 Spondylolisthesis, lumbar region: Secondary | ICD-10-CM | POA: Diagnosis not present

## 2018-03-20 DIAGNOSIS — Z981 Arthrodesis status: Secondary | ICD-10-CM | POA: Diagnosis not present

## 2018-03-20 DIAGNOSIS — Z7984 Long term (current) use of oral hypoglycemic drugs: Secondary | ICD-10-CM | POA: Diagnosis not present

## 2018-03-20 DIAGNOSIS — E119 Type 2 diabetes mellitus without complications: Secondary | ICD-10-CM | POA: Diagnosis not present

## 2018-03-20 DIAGNOSIS — Z7982 Long term (current) use of aspirin: Secondary | ICD-10-CM | POA: Diagnosis not present

## 2018-04-09 DIAGNOSIS — I10 Essential (primary) hypertension: Secondary | ICD-10-CM | POA: Diagnosis not present

## 2018-04-09 DIAGNOSIS — Z0001 Encounter for general adult medical examination with abnormal findings: Secondary | ICD-10-CM | POA: Diagnosis not present

## 2018-04-09 DIAGNOSIS — Z23 Encounter for immunization: Secondary | ICD-10-CM | POA: Diagnosis not present

## 2018-05-21 DIAGNOSIS — Z6841 Body Mass Index (BMI) 40.0 and over, adult: Secondary | ICD-10-CM | POA: Diagnosis not present

## 2018-05-21 DIAGNOSIS — I10 Essential (primary) hypertension: Secondary | ICD-10-CM | POA: Diagnosis not present

## 2018-05-21 DIAGNOSIS — M5416 Radiculopathy, lumbar region: Secondary | ICD-10-CM | POA: Diagnosis not present

## 2018-06-23 DIAGNOSIS — M10372 Gout due to renal impairment, left ankle and foot: Secondary | ICD-10-CM | POA: Diagnosis not present

## 2018-06-23 DIAGNOSIS — N184 Chronic kidney disease, stage 4 (severe): Secondary | ICD-10-CM | POA: Diagnosis not present

## 2018-06-23 DIAGNOSIS — E1122 Type 2 diabetes mellitus with diabetic chronic kidney disease: Secondary | ICD-10-CM | POA: Diagnosis not present

## 2018-06-23 DIAGNOSIS — I10 Essential (primary) hypertension: Secondary | ICD-10-CM | POA: Diagnosis not present

## 2018-06-23 DIAGNOSIS — E1165 Type 2 diabetes mellitus with hyperglycemia: Secondary | ICD-10-CM | POA: Diagnosis not present

## 2018-06-23 DIAGNOSIS — E876 Hypokalemia: Secondary | ICD-10-CM | POA: Diagnosis not present

## 2018-06-23 DIAGNOSIS — M10371 Gout due to renal impairment, right ankle and foot: Secondary | ICD-10-CM | POA: Diagnosis not present

## 2018-06-23 DIAGNOSIS — E039 Hypothyroidism, unspecified: Secondary | ICD-10-CM | POA: Diagnosis not present

## 2018-06-30 DIAGNOSIS — E1122 Type 2 diabetes mellitus with diabetic chronic kidney disease: Secondary | ICD-10-CM | POA: Diagnosis not present

## 2018-06-30 DIAGNOSIS — M1711 Unilateral primary osteoarthritis, right knee: Secondary | ICD-10-CM | POA: Diagnosis not present

## 2018-06-30 DIAGNOSIS — M5416 Radiculopathy, lumbar region: Secondary | ICD-10-CM | POA: Diagnosis not present

## 2018-06-30 DIAGNOSIS — E782 Mixed hyperlipidemia: Secondary | ICD-10-CM | POA: Diagnosis not present

## 2018-06-30 DIAGNOSIS — I10 Essential (primary) hypertension: Secondary | ICD-10-CM | POA: Diagnosis not present

## 2018-06-30 DIAGNOSIS — N184 Chronic kidney disease, stage 4 (severe): Secondary | ICD-10-CM | POA: Diagnosis not present

## 2018-06-30 DIAGNOSIS — Z6841 Body Mass Index (BMI) 40.0 and over, adult: Secondary | ICD-10-CM | POA: Diagnosis not present

## 2018-09-25 DIAGNOSIS — E876 Hypokalemia: Secondary | ICD-10-CM | POA: Diagnosis not present

## 2018-09-25 DIAGNOSIS — E1165 Type 2 diabetes mellitus with hyperglycemia: Secondary | ICD-10-CM | POA: Diagnosis not present

## 2018-09-25 DIAGNOSIS — E782 Mixed hyperlipidemia: Secondary | ICD-10-CM | POA: Diagnosis not present

## 2018-09-25 DIAGNOSIS — R011 Cardiac murmur, unspecified: Secondary | ICD-10-CM | POA: Diagnosis not present

## 2018-09-25 DIAGNOSIS — E1122 Type 2 diabetes mellitus with diabetic chronic kidney disease: Secondary | ICD-10-CM | POA: Diagnosis not present

## 2018-09-25 DIAGNOSIS — E039 Hypothyroidism, unspecified: Secondary | ICD-10-CM | POA: Diagnosis not present

## 2018-09-25 DIAGNOSIS — I10 Essential (primary) hypertension: Secondary | ICD-10-CM | POA: Diagnosis not present

## 2018-10-01 DIAGNOSIS — Z6841 Body Mass Index (BMI) 40.0 and over, adult: Secondary | ICD-10-CM | POA: Diagnosis not present

## 2018-10-01 DIAGNOSIS — I10 Essential (primary) hypertension: Secondary | ICD-10-CM | POA: Diagnosis not present

## 2018-10-01 DIAGNOSIS — E039 Hypothyroidism, unspecified: Secondary | ICD-10-CM | POA: Diagnosis not present

## 2018-10-01 DIAGNOSIS — M5416 Radiculopathy, lumbar region: Secondary | ICD-10-CM | POA: Diagnosis not present

## 2018-10-01 DIAGNOSIS — B0223 Postherpetic polyneuropathy: Secondary | ICD-10-CM | POA: Diagnosis not present

## 2018-10-01 DIAGNOSIS — N184 Chronic kidney disease, stage 4 (severe): Secondary | ICD-10-CM | POA: Diagnosis not present

## 2018-10-01 DIAGNOSIS — E1122 Type 2 diabetes mellitus with diabetic chronic kidney disease: Secondary | ICD-10-CM | POA: Diagnosis not present

## 2018-12-29 DIAGNOSIS — I10 Essential (primary) hypertension: Secondary | ICD-10-CM | POA: Diagnosis not present

## 2018-12-29 DIAGNOSIS — E1122 Type 2 diabetes mellitus with diabetic chronic kidney disease: Secondary | ICD-10-CM | POA: Diagnosis not present

## 2018-12-29 DIAGNOSIS — E876 Hypokalemia: Secondary | ICD-10-CM | POA: Diagnosis not present

## 2018-12-29 DIAGNOSIS — E1165 Type 2 diabetes mellitus with hyperglycemia: Secondary | ICD-10-CM | POA: Diagnosis not present

## 2018-12-29 DIAGNOSIS — E039 Hypothyroidism, unspecified: Secondary | ICD-10-CM | POA: Diagnosis not present

## 2018-12-29 DIAGNOSIS — E782 Mixed hyperlipidemia: Secondary | ICD-10-CM | POA: Diagnosis not present

## 2019-01-01 DIAGNOSIS — E1122 Type 2 diabetes mellitus with diabetic chronic kidney disease: Secondary | ICD-10-CM | POA: Diagnosis not present

## 2019-01-01 DIAGNOSIS — I10 Essential (primary) hypertension: Secondary | ICD-10-CM | POA: Diagnosis not present

## 2019-01-01 DIAGNOSIS — Z1389 Encounter for screening for other disorder: Secondary | ICD-10-CM | POA: Diagnosis not present

## 2019-01-01 DIAGNOSIS — E039 Hypothyroidism, unspecified: Secondary | ICD-10-CM | POA: Diagnosis not present

## 2019-01-01 DIAGNOSIS — B0223 Postherpetic polyneuropathy: Secondary | ICD-10-CM | POA: Diagnosis not present

## 2019-01-01 DIAGNOSIS — M4807 Spinal stenosis, lumbosacral region: Secondary | ICD-10-CM | POA: Diagnosis not present

## 2019-01-01 DIAGNOSIS — Z6841 Body Mass Index (BMI) 40.0 and over, adult: Secondary | ICD-10-CM | POA: Diagnosis not present

## 2019-03-31 DIAGNOSIS — E039 Hypothyroidism, unspecified: Secondary | ICD-10-CM | POA: Diagnosis not present

## 2019-03-31 DIAGNOSIS — E1122 Type 2 diabetes mellitus with diabetic chronic kidney disease: Secondary | ICD-10-CM | POA: Diagnosis not present

## 2019-03-31 DIAGNOSIS — E1165 Type 2 diabetes mellitus with hyperglycemia: Secondary | ICD-10-CM | POA: Diagnosis not present

## 2019-03-31 DIAGNOSIS — I10 Essential (primary) hypertension: Secondary | ICD-10-CM | POA: Diagnosis not present

## 2019-03-31 DIAGNOSIS — E559 Vitamin D deficiency, unspecified: Secondary | ICD-10-CM | POA: Diagnosis not present

## 2019-03-31 DIAGNOSIS — N184 Chronic kidney disease, stage 4 (severe): Secondary | ICD-10-CM | POA: Diagnosis not present

## 2019-03-31 DIAGNOSIS — E782 Mixed hyperlipidemia: Secondary | ICD-10-CM | POA: Diagnosis not present

## 2019-04-05 DIAGNOSIS — I10 Essential (primary) hypertension: Secondary | ICD-10-CM | POA: Diagnosis not present

## 2019-04-05 DIAGNOSIS — I517 Cardiomegaly: Secondary | ICD-10-CM | POA: Diagnosis not present

## 2019-04-05 DIAGNOSIS — I358 Other nonrheumatic aortic valve disorders: Secondary | ICD-10-CM | POA: Diagnosis not present

## 2019-04-05 DIAGNOSIS — B0223 Postherpetic polyneuropathy: Secondary | ICD-10-CM | POA: Diagnosis not present

## 2019-04-05 DIAGNOSIS — E039 Hypothyroidism, unspecified: Secondary | ICD-10-CM | POA: Diagnosis not present

## 2019-04-05 DIAGNOSIS — Z23 Encounter for immunization: Secondary | ICD-10-CM | POA: Diagnosis not present

## 2019-04-05 DIAGNOSIS — E1122 Type 2 diabetes mellitus with diabetic chronic kidney disease: Secondary | ICD-10-CM | POA: Diagnosis not present

## 2019-04-05 DIAGNOSIS — E782 Mixed hyperlipidemia: Secondary | ICD-10-CM | POA: Diagnosis not present

## 2019-04-14 DIAGNOSIS — I10 Essential (primary) hypertension: Secondary | ICD-10-CM | POA: Diagnosis not present

## 2019-04-14 DIAGNOSIS — E782 Mixed hyperlipidemia: Secondary | ICD-10-CM | POA: Diagnosis not present

## 2019-05-01 IMAGING — RF DG C-ARM 61-120 MIN
1 series · 2 of 2 positions shown · non-contrast
Comparison: Lumbar spine MRI 08/09/2017.

CLINICAL DATA: Lumbar fusion

EXAM:
LUMBAR SPINE - 2-3 VIEW; DG C-ARM 61-120 MIN

[Series 1: run · 2 of 2 slices shown]
[im 1/2]
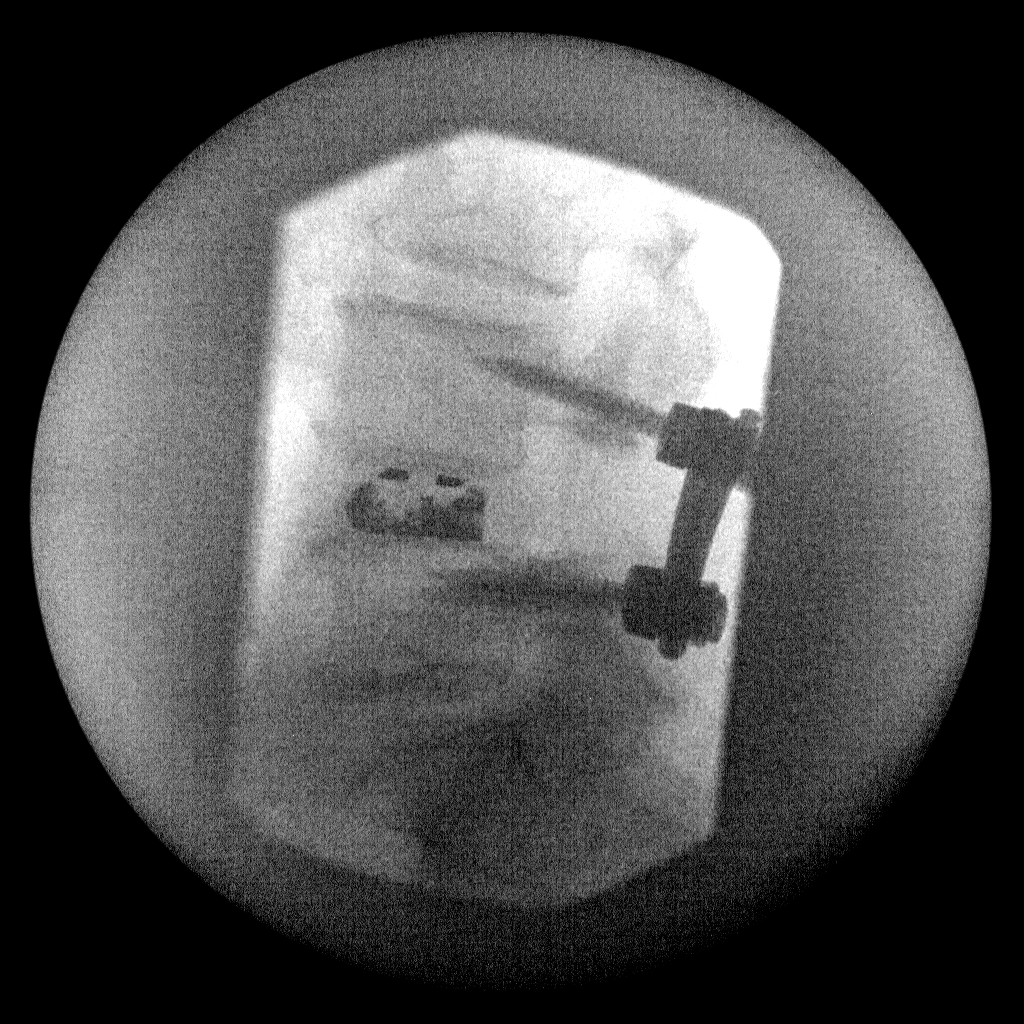
[im 2/2]
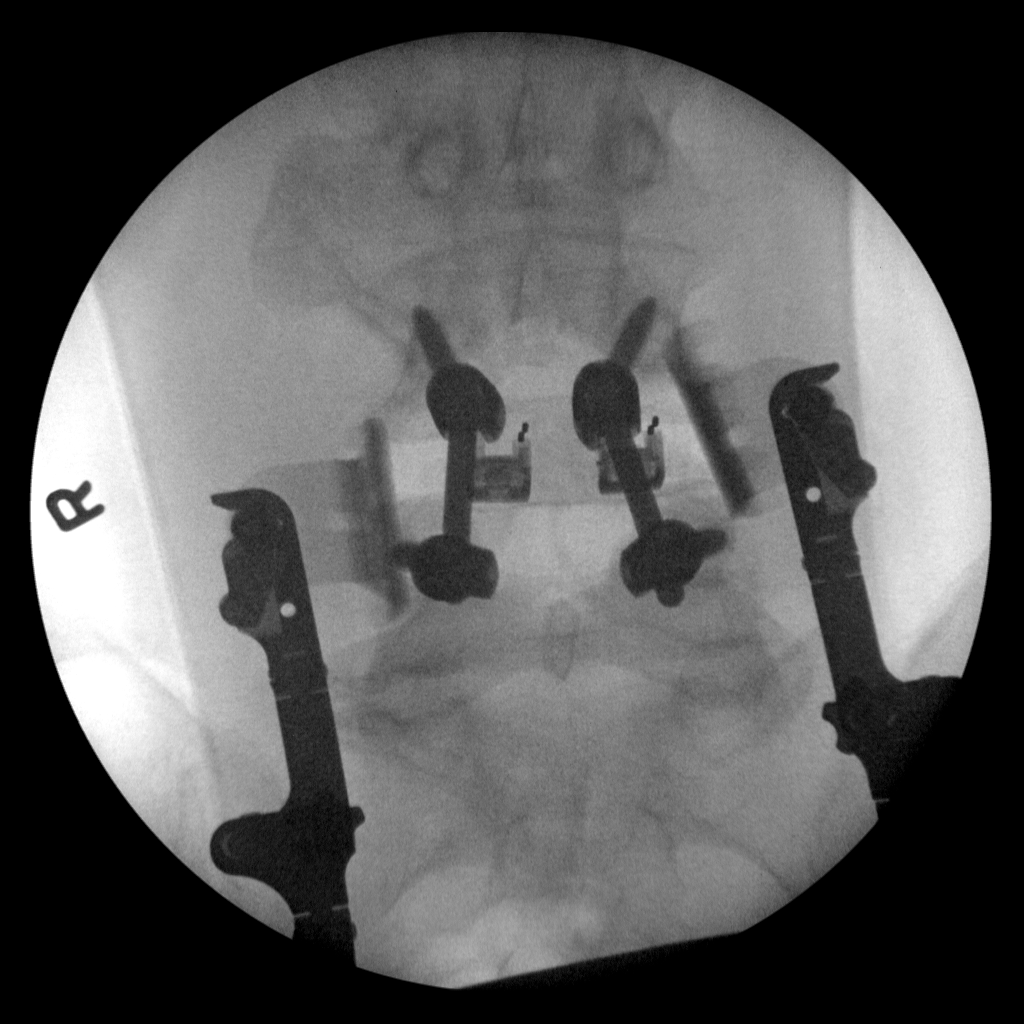

[2 of 2 positions shown; findings below may reference images not displayed]

FINDINGS: 2 intraoperative spot fluoro films obtained during discectomy and
posterior fusion. Soft tissue retractors evident. Bilateral pedicle
screws are noted at L4 and L5 with interbody graft material evident.
IMPRESSION: Intraoperative assessment during L4-5 fusion.

## 2019-05-14 DIAGNOSIS — E1122 Type 2 diabetes mellitus with diabetic chronic kidney disease: Secondary | ICD-10-CM | POA: Diagnosis not present

## 2019-05-14 DIAGNOSIS — I10 Essential (primary) hypertension: Secondary | ICD-10-CM | POA: Diagnosis not present

## 2019-05-20 DIAGNOSIS — Z1231 Encounter for screening mammogram for malignant neoplasm of breast: Secondary | ICD-10-CM | POA: Diagnosis not present

## 2019-06-24 DIAGNOSIS — I10 Essential (primary) hypertension: Secondary | ICD-10-CM | POA: Diagnosis not present

## 2019-06-24 DIAGNOSIS — N184 Chronic kidney disease, stage 4 (severe): Secondary | ICD-10-CM | POA: Diagnosis not present

## 2019-06-24 DIAGNOSIS — E039 Hypothyroidism, unspecified: Secondary | ICD-10-CM | POA: Diagnosis not present

## 2019-06-24 DIAGNOSIS — E876 Hypokalemia: Secondary | ICD-10-CM | POA: Diagnosis not present

## 2019-06-29 DIAGNOSIS — Z6841 Body Mass Index (BMI) 40.0 and over, adult: Secondary | ICD-10-CM | POA: Diagnosis not present

## 2019-06-29 DIAGNOSIS — I872 Venous insufficiency (chronic) (peripheral): Secondary | ICD-10-CM | POA: Diagnosis not present

## 2019-06-29 DIAGNOSIS — E039 Hypothyroidism, unspecified: Secondary | ICD-10-CM | POA: Diagnosis not present

## 2019-06-29 DIAGNOSIS — Z23 Encounter for immunization: Secondary | ICD-10-CM | POA: Diagnosis not present

## 2019-06-29 DIAGNOSIS — E1122 Type 2 diabetes mellitus with diabetic chronic kidney disease: Secondary | ICD-10-CM | POA: Diagnosis not present

## 2019-06-29 DIAGNOSIS — Z0001 Encounter for general adult medical examination with abnormal findings: Secondary | ICD-10-CM | POA: Diagnosis not present

## 2019-06-29 DIAGNOSIS — I10 Essential (primary) hypertension: Secondary | ICD-10-CM | POA: Diagnosis not present

## 2019-06-29 DIAGNOSIS — B0223 Postherpetic polyneuropathy: Secondary | ICD-10-CM | POA: Diagnosis not present

## 2019-07-14 DIAGNOSIS — Z6841 Body Mass Index (BMI) 40.0 and over, adult: Secondary | ICD-10-CM | POA: Diagnosis not present

## 2019-07-14 DIAGNOSIS — E1122 Type 2 diabetes mellitus with diabetic chronic kidney disease: Secondary | ICD-10-CM | POA: Diagnosis not present

## 2019-07-14 DIAGNOSIS — N184 Chronic kidney disease, stage 4 (severe): Secondary | ICD-10-CM | POA: Diagnosis not present

## 2019-08-13 DIAGNOSIS — E7849 Other hyperlipidemia: Secondary | ICD-10-CM | POA: Diagnosis not present

## 2019-08-13 DIAGNOSIS — I10 Essential (primary) hypertension: Secondary | ICD-10-CM | POA: Diagnosis not present

## 2019-08-18 DIAGNOSIS — E1165 Type 2 diabetes mellitus with hyperglycemia: Secondary | ICD-10-CM | POA: Diagnosis not present

## 2019-08-18 DIAGNOSIS — I10 Essential (primary) hypertension: Secondary | ICD-10-CM | POA: Diagnosis not present

## 2019-08-18 DIAGNOSIS — E1122 Type 2 diabetes mellitus with diabetic chronic kidney disease: Secondary | ICD-10-CM | POA: Diagnosis not present

## 2019-08-18 DIAGNOSIS — N184 Chronic kidney disease, stage 4 (severe): Secondary | ICD-10-CM | POA: Diagnosis not present

## 2019-08-23 DIAGNOSIS — E039 Hypothyroidism, unspecified: Secondary | ICD-10-CM | POA: Diagnosis not present

## 2019-08-23 DIAGNOSIS — E1122 Type 2 diabetes mellitus with diabetic chronic kidney disease: Secondary | ICD-10-CM | POA: Diagnosis not present

## 2019-08-23 DIAGNOSIS — Z6841 Body Mass Index (BMI) 40.0 and over, adult: Secondary | ICD-10-CM | POA: Diagnosis not present

## 2019-08-23 DIAGNOSIS — B0223 Postherpetic polyneuropathy: Secondary | ICD-10-CM | POA: Diagnosis not present

## 2019-08-23 DIAGNOSIS — I10 Essential (primary) hypertension: Secondary | ICD-10-CM | POA: Diagnosis not present

## 2019-08-23 DIAGNOSIS — E876 Hypokalemia: Secondary | ICD-10-CM | POA: Diagnosis not present

## 2019-10-13 DIAGNOSIS — E7849 Other hyperlipidemia: Secondary | ICD-10-CM | POA: Diagnosis not present

## 2019-10-13 DIAGNOSIS — I10 Essential (primary) hypertension: Secondary | ICD-10-CM | POA: Diagnosis not present

## 2019-11-12 DIAGNOSIS — N184 Chronic kidney disease, stage 4 (severe): Secondary | ICD-10-CM | POA: Diagnosis not present

## 2019-11-12 DIAGNOSIS — E1122 Type 2 diabetes mellitus with diabetic chronic kidney disease: Secondary | ICD-10-CM | POA: Diagnosis not present

## 2019-11-17 DIAGNOSIS — M109 Gout, unspecified: Secondary | ICD-10-CM | POA: Diagnosis not present

## 2019-11-17 DIAGNOSIS — M5416 Radiculopathy, lumbar region: Secondary | ICD-10-CM | POA: Diagnosis not present

## 2019-11-17 DIAGNOSIS — M1711 Unilateral primary osteoarthritis, right knee: Secondary | ICD-10-CM | POA: Diagnosis not present

## 2019-11-17 DIAGNOSIS — M10372 Gout due to renal impairment, left ankle and foot: Secondary | ICD-10-CM | POA: Diagnosis not present

## 2019-11-22 DIAGNOSIS — Z6841 Body Mass Index (BMI) 40.0 and over, adult: Secondary | ICD-10-CM | POA: Diagnosis not present

## 2019-11-22 DIAGNOSIS — E876 Hypokalemia: Secondary | ICD-10-CM | POA: Diagnosis not present

## 2019-11-22 DIAGNOSIS — E039 Hypothyroidism, unspecified: Secondary | ICD-10-CM | POA: Diagnosis not present

## 2019-11-22 DIAGNOSIS — N184 Chronic kidney disease, stage 4 (severe): Secondary | ICD-10-CM | POA: Diagnosis not present

## 2019-11-22 DIAGNOSIS — E1122 Type 2 diabetes mellitus with diabetic chronic kidney disease: Secondary | ICD-10-CM | POA: Diagnosis not present

## 2019-11-22 DIAGNOSIS — I10 Essential (primary) hypertension: Secondary | ICD-10-CM | POA: Diagnosis not present

## 2019-11-22 DIAGNOSIS — B0223 Postherpetic polyneuropathy: Secondary | ICD-10-CM | POA: Diagnosis not present

## 2019-11-22 DIAGNOSIS — M10372 Gout due to renal impairment, left ankle and foot: Secondary | ICD-10-CM | POA: Diagnosis not present

## 2019-12-13 DIAGNOSIS — I129 Hypertensive chronic kidney disease with stage 1 through stage 4 chronic kidney disease, or unspecified chronic kidney disease: Secondary | ICD-10-CM | POA: Diagnosis not present

## 2019-12-13 DIAGNOSIS — N184 Chronic kidney disease, stage 4 (severe): Secondary | ICD-10-CM | POA: Diagnosis not present

## 2019-12-13 DIAGNOSIS — E1122 Type 2 diabetes mellitus with diabetic chronic kidney disease: Secondary | ICD-10-CM | POA: Diagnosis not present

## 2019-12-13 DIAGNOSIS — E7849 Other hyperlipidemia: Secondary | ICD-10-CM | POA: Diagnosis not present

## 2020-02-14 DIAGNOSIS — M170 Bilateral primary osteoarthritis of knee: Secondary | ICD-10-CM | POA: Diagnosis not present

## 2020-02-15 DIAGNOSIS — E1165 Type 2 diabetes mellitus with hyperglycemia: Secondary | ICD-10-CM | POA: Diagnosis not present

## 2020-02-15 DIAGNOSIS — E782 Mixed hyperlipidemia: Secondary | ICD-10-CM | POA: Diagnosis not present

## 2020-02-15 DIAGNOSIS — N184 Chronic kidney disease, stage 4 (severe): Secondary | ICD-10-CM | POA: Diagnosis not present

## 2020-02-15 DIAGNOSIS — I10 Essential (primary) hypertension: Secondary | ICD-10-CM | POA: Diagnosis not present

## 2020-02-15 DIAGNOSIS — E039 Hypothyroidism, unspecified: Secondary | ICD-10-CM | POA: Diagnosis not present

## 2020-02-15 DIAGNOSIS — E1122 Type 2 diabetes mellitus with diabetic chronic kidney disease: Secondary | ICD-10-CM | POA: Diagnosis not present

## 2020-02-23 DIAGNOSIS — B0223 Postherpetic polyneuropathy: Secondary | ICD-10-CM | POA: Diagnosis not present

## 2020-02-23 DIAGNOSIS — I10 Essential (primary) hypertension: Secondary | ICD-10-CM | POA: Diagnosis not present

## 2020-02-23 DIAGNOSIS — E1122 Type 2 diabetes mellitus with diabetic chronic kidney disease: Secondary | ICD-10-CM | POA: Diagnosis not present

## 2020-02-23 DIAGNOSIS — Z6841 Body Mass Index (BMI) 40.0 and over, adult: Secondary | ICD-10-CM | POA: Diagnosis not present

## 2020-02-23 DIAGNOSIS — M5416 Radiculopathy, lumbar region: Secondary | ICD-10-CM | POA: Diagnosis not present

## 2020-02-23 DIAGNOSIS — E039 Hypothyroidism, unspecified: Secondary | ICD-10-CM | POA: Diagnosis not present

## 2020-03-14 DIAGNOSIS — I129 Hypertensive chronic kidney disease with stage 1 through stage 4 chronic kidney disease, or unspecified chronic kidney disease: Secondary | ICD-10-CM | POA: Diagnosis not present

## 2020-03-14 DIAGNOSIS — N184 Chronic kidney disease, stage 4 (severe): Secondary | ICD-10-CM | POA: Diagnosis not present

## 2020-03-14 DIAGNOSIS — E1122 Type 2 diabetes mellitus with diabetic chronic kidney disease: Secondary | ICD-10-CM | POA: Diagnosis not present

## 2020-03-14 DIAGNOSIS — E7849 Other hyperlipidemia: Secondary | ICD-10-CM | POA: Diagnosis not present

## 2020-03-18 DIAGNOSIS — M1711 Unilateral primary osteoarthritis, right knee: Secondary | ICD-10-CM | POA: Diagnosis not present

## 2020-03-18 DIAGNOSIS — M79631 Pain in right forearm: Secondary | ICD-10-CM | POA: Diagnosis not present

## 2020-03-18 DIAGNOSIS — Z981 Arthrodesis status: Secondary | ICD-10-CM | POA: Diagnosis not present

## 2020-03-18 DIAGNOSIS — N189 Chronic kidney disease, unspecified: Secondary | ICD-10-CM | POA: Diagnosis not present

## 2020-03-18 DIAGNOSIS — S300XXA Contusion of lower back and pelvis, initial encounter: Secondary | ICD-10-CM | POA: Diagnosis not present

## 2020-03-18 DIAGNOSIS — S20229A Contusion of unspecified back wall of thorax, initial encounter: Secondary | ICD-10-CM | POA: Diagnosis not present

## 2020-03-18 DIAGNOSIS — S50811A Abrasion of right forearm, initial encounter: Secondary | ICD-10-CM | POA: Diagnosis not present

## 2020-03-18 DIAGNOSIS — M545 Low back pain: Secondary | ICD-10-CM | POA: Diagnosis not present

## 2020-03-18 DIAGNOSIS — W010XXA Fall on same level from slipping, tripping and stumbling without subsequent striking against object, initial encounter: Secondary | ICD-10-CM | POA: Diagnosis not present

## 2020-03-18 DIAGNOSIS — S2231XA Fracture of one rib, right side, initial encounter for closed fracture: Secondary | ICD-10-CM | POA: Diagnosis not present

## 2020-03-18 DIAGNOSIS — M19041 Primary osteoarthritis, right hand: Secondary | ICD-10-CM | POA: Diagnosis not present

## 2020-03-18 DIAGNOSIS — E1122 Type 2 diabetes mellitus with diabetic chronic kidney disease: Secondary | ICD-10-CM | POA: Diagnosis not present

## 2020-03-18 DIAGNOSIS — I129 Hypertensive chronic kidney disease with stage 1 through stage 4 chronic kidney disease, or unspecified chronic kidney disease: Secondary | ICD-10-CM | POA: Diagnosis not present

## 2020-03-18 DIAGNOSIS — Z23 Encounter for immunization: Secondary | ICD-10-CM | POA: Diagnosis not present

## 2020-03-18 DIAGNOSIS — I700 Atherosclerosis of aorta: Secondary | ICD-10-CM | POA: Diagnosis not present

## 2020-03-18 DIAGNOSIS — S63601A Unspecified sprain of right thumb, initial encounter: Secondary | ICD-10-CM | POA: Diagnosis not present

## 2020-03-21 DIAGNOSIS — S8001XA Contusion of right knee, initial encounter: Secondary | ICD-10-CM | POA: Diagnosis not present

## 2020-03-21 DIAGNOSIS — S80211A Abrasion, right knee, initial encounter: Secondary | ICD-10-CM | POA: Diagnosis not present

## 2020-03-21 DIAGNOSIS — S40021A Contusion of right upper arm, initial encounter: Secondary | ICD-10-CM | POA: Diagnosis not present

## 2020-03-21 DIAGNOSIS — S40811A Abrasion of right upper arm, initial encounter: Secondary | ICD-10-CM | POA: Diagnosis not present

## 2020-03-30 DIAGNOSIS — S63601A Unspecified sprain of right thumb, initial encounter: Secondary | ICD-10-CM | POA: Diagnosis not present

## 2020-03-30 DIAGNOSIS — S40811A Abrasion of right upper arm, initial encounter: Secondary | ICD-10-CM | POA: Diagnosis not present

## 2020-03-30 DIAGNOSIS — Z6841 Body Mass Index (BMI) 40.0 and over, adult: Secondary | ICD-10-CM | POA: Diagnosis not present

## 2020-03-30 DIAGNOSIS — S8001XA Contusion of right knee, initial encounter: Secondary | ICD-10-CM | POA: Diagnosis not present

## 2020-03-30 DIAGNOSIS — S40021A Contusion of right upper arm, initial encounter: Secondary | ICD-10-CM | POA: Diagnosis not present

## 2020-03-30 DIAGNOSIS — Z23 Encounter for immunization: Secondary | ICD-10-CM | POA: Diagnosis not present

## 2020-03-30 DIAGNOSIS — S80211A Abrasion, right knee, initial encounter: Secondary | ICD-10-CM | POA: Diagnosis not present

## 2020-04-07 DIAGNOSIS — E1122 Type 2 diabetes mellitus with diabetic chronic kidney disease: Secondary | ICD-10-CM | POA: Diagnosis not present

## 2020-04-07 DIAGNOSIS — Z6828 Body mass index (BMI) 28.0-28.9, adult: Secondary | ICD-10-CM | POA: Diagnosis not present

## 2020-04-12 ENCOUNTER — Other Ambulatory Visit (HOSPITAL_COMMUNITY): Payer: Self-pay | Admitting: Family Medicine

## 2020-04-12 ENCOUNTER — Other Ambulatory Visit: Payer: Self-pay | Admitting: Family Medicine

## 2020-04-12 DIAGNOSIS — R790 Abnormal level of blood mineral: Secondary | ICD-10-CM | POA: Diagnosis not present

## 2020-04-12 DIAGNOSIS — R202 Paresthesia of skin: Secondary | ICD-10-CM | POA: Diagnosis not present

## 2020-04-12 DIAGNOSIS — R7989 Other specified abnormal findings of blood chemistry: Secondary | ICD-10-CM | POA: Diagnosis not present

## 2020-04-12 DIAGNOSIS — M79661 Pain in right lower leg: Secondary | ICD-10-CM

## 2020-04-12 DIAGNOSIS — Z6841 Body Mass Index (BMI) 40.0 and over, adult: Secondary | ICD-10-CM | POA: Diagnosis not present

## 2020-04-13 ENCOUNTER — Other Ambulatory Visit: Payer: Self-pay

## 2020-04-13 ENCOUNTER — Ambulatory Visit (HOSPITAL_COMMUNITY)
Admission: RE | Admit: 2020-04-13 | Discharge: 2020-04-13 | Disposition: A | Discharge status: Home / Self Care | Payer: Medicare HMO | Source: Ambulatory Visit | Attending: Family Medicine | Admitting: Family Medicine

## 2020-04-13 DIAGNOSIS — M79661 Pain in right lower leg: Secondary | ICD-10-CM | POA: Insufficient documentation

## 2020-04-13 DIAGNOSIS — E7849 Other hyperlipidemia: Secondary | ICD-10-CM | POA: Diagnosis not present

## 2020-04-13 DIAGNOSIS — E1122 Type 2 diabetes mellitus with diabetic chronic kidney disease: Secondary | ICD-10-CM | POA: Diagnosis not present

## 2020-04-13 DIAGNOSIS — I129 Hypertensive chronic kidney disease with stage 1 through stage 4 chronic kidney disease, or unspecified chronic kidney disease: Secondary | ICD-10-CM | POA: Diagnosis not present

## 2020-04-13 DIAGNOSIS — N184 Chronic kidney disease, stage 4 (severe): Secondary | ICD-10-CM | POA: Diagnosis not present

## 2020-04-19 DIAGNOSIS — S8001XA Contusion of right knee, initial encounter: Secondary | ICD-10-CM | POA: Diagnosis not present

## 2020-04-19 DIAGNOSIS — M79644 Pain in right finger(s): Secondary | ICD-10-CM | POA: Diagnosis not present

## 2020-04-21 DIAGNOSIS — M79644 Pain in right finger(s): Secondary | ICD-10-CM | POA: Diagnosis not present

## 2020-04-21 DIAGNOSIS — S8001XA Contusion of right knee, initial encounter: Secondary | ICD-10-CM | POA: Diagnosis not present

## 2020-04-25 DIAGNOSIS — M79644 Pain in right finger(s): Secondary | ICD-10-CM | POA: Diagnosis not present

## 2020-04-25 DIAGNOSIS — S8001XA Contusion of right knee, initial encounter: Secondary | ICD-10-CM | POA: Diagnosis not present

## 2020-04-28 DIAGNOSIS — M79644 Pain in right finger(s): Secondary | ICD-10-CM | POA: Diagnosis not present

## 2020-04-28 DIAGNOSIS — S8001XA Contusion of right knee, initial encounter: Secondary | ICD-10-CM | POA: Diagnosis not present

## 2020-05-02 DIAGNOSIS — S8001XA Contusion of right knee, initial encounter: Secondary | ICD-10-CM | POA: Diagnosis not present

## 2020-05-02 DIAGNOSIS — M79644 Pain in right finger(s): Secondary | ICD-10-CM | POA: Diagnosis not present

## 2020-05-05 DIAGNOSIS — M79644 Pain in right finger(s): Secondary | ICD-10-CM | POA: Diagnosis not present

## 2020-05-05 DIAGNOSIS — S8001XA Contusion of right knee, initial encounter: Secondary | ICD-10-CM | POA: Diagnosis not present

## 2020-05-09 DIAGNOSIS — M79644 Pain in right finger(s): Secondary | ICD-10-CM | POA: Diagnosis not present

## 2020-05-09 DIAGNOSIS — S8001XA Contusion of right knee, initial encounter: Secondary | ICD-10-CM | POA: Diagnosis not present

## 2020-05-12 DIAGNOSIS — M79644 Pain in right finger(s): Secondary | ICD-10-CM | POA: Diagnosis not present

## 2020-05-12 DIAGNOSIS — S8001XA Contusion of right knee, initial encounter: Secondary | ICD-10-CM | POA: Diagnosis not present

## 2020-05-13 DIAGNOSIS — N184 Chronic kidney disease, stage 4 (severe): Secondary | ICD-10-CM | POA: Diagnosis not present

## 2020-05-13 DIAGNOSIS — E1122 Type 2 diabetes mellitus with diabetic chronic kidney disease: Secondary | ICD-10-CM | POA: Diagnosis not present

## 2020-05-13 DIAGNOSIS — E7849 Other hyperlipidemia: Secondary | ICD-10-CM | POA: Diagnosis not present

## 2020-05-13 DIAGNOSIS — I129 Hypertensive chronic kidney disease with stage 1 through stage 4 chronic kidney disease, or unspecified chronic kidney disease: Secondary | ICD-10-CM | POA: Diagnosis not present

## 2020-05-17 DIAGNOSIS — I10 Essential (primary) hypertension: Secondary | ICD-10-CM | POA: Diagnosis not present

## 2020-05-17 DIAGNOSIS — N184 Chronic kidney disease, stage 4 (severe): Secondary | ICD-10-CM | POA: Diagnosis not present

## 2020-05-17 DIAGNOSIS — E1122 Type 2 diabetes mellitus with diabetic chronic kidney disease: Secondary | ICD-10-CM | POA: Diagnosis not present

## 2020-05-17 DIAGNOSIS — E1165 Type 2 diabetes mellitus with hyperglycemia: Secondary | ICD-10-CM | POA: Diagnosis not present

## 2020-05-17 DIAGNOSIS — E782 Mixed hyperlipidemia: Secondary | ICD-10-CM | POA: Diagnosis not present

## 2020-05-17 DIAGNOSIS — E559 Vitamin D deficiency, unspecified: Secondary | ICD-10-CM | POA: Diagnosis not present

## 2020-05-18 DIAGNOSIS — S8001XA Contusion of right knee, initial encounter: Secondary | ICD-10-CM | POA: Diagnosis not present

## 2020-05-18 DIAGNOSIS — M79644 Pain in right finger(s): Secondary | ICD-10-CM | POA: Diagnosis not present

## 2020-05-23 DIAGNOSIS — E039 Hypothyroidism, unspecified: Secondary | ICD-10-CM | POA: Diagnosis not present

## 2020-05-23 DIAGNOSIS — I10 Essential (primary) hypertension: Secondary | ICD-10-CM | POA: Diagnosis not present

## 2020-05-23 DIAGNOSIS — E1122 Type 2 diabetes mellitus with diabetic chronic kidney disease: Secondary | ICD-10-CM | POA: Diagnosis not present

## 2020-05-23 DIAGNOSIS — B0223 Postherpetic polyneuropathy: Secondary | ICD-10-CM | POA: Diagnosis not present

## 2020-05-23 DIAGNOSIS — K5900 Constipation, unspecified: Secondary | ICD-10-CM | POA: Diagnosis not present

## 2020-05-23 DIAGNOSIS — Z6841 Body Mass Index (BMI) 40.0 and over, adult: Secondary | ICD-10-CM | POA: Diagnosis not present

## 2020-06-02 DIAGNOSIS — J301 Allergic rhinitis due to pollen: Secondary | ICD-10-CM | POA: Diagnosis not present

## 2020-06-13 DIAGNOSIS — E7849 Other hyperlipidemia: Secondary | ICD-10-CM | POA: Diagnosis not present

## 2020-06-13 DIAGNOSIS — I129 Hypertensive chronic kidney disease with stage 1 through stage 4 chronic kidney disease, or unspecified chronic kidney disease: Secondary | ICD-10-CM | POA: Diagnosis not present

## 2020-06-13 DIAGNOSIS — N184 Chronic kidney disease, stage 4 (severe): Secondary | ICD-10-CM | POA: Diagnosis not present

## 2020-06-13 DIAGNOSIS — E1122 Type 2 diabetes mellitus with diabetic chronic kidney disease: Secondary | ICD-10-CM | POA: Diagnosis not present

## 2020-06-30 DIAGNOSIS — Z6841 Body Mass Index (BMI) 40.0 and over, adult: Secondary | ICD-10-CM | POA: Diagnosis not present

## 2020-06-30 DIAGNOSIS — S161XXA Strain of muscle, fascia and tendon at neck level, initial encounter: Secondary | ICD-10-CM | POA: Diagnosis not present

## 2020-07-14 DIAGNOSIS — N184 Chronic kidney disease, stage 4 (severe): Secondary | ICD-10-CM | POA: Diagnosis not present

## 2020-07-14 DIAGNOSIS — I129 Hypertensive chronic kidney disease with stage 1 through stage 4 chronic kidney disease, or unspecified chronic kidney disease: Secondary | ICD-10-CM | POA: Diagnosis not present

## 2020-07-14 DIAGNOSIS — E7849 Other hyperlipidemia: Secondary | ICD-10-CM | POA: Diagnosis not present

## 2020-07-14 DIAGNOSIS — E1122 Type 2 diabetes mellitus with diabetic chronic kidney disease: Secondary | ICD-10-CM | POA: Diagnosis not present

## 2020-08-12 DIAGNOSIS — I129 Hypertensive chronic kidney disease with stage 1 through stage 4 chronic kidney disease, or unspecified chronic kidney disease: Secondary | ICD-10-CM | POA: Diagnosis not present

## 2020-08-12 DIAGNOSIS — E7849 Other hyperlipidemia: Secondary | ICD-10-CM | POA: Diagnosis not present

## 2020-08-12 DIAGNOSIS — E1122 Type 2 diabetes mellitus with diabetic chronic kidney disease: Secondary | ICD-10-CM | POA: Diagnosis not present

## 2020-08-12 DIAGNOSIS — N184 Chronic kidney disease, stage 4 (severe): Secondary | ICD-10-CM | POA: Diagnosis not present

## 2020-09-01 DIAGNOSIS — I10 Essential (primary) hypertension: Secondary | ICD-10-CM | POA: Diagnosis not present

## 2020-09-01 DIAGNOSIS — E1165 Type 2 diabetes mellitus with hyperglycemia: Secondary | ICD-10-CM | POA: Diagnosis not present

## 2020-09-01 DIAGNOSIS — E1122 Type 2 diabetes mellitus with diabetic chronic kidney disease: Secondary | ICD-10-CM | POA: Diagnosis not present

## 2020-09-01 DIAGNOSIS — N184 Chronic kidney disease, stage 4 (severe): Secondary | ICD-10-CM | POA: Diagnosis not present

## 2020-09-01 DIAGNOSIS — E782 Mixed hyperlipidemia: Secondary | ICD-10-CM | POA: Diagnosis not present

## 2020-09-01 DIAGNOSIS — E039 Hypothyroidism, unspecified: Secondary | ICD-10-CM | POA: Diagnosis not present

## 2020-09-01 DIAGNOSIS — E7849 Other hyperlipidemia: Secondary | ICD-10-CM | POA: Diagnosis not present

## 2020-09-06 DIAGNOSIS — N184 Chronic kidney disease, stage 4 (severe): Secondary | ICD-10-CM | POA: Diagnosis not present

## 2020-09-06 DIAGNOSIS — Z6841 Body Mass Index (BMI) 40.0 and over, adult: Secondary | ICD-10-CM | POA: Diagnosis not present

## 2020-09-06 DIAGNOSIS — I10 Essential (primary) hypertension: Secondary | ICD-10-CM | POA: Diagnosis not present

## 2020-09-06 DIAGNOSIS — K5900 Constipation, unspecified: Secondary | ICD-10-CM | POA: Diagnosis not present

## 2020-09-06 DIAGNOSIS — E039 Hypothyroidism, unspecified: Secondary | ICD-10-CM | POA: Diagnosis not present

## 2020-09-06 DIAGNOSIS — B0223 Postherpetic polyneuropathy: Secondary | ICD-10-CM | POA: Diagnosis not present

## 2020-09-06 DIAGNOSIS — E1122 Type 2 diabetes mellitus with diabetic chronic kidney disease: Secondary | ICD-10-CM | POA: Diagnosis not present

## 2020-09-11 DIAGNOSIS — N184 Chronic kidney disease, stage 4 (severe): Secondary | ICD-10-CM | POA: Diagnosis not present

## 2020-09-11 DIAGNOSIS — E7849 Other hyperlipidemia: Secondary | ICD-10-CM | POA: Diagnosis not present

## 2020-09-11 DIAGNOSIS — I129 Hypertensive chronic kidney disease with stage 1 through stage 4 chronic kidney disease, or unspecified chronic kidney disease: Secondary | ICD-10-CM | POA: Diagnosis not present

## 2020-09-11 DIAGNOSIS — E1122 Type 2 diabetes mellitus with diabetic chronic kidney disease: Secondary | ICD-10-CM | POA: Diagnosis not present

## 2020-09-15 ENCOUNTER — Other Ambulatory Visit (HOSPITAL_COMMUNITY): Payer: Self-pay | Admitting: *Deleted

## 2020-09-15 ENCOUNTER — Other Ambulatory Visit: Payer: Self-pay

## 2020-09-15 ENCOUNTER — Other Ambulatory Visit: Payer: Self-pay | Admitting: *Deleted

## 2020-09-15 ENCOUNTER — Other Ambulatory Visit: Payer: Self-pay | Admitting: Nephrology

## 2020-09-15 DIAGNOSIS — I129 Hypertensive chronic kidney disease with stage 1 through stage 4 chronic kidney disease, or unspecified chronic kidney disease: Secondary | ICD-10-CM | POA: Diagnosis not present

## 2020-09-15 DIAGNOSIS — E1122 Type 2 diabetes mellitus with diabetic chronic kidney disease: Secondary | ICD-10-CM | POA: Diagnosis not present

## 2020-09-15 DIAGNOSIS — Z79899 Other long term (current) drug therapy: Secondary | ICD-10-CM | POA: Diagnosis not present

## 2020-09-15 DIAGNOSIS — E876 Hypokalemia: Secondary | ICD-10-CM | POA: Diagnosis not present

## 2020-09-15 DIAGNOSIS — N289 Disorder of kidney and ureter, unspecified: Secondary | ICD-10-CM

## 2020-09-15 DIAGNOSIS — Z7189 Other specified counseling: Secondary | ICD-10-CM | POA: Diagnosis not present

## 2020-09-15 DIAGNOSIS — N189 Chronic kidney disease, unspecified: Secondary | ICD-10-CM | POA: Diagnosis not present

## 2020-09-15 DIAGNOSIS — E871 Hypo-osmolality and hyponatremia: Secondary | ICD-10-CM | POA: Diagnosis not present

## 2020-09-19 DIAGNOSIS — Z79899 Other long term (current) drug therapy: Secondary | ICD-10-CM | POA: Diagnosis not present

## 2020-09-19 DIAGNOSIS — Z7189 Other specified counseling: Secondary | ICD-10-CM | POA: Diagnosis not present

## 2020-09-19 DIAGNOSIS — I129 Hypertensive chronic kidney disease with stage 1 through stage 4 chronic kidney disease, or unspecified chronic kidney disease: Secondary | ICD-10-CM | POA: Diagnosis not present

## 2020-09-19 DIAGNOSIS — E871 Hypo-osmolality and hyponatremia: Secondary | ICD-10-CM | POA: Diagnosis not present

## 2020-09-19 DIAGNOSIS — E876 Hypokalemia: Secondary | ICD-10-CM | POA: Diagnosis not present

## 2020-09-19 DIAGNOSIS — E1122 Type 2 diabetes mellitus with diabetic chronic kidney disease: Secondary | ICD-10-CM | POA: Diagnosis not present

## 2020-09-19 DIAGNOSIS — N189 Chronic kidney disease, unspecified: Secondary | ICD-10-CM | POA: Diagnosis not present

## 2020-09-21 ENCOUNTER — Other Ambulatory Visit: Payer: Self-pay

## 2020-09-21 ENCOUNTER — Ambulatory Visit (HOSPITAL_COMMUNITY)
Admission: RE | Admit: 2020-09-21 | Discharge: 2020-09-21 | Disposition: A | Discharge status: Home / Self Care | Payer: Medicare HMO | Source: Ambulatory Visit | Attending: Nephrology | Admitting: Nephrology

## 2020-09-21 DIAGNOSIS — N281 Cyst of kidney, acquired: Secondary | ICD-10-CM | POA: Diagnosis not present

## 2020-09-21 DIAGNOSIS — N289 Disorder of kidney and ureter, unspecified: Secondary | ICD-10-CM | POA: Insufficient documentation

## 2020-09-21 DIAGNOSIS — N189 Chronic kidney disease, unspecified: Secondary | ICD-10-CM | POA: Diagnosis not present

## 2020-09-25 DIAGNOSIS — M170 Bilateral primary osteoarthritis of knee: Secondary | ICD-10-CM | POA: Diagnosis not present

## 2020-09-25 DIAGNOSIS — Z1211 Encounter for screening for malignant neoplasm of colon: Secondary | ICD-10-CM | POA: Diagnosis not present

## 2020-10-13 DIAGNOSIS — E1122 Type 2 diabetes mellitus with diabetic chronic kidney disease: Secondary | ICD-10-CM | POA: Diagnosis not present

## 2020-10-13 DIAGNOSIS — N281 Cyst of kidney, acquired: Secondary | ICD-10-CM | POA: Diagnosis not present

## 2020-10-13 DIAGNOSIS — N189 Chronic kidney disease, unspecified: Secondary | ICD-10-CM | POA: Diagnosis not present

## 2020-10-13 DIAGNOSIS — E871 Hypo-osmolality and hyponatremia: Secondary | ICD-10-CM | POA: Diagnosis not present

## 2020-10-13 DIAGNOSIS — R768 Other specified abnormal immunological findings in serum: Secondary | ICD-10-CM | POA: Diagnosis not present

## 2020-10-13 DIAGNOSIS — E876 Hypokalemia: Secondary | ICD-10-CM | POA: Diagnosis not present

## 2020-10-20 DIAGNOSIS — E876 Hypokalemia: Secondary | ICD-10-CM | POA: Diagnosis not present

## 2020-10-20 DIAGNOSIS — E1122 Type 2 diabetes mellitus with diabetic chronic kidney disease: Secondary | ICD-10-CM | POA: Diagnosis not present

## 2020-10-20 DIAGNOSIS — N189 Chronic kidney disease, unspecified: Secondary | ICD-10-CM | POA: Diagnosis not present

## 2020-10-20 DIAGNOSIS — R768 Other specified abnormal immunological findings in serum: Secondary | ICD-10-CM | POA: Diagnosis not present

## 2020-10-20 DIAGNOSIS — E871 Hypo-osmolality and hyponatremia: Secondary | ICD-10-CM | POA: Diagnosis not present

## 2020-10-20 DIAGNOSIS — N281 Cyst of kidney, acquired: Secondary | ICD-10-CM | POA: Diagnosis not present

## 2020-10-24 DIAGNOSIS — Z01818 Encounter for other preprocedural examination: Secondary | ICD-10-CM | POA: Diagnosis not present

## 2020-10-26 DIAGNOSIS — Z7982 Long term (current) use of aspirin: Secondary | ICD-10-CM | POA: Diagnosis not present

## 2020-10-26 DIAGNOSIS — I129 Hypertensive chronic kidney disease with stage 1 through stage 4 chronic kidney disease, or unspecified chronic kidney disease: Secondary | ICD-10-CM | POA: Diagnosis not present

## 2020-10-26 DIAGNOSIS — E785 Hyperlipidemia, unspecified: Secondary | ICD-10-CM | POA: Diagnosis not present

## 2020-10-26 DIAGNOSIS — Z1211 Encounter for screening for malignant neoplasm of colon: Secondary | ICD-10-CM | POA: Diagnosis not present

## 2020-10-26 DIAGNOSIS — K641 Second degree hemorrhoids: Secondary | ICD-10-CM | POA: Diagnosis not present

## 2020-10-26 DIAGNOSIS — N184 Chronic kidney disease, stage 4 (severe): Secondary | ICD-10-CM | POA: Diagnosis not present

## 2020-10-26 DIAGNOSIS — Z87891 Personal history of nicotine dependence: Secondary | ICD-10-CM | POA: Diagnosis not present

## 2020-10-26 DIAGNOSIS — Z7984 Long term (current) use of oral hypoglycemic drugs: Secondary | ICD-10-CM | POA: Diagnosis not present

## 2020-10-26 DIAGNOSIS — E1122 Type 2 diabetes mellitus with diabetic chronic kidney disease: Secondary | ICD-10-CM | POA: Diagnosis not present

## 2020-10-27 DIAGNOSIS — I129 Hypertensive chronic kidney disease with stage 1 through stage 4 chronic kidney disease, or unspecified chronic kidney disease: Secondary | ICD-10-CM | POA: Diagnosis not present

## 2020-10-27 DIAGNOSIS — E1122 Type 2 diabetes mellitus with diabetic chronic kidney disease: Secondary | ICD-10-CM | POA: Diagnosis not present

## 2020-10-27 DIAGNOSIS — Z79899 Other long term (current) drug therapy: Secondary | ICD-10-CM | POA: Diagnosis not present

## 2020-10-27 DIAGNOSIS — N189 Chronic kidney disease, unspecified: Secondary | ICD-10-CM | POA: Diagnosis not present

## 2020-10-27 DIAGNOSIS — M109 Gout, unspecified: Secondary | ICD-10-CM | POA: Diagnosis not present

## 2020-10-27 DIAGNOSIS — E785 Hyperlipidemia, unspecified: Secondary | ICD-10-CM | POA: Diagnosis not present

## 2020-10-27 DIAGNOSIS — E039 Hypothyroidism, unspecified: Secondary | ICD-10-CM | POA: Diagnosis not present

## 2020-10-27 DIAGNOSIS — Z87891 Personal history of nicotine dependence: Secondary | ICD-10-CM | POA: Diagnosis not present

## 2020-10-27 DIAGNOSIS — R0989 Other specified symptoms and signs involving the circulatory and respiratory systems: Secondary | ICD-10-CM | POA: Diagnosis not present

## 2020-10-27 DIAGNOSIS — T17208A Unspecified foreign body in pharynx causing other injury, initial encounter: Secondary | ICD-10-CM | POA: Diagnosis not present

## 2020-11-15 DIAGNOSIS — K641 Second degree hemorrhoids: Secondary | ICD-10-CM | POA: Diagnosis not present

## 2020-11-26 DIAGNOSIS — N179 Acute kidney failure, unspecified: Secondary | ICD-10-CM | POA: Diagnosis not present

## 2020-11-26 DIAGNOSIS — E876 Hypokalemia: Secondary | ICD-10-CM | POA: Diagnosis not present

## 2020-11-26 DIAGNOSIS — R42 Dizziness and giddiness: Secondary | ICD-10-CM | POA: Diagnosis not present

## 2020-11-26 DIAGNOSIS — Z87891 Personal history of nicotine dependence: Secondary | ICD-10-CM | POA: Diagnosis not present

## 2020-11-26 DIAGNOSIS — E785 Hyperlipidemia, unspecified: Secondary | ICD-10-CM | POA: Diagnosis not present

## 2020-11-26 DIAGNOSIS — E1122 Type 2 diabetes mellitus with diabetic chronic kidney disease: Secondary | ICD-10-CM | POA: Diagnosis not present

## 2020-11-26 DIAGNOSIS — R059 Cough, unspecified: Secondary | ICD-10-CM | POA: Diagnosis not present

## 2020-11-26 DIAGNOSIS — R519 Headache, unspecified: Secondary | ICD-10-CM | POA: Diagnosis not present

## 2020-11-26 DIAGNOSIS — N189 Chronic kidney disease, unspecified: Secondary | ICD-10-CM | POA: Diagnosis not present

## 2020-11-26 DIAGNOSIS — I129 Hypertensive chronic kidney disease with stage 1 through stage 4 chronic kidney disease, or unspecified chronic kidney disease: Secondary | ICD-10-CM | POA: Diagnosis not present

## 2020-11-26 DIAGNOSIS — Z20822 Contact with and (suspected) exposure to covid-19: Secondary | ICD-10-CM | POA: Diagnosis not present

## 2020-11-28 DIAGNOSIS — I10 Essential (primary) hypertension: Secondary | ICD-10-CM | POA: Diagnosis not present

## 2020-11-28 DIAGNOSIS — E1165 Type 2 diabetes mellitus with hyperglycemia: Secondary | ICD-10-CM | POA: Diagnosis not present

## 2020-12-01 DIAGNOSIS — B0223 Postherpetic polyneuropathy: Secondary | ICD-10-CM | POA: Diagnosis not present

## 2020-12-01 DIAGNOSIS — E1122 Type 2 diabetes mellitus with diabetic chronic kidney disease: Secondary | ICD-10-CM | POA: Diagnosis not present

## 2020-12-01 DIAGNOSIS — I10 Essential (primary) hypertension: Secondary | ICD-10-CM | POA: Diagnosis not present

## 2020-12-01 DIAGNOSIS — Z0001 Encounter for general adult medical examination with abnormal findings: Secondary | ICD-10-CM | POA: Diagnosis not present

## 2020-12-01 DIAGNOSIS — E876 Hypokalemia: Secondary | ICD-10-CM | POA: Diagnosis not present

## 2020-12-01 DIAGNOSIS — I959 Hypotension, unspecified: Secondary | ICD-10-CM | POA: Diagnosis not present

## 2020-12-01 DIAGNOSIS — E039 Hypothyroidism, unspecified: Secondary | ICD-10-CM | POA: Diagnosis not present

## 2020-12-11 DIAGNOSIS — I129 Hypertensive chronic kidney disease with stage 1 through stage 4 chronic kidney disease, or unspecified chronic kidney disease: Secondary | ICD-10-CM | POA: Diagnosis not present

## 2020-12-11 DIAGNOSIS — E1122 Type 2 diabetes mellitus with diabetic chronic kidney disease: Secondary | ICD-10-CM | POA: Diagnosis not present

## 2020-12-11 DIAGNOSIS — E7849 Other hyperlipidemia: Secondary | ICD-10-CM | POA: Diagnosis not present

## 2020-12-11 DIAGNOSIS — N184 Chronic kidney disease, stage 4 (severe): Secondary | ICD-10-CM | POA: Diagnosis not present

## 2020-12-13 DIAGNOSIS — E1122 Type 2 diabetes mellitus with diabetic chronic kidney disease: Secondary | ICD-10-CM | POA: Diagnosis not present

## 2020-12-13 DIAGNOSIS — N189 Chronic kidney disease, unspecified: Secondary | ICD-10-CM | POA: Diagnosis not present

## 2020-12-13 DIAGNOSIS — E871 Hypo-osmolality and hyponatremia: Secondary | ICD-10-CM | POA: Diagnosis not present

## 2020-12-13 DIAGNOSIS — E876 Hypokalemia: Secondary | ICD-10-CM | POA: Diagnosis not present

## 2020-12-13 DIAGNOSIS — N170 Acute kidney failure with tubular necrosis: Secondary | ICD-10-CM | POA: Diagnosis not present

## 2020-12-18 DIAGNOSIS — E1122 Type 2 diabetes mellitus with diabetic chronic kidney disease: Secondary | ICD-10-CM | POA: Diagnosis not present

## 2020-12-18 DIAGNOSIS — N189 Chronic kidney disease, unspecified: Secondary | ICD-10-CM | POA: Diagnosis not present

## 2020-12-18 DIAGNOSIS — N170 Acute kidney failure with tubular necrosis: Secondary | ICD-10-CM | POA: Diagnosis not present

## 2020-12-18 DIAGNOSIS — E871 Hypo-osmolality and hyponatremia: Secondary | ICD-10-CM | POA: Diagnosis not present

## 2020-12-18 DIAGNOSIS — E876 Hypokalemia: Secondary | ICD-10-CM | POA: Diagnosis not present

## 2020-12-20 DIAGNOSIS — I129 Hypertensive chronic kidney disease with stage 1 through stage 4 chronic kidney disease, or unspecified chronic kidney disease: Secondary | ICD-10-CM | POA: Diagnosis not present

## 2020-12-20 DIAGNOSIS — E871 Hypo-osmolality and hyponatremia: Secondary | ICD-10-CM | POA: Diagnosis not present

## 2020-12-20 DIAGNOSIS — N170 Acute kidney failure with tubular necrosis: Secondary | ICD-10-CM | POA: Diagnosis not present

## 2020-12-20 DIAGNOSIS — E1122 Type 2 diabetes mellitus with diabetic chronic kidney disease: Secondary | ICD-10-CM | POA: Diagnosis not present

## 2020-12-20 DIAGNOSIS — E876 Hypokalemia: Secondary | ICD-10-CM | POA: Diagnosis not present

## 2020-12-20 DIAGNOSIS — N189 Chronic kidney disease, unspecified: Secondary | ICD-10-CM | POA: Diagnosis not present

## 2021-01-11 DIAGNOSIS — E7849 Other hyperlipidemia: Secondary | ICD-10-CM | POA: Diagnosis not present

## 2021-01-11 DIAGNOSIS — E1122 Type 2 diabetes mellitus with diabetic chronic kidney disease: Secondary | ICD-10-CM | POA: Diagnosis not present

## 2021-01-11 DIAGNOSIS — N184 Chronic kidney disease, stage 4 (severe): Secondary | ICD-10-CM | POA: Diagnosis not present

## 2021-01-11 DIAGNOSIS — I129 Hypertensive chronic kidney disease with stage 1 through stage 4 chronic kidney disease, or unspecified chronic kidney disease: Secondary | ICD-10-CM | POA: Diagnosis not present

## 2021-02-11 DIAGNOSIS — E1122 Type 2 diabetes mellitus with diabetic chronic kidney disease: Secondary | ICD-10-CM | POA: Diagnosis not present

## 2021-02-11 DIAGNOSIS — E7849 Other hyperlipidemia: Secondary | ICD-10-CM | POA: Diagnosis not present

## 2021-02-11 DIAGNOSIS — N184 Chronic kidney disease, stage 4 (severe): Secondary | ICD-10-CM | POA: Diagnosis not present

## 2021-02-11 DIAGNOSIS — I129 Hypertensive chronic kidney disease with stage 1 through stage 4 chronic kidney disease, or unspecified chronic kidney disease: Secondary | ICD-10-CM | POA: Diagnosis not present

## 2021-02-12 DIAGNOSIS — N170 Acute kidney failure with tubular necrosis: Secondary | ICD-10-CM | POA: Diagnosis not present

## 2021-02-12 DIAGNOSIS — E871 Hypo-osmolality and hyponatremia: Secondary | ICD-10-CM | POA: Diagnosis not present

## 2021-02-12 DIAGNOSIS — E1122 Type 2 diabetes mellitus with diabetic chronic kidney disease: Secondary | ICD-10-CM | POA: Diagnosis not present

## 2021-02-12 DIAGNOSIS — N189 Chronic kidney disease, unspecified: Secondary | ICD-10-CM | POA: Diagnosis not present

## 2021-02-12 DIAGNOSIS — I129 Hypertensive chronic kidney disease with stage 1 through stage 4 chronic kidney disease, or unspecified chronic kidney disease: Secondary | ICD-10-CM | POA: Diagnosis not present

## 2021-02-12 DIAGNOSIS — E876 Hypokalemia: Secondary | ICD-10-CM | POA: Diagnosis not present

## 2021-02-17 DIAGNOSIS — R69 Illness, unspecified: Secondary | ICD-10-CM | POA: Diagnosis not present

## 2021-02-17 DIAGNOSIS — Z5321 Procedure and treatment not carried out due to patient leaving prior to being seen by health care provider: Secondary | ICD-10-CM | POA: Diagnosis not present

## 2021-02-17 DIAGNOSIS — Z20822 Contact with and (suspected) exposure to covid-19: Secondary | ICD-10-CM | POA: Diagnosis not present

## 2021-02-21 DIAGNOSIS — I10 Essential (primary) hypertension: Secondary | ICD-10-CM | POA: Diagnosis not present

## 2021-02-21 DIAGNOSIS — E782 Mixed hyperlipidemia: Secondary | ICD-10-CM | POA: Diagnosis not present

## 2021-02-21 DIAGNOSIS — E7849 Other hyperlipidemia: Secondary | ICD-10-CM | POA: Diagnosis not present

## 2021-02-21 DIAGNOSIS — E039 Hypothyroidism, unspecified: Secondary | ICD-10-CM | POA: Diagnosis not present

## 2021-02-21 DIAGNOSIS — E876 Hypokalemia: Secondary | ICD-10-CM | POA: Diagnosis not present

## 2021-02-21 DIAGNOSIS — E1122 Type 2 diabetes mellitus with diabetic chronic kidney disease: Secondary | ICD-10-CM | POA: Diagnosis not present

## 2021-02-21 DIAGNOSIS — N184 Chronic kidney disease, stage 4 (severe): Secondary | ICD-10-CM | POA: Diagnosis not present

## 2021-02-22 DIAGNOSIS — I129 Hypertensive chronic kidney disease with stage 1 through stage 4 chronic kidney disease, or unspecified chronic kidney disease: Secondary | ICD-10-CM | POA: Diagnosis not present

## 2021-02-22 DIAGNOSIS — R768 Other specified abnormal immunological findings in serum: Secondary | ICD-10-CM | POA: Diagnosis not present

## 2021-02-22 DIAGNOSIS — N189 Chronic kidney disease, unspecified: Secondary | ICD-10-CM | POA: Diagnosis not present

## 2021-02-22 DIAGNOSIS — E1122 Type 2 diabetes mellitus with diabetic chronic kidney disease: Secondary | ICD-10-CM | POA: Diagnosis not present

## 2021-02-28 DIAGNOSIS — E782 Mixed hyperlipidemia: Secondary | ICD-10-CM | POA: Diagnosis not present

## 2021-02-28 DIAGNOSIS — I10 Essential (primary) hypertension: Secondary | ICD-10-CM | POA: Diagnosis not present

## 2021-02-28 DIAGNOSIS — E039 Hypothyroidism, unspecified: Secondary | ICD-10-CM | POA: Diagnosis not present

## 2021-02-28 DIAGNOSIS — E1122 Type 2 diabetes mellitus with diabetic chronic kidney disease: Secondary | ICD-10-CM | POA: Diagnosis not present

## 2021-02-28 DIAGNOSIS — E876 Hypokalemia: Secondary | ICD-10-CM | POA: Diagnosis not present

## 2021-02-28 DIAGNOSIS — I358 Other nonrheumatic aortic valve disorders: Secondary | ICD-10-CM | POA: Diagnosis not present

## 2021-02-28 DIAGNOSIS — B0223 Postherpetic polyneuropathy: Secondary | ICD-10-CM | POA: Diagnosis not present

## 2021-03-12 DIAGNOSIS — R0989 Other specified symptoms and signs involving the circulatory and respiratory systems: Secondary | ICD-10-CM | POA: Diagnosis not present

## 2021-03-12 DIAGNOSIS — I082 Rheumatic disorders of both aortic and tricuspid valves: Secondary | ICD-10-CM | POA: Diagnosis not present

## 2021-03-12 DIAGNOSIS — R011 Cardiac murmur, unspecified: Secondary | ICD-10-CM | POA: Diagnosis not present

## 2021-03-12 DIAGNOSIS — I358 Other nonrheumatic aortic valve disorders: Secondary | ICD-10-CM | POA: Diagnosis not present

## 2021-03-14 DIAGNOSIS — I129 Hypertensive chronic kidney disease with stage 1 through stage 4 chronic kidney disease, or unspecified chronic kidney disease: Secondary | ICD-10-CM | POA: Diagnosis not present

## 2021-03-14 DIAGNOSIS — E1122 Type 2 diabetes mellitus with diabetic chronic kidney disease: Secondary | ICD-10-CM | POA: Diagnosis not present

## 2021-03-14 DIAGNOSIS — E7849 Other hyperlipidemia: Secondary | ICD-10-CM | POA: Diagnosis not present

## 2021-03-14 DIAGNOSIS — N184 Chronic kidney disease, stage 4 (severe): Secondary | ICD-10-CM | POA: Diagnosis not present

## 2021-05-14 DIAGNOSIS — E1122 Type 2 diabetes mellitus with diabetic chronic kidney disease: Secondary | ICD-10-CM | POA: Diagnosis not present

## 2021-05-14 DIAGNOSIS — E7849 Other hyperlipidemia: Secondary | ICD-10-CM | POA: Diagnosis not present

## 2021-05-14 DIAGNOSIS — N184 Chronic kidney disease, stage 4 (severe): Secondary | ICD-10-CM | POA: Diagnosis not present

## 2021-05-14 DIAGNOSIS — I129 Hypertensive chronic kidney disease with stage 1 through stage 4 chronic kidney disease, or unspecified chronic kidney disease: Secondary | ICD-10-CM | POA: Diagnosis not present

## 2021-05-16 DIAGNOSIS — H524 Presbyopia: Secondary | ICD-10-CM | POA: Diagnosis not present

## 2021-05-16 DIAGNOSIS — H35032 Hypertensive retinopathy, left eye: Secondary | ICD-10-CM | POA: Diagnosis not present

## 2021-05-16 DIAGNOSIS — Z0100 Encounter for examination of eyes and vision without abnormal findings: Secondary | ICD-10-CM | POA: Diagnosis not present

## 2021-05-21 DIAGNOSIS — M170 Bilateral primary osteoarthritis of knee: Secondary | ICD-10-CM | POA: Diagnosis not present

## 2021-05-22 DIAGNOSIS — E1122 Type 2 diabetes mellitus with diabetic chronic kidney disease: Secondary | ICD-10-CM | POA: Diagnosis not present

## 2021-05-22 DIAGNOSIS — I129 Hypertensive chronic kidney disease with stage 1 through stage 4 chronic kidney disease, or unspecified chronic kidney disease: Secondary | ICD-10-CM | POA: Diagnosis not present

## 2021-05-22 DIAGNOSIS — R768 Other specified abnormal immunological findings in serum: Secondary | ICD-10-CM | POA: Diagnosis not present

## 2021-05-22 DIAGNOSIS — I10 Essential (primary) hypertension: Secondary | ICD-10-CM | POA: Diagnosis not present

## 2021-05-22 DIAGNOSIS — N189 Chronic kidney disease, unspecified: Secondary | ICD-10-CM | POA: Diagnosis not present

## 2021-05-22 DIAGNOSIS — E7849 Other hyperlipidemia: Secondary | ICD-10-CM | POA: Diagnosis not present

## 2021-05-22 DIAGNOSIS — E039 Hypothyroidism, unspecified: Secondary | ICD-10-CM | POA: Diagnosis not present

## 2021-05-22 DIAGNOSIS — E782 Mixed hyperlipidemia: Secondary | ICD-10-CM | POA: Diagnosis not present

## 2021-05-23 DIAGNOSIS — E1122 Type 2 diabetes mellitus with diabetic chronic kidney disease: Secondary | ICD-10-CM | POA: Diagnosis not present

## 2021-05-23 DIAGNOSIS — N189 Chronic kidney disease, unspecified: Secondary | ICD-10-CM | POA: Diagnosis not present

## 2021-05-23 DIAGNOSIS — I129 Hypertensive chronic kidney disease with stage 1 through stage 4 chronic kidney disease, or unspecified chronic kidney disease: Secondary | ICD-10-CM | POA: Diagnosis not present

## 2021-05-23 DIAGNOSIS — R768 Other specified abnormal immunological findings in serum: Secondary | ICD-10-CM | POA: Diagnosis not present

## 2021-05-26 DIAGNOSIS — J018 Other acute sinusitis: Secondary | ICD-10-CM | POA: Diagnosis not present

## 2021-05-26 DIAGNOSIS — E119 Type 2 diabetes mellitus without complications: Secondary | ICD-10-CM | POA: Diagnosis not present

## 2021-05-26 DIAGNOSIS — I10 Essential (primary) hypertension: Secondary | ICD-10-CM | POA: Diagnosis not present

## 2021-05-26 DIAGNOSIS — R030 Elevated blood-pressure reading, without diagnosis of hypertension: Secondary | ICD-10-CM | POA: Diagnosis not present

## 2021-05-26 DIAGNOSIS — J069 Acute upper respiratory infection, unspecified: Secondary | ICD-10-CM | POA: Diagnosis not present

## 2021-05-29 DIAGNOSIS — B0223 Postherpetic polyneuropathy: Secondary | ICD-10-CM | POA: Diagnosis not present

## 2021-05-29 DIAGNOSIS — E1122 Type 2 diabetes mellitus with diabetic chronic kidney disease: Secondary | ICD-10-CM | POA: Diagnosis not present

## 2021-05-29 DIAGNOSIS — E1165 Type 2 diabetes mellitus with hyperglycemia: Secondary | ICD-10-CM | POA: Diagnosis not present

## 2021-05-29 DIAGNOSIS — E876 Hypokalemia: Secondary | ICD-10-CM | POA: Diagnosis not present

## 2021-05-29 DIAGNOSIS — E039 Hypothyroidism, unspecified: Secondary | ICD-10-CM | POA: Diagnosis not present

## 2021-05-29 DIAGNOSIS — Z6841 Body Mass Index (BMI) 40.0 and over, adult: Secondary | ICD-10-CM | POA: Diagnosis not present

## 2021-05-29 DIAGNOSIS — D72829 Elevated white blood cell count, unspecified: Secondary | ICD-10-CM | POA: Diagnosis not present

## 2021-05-29 DIAGNOSIS — I10 Essential (primary) hypertension: Secondary | ICD-10-CM | POA: Diagnosis not present

## 2021-05-29 DIAGNOSIS — I358 Other nonrheumatic aortic valve disorders: Secondary | ICD-10-CM | POA: Diagnosis not present

## 2021-05-31 DIAGNOSIS — E1122 Type 2 diabetes mellitus with diabetic chronic kidney disease: Secondary | ICD-10-CM | POA: Diagnosis not present

## 2021-05-31 DIAGNOSIS — N189 Chronic kidney disease, unspecified: Secondary | ICD-10-CM | POA: Diagnosis not present

## 2021-05-31 DIAGNOSIS — N2581 Secondary hyperparathyroidism of renal origin: Secondary | ICD-10-CM | POA: Diagnosis not present

## 2021-05-31 DIAGNOSIS — I129 Hypertensive chronic kidney disease with stage 1 through stage 4 chronic kidney disease, or unspecified chronic kidney disease: Secondary | ICD-10-CM | POA: Diagnosis not present

## 2021-05-31 DIAGNOSIS — I110 Hypertensive heart disease with heart failure: Secondary | ICD-10-CM | POA: Diagnosis not present

## 2021-05-31 DIAGNOSIS — R768 Other specified abnormal immunological findings in serum: Secondary | ICD-10-CM | POA: Diagnosis not present

## 2021-05-31 DIAGNOSIS — I5030 Unspecified diastolic (congestive) heart failure: Secondary | ICD-10-CM | POA: Diagnosis not present

## 2021-05-31 DIAGNOSIS — E871 Hypo-osmolality and hyponatremia: Secondary | ICD-10-CM | POA: Diagnosis not present

## 2021-06-04 DIAGNOSIS — E876 Hypokalemia: Secondary | ICD-10-CM | POA: Diagnosis not present

## 2021-06-04 DIAGNOSIS — E1122 Type 2 diabetes mellitus with diabetic chronic kidney disease: Secondary | ICD-10-CM | POA: Diagnosis not present

## 2021-06-04 DIAGNOSIS — N184 Chronic kidney disease, stage 4 (severe): Secondary | ICD-10-CM | POA: Diagnosis not present

## 2021-07-13 DIAGNOSIS — I129 Hypertensive chronic kidney disease with stage 1 through stage 4 chronic kidney disease, or unspecified chronic kidney disease: Secondary | ICD-10-CM | POA: Diagnosis not present

## 2021-07-13 DIAGNOSIS — E7849 Other hyperlipidemia: Secondary | ICD-10-CM | POA: Diagnosis not present

## 2021-07-13 DIAGNOSIS — E1122 Type 2 diabetes mellitus with diabetic chronic kidney disease: Secondary | ICD-10-CM | POA: Diagnosis not present

## 2021-07-13 DIAGNOSIS — N184 Chronic kidney disease, stage 4 (severe): Secondary | ICD-10-CM | POA: Diagnosis not present

## 2021-07-23 IMAGING — US US EXTREM LOW VENOUS*R*
1 series · 14 of 24 positions shown · non-contrast
Comparison: None.

CLINICAL DATA: Calf pain x2 days, history of tobacco abuse

EXAM:
RIGHT LOWER EXTREMITY VENOUS DOPPLER ULTRASOUND
TECHNIQUE: Gray-scale sonography with compression, as well as color and duplex
ultrasound, were performed to evaluate the deep venous system(s)
from the level of the common femoral vein through the popliteal and
proximal calf veins.

[Series 1: us venous img lower uni right (dvt) · portal-venous · 14 of 35 slices shown]
[im 1/35]
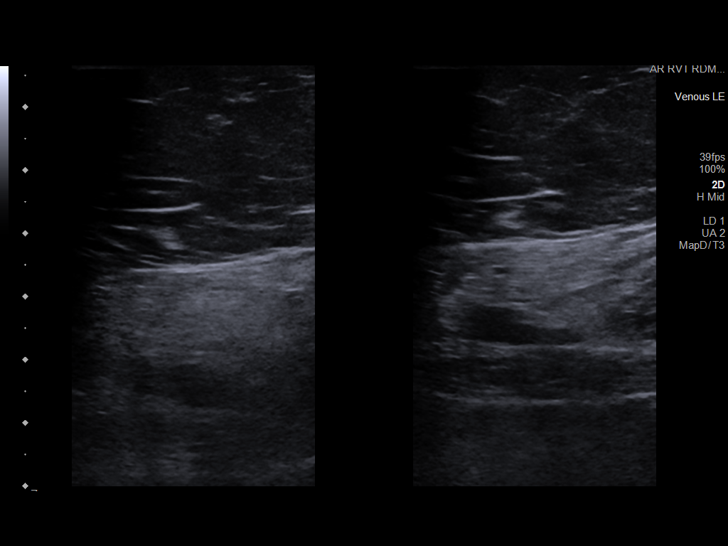
[im 3/35]
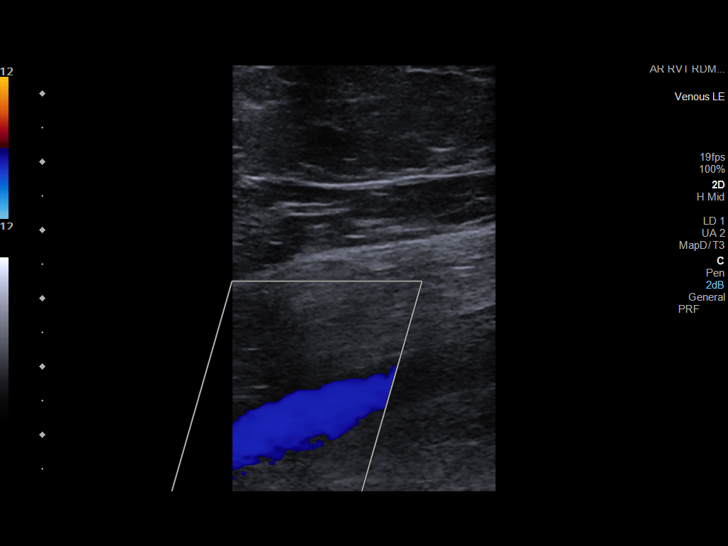
[im 6/35]
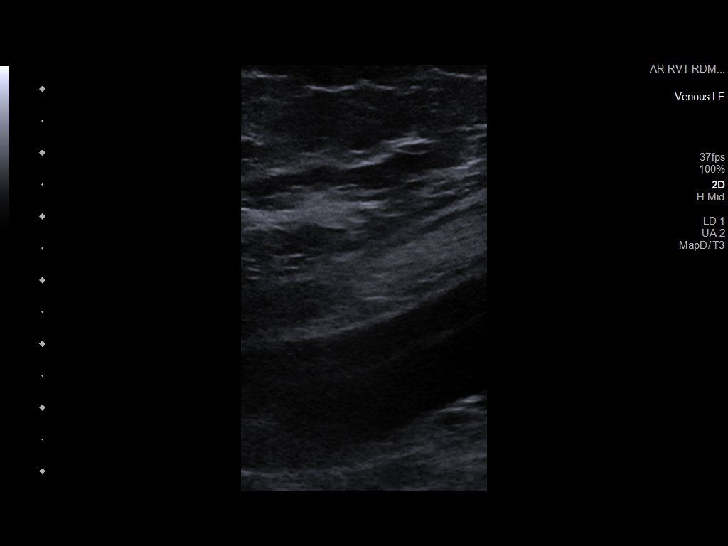
[im 9/35]
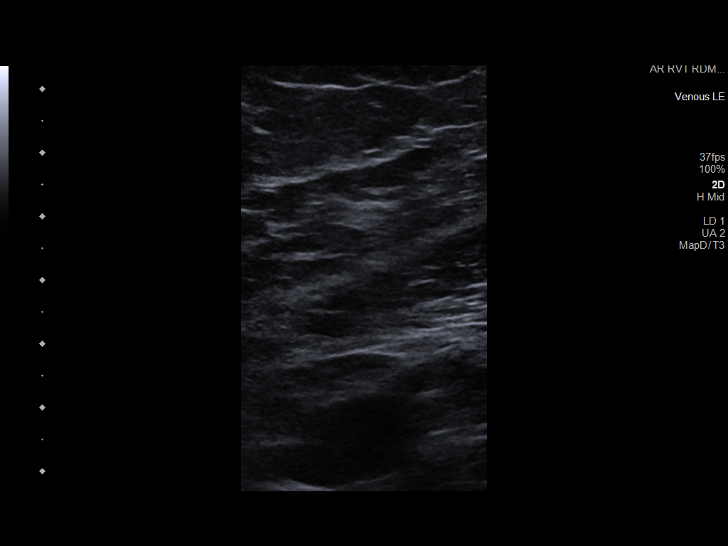
[im 11/35]
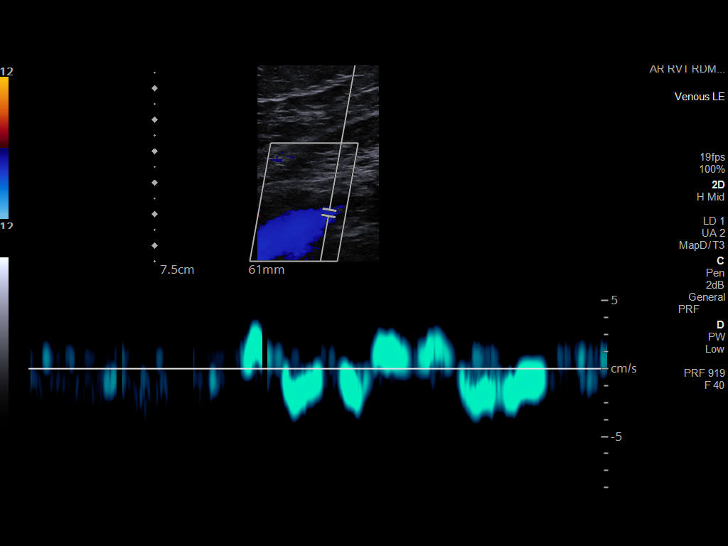
[im 14/35]
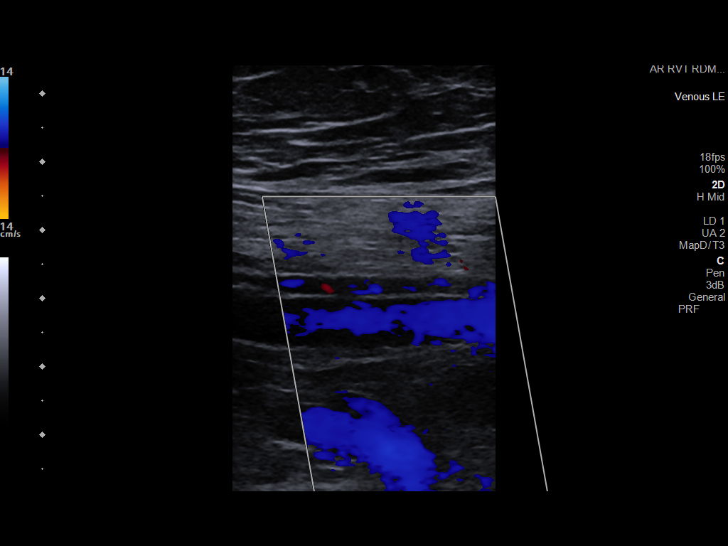
[im 17/35]
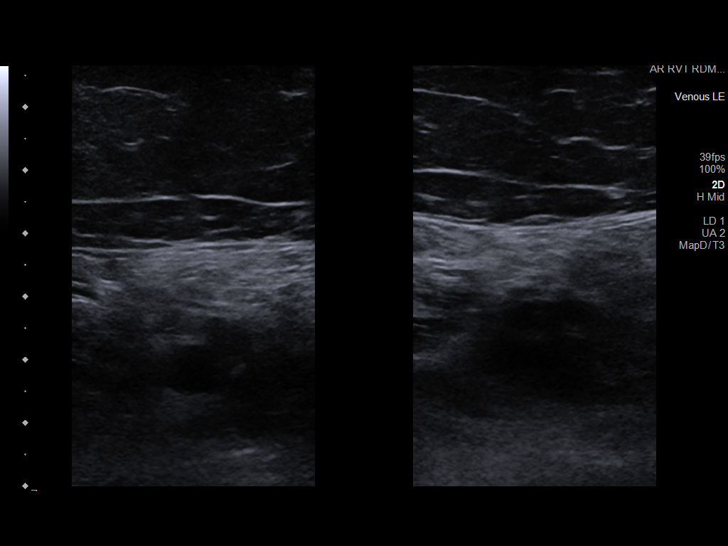
[im 18/35]
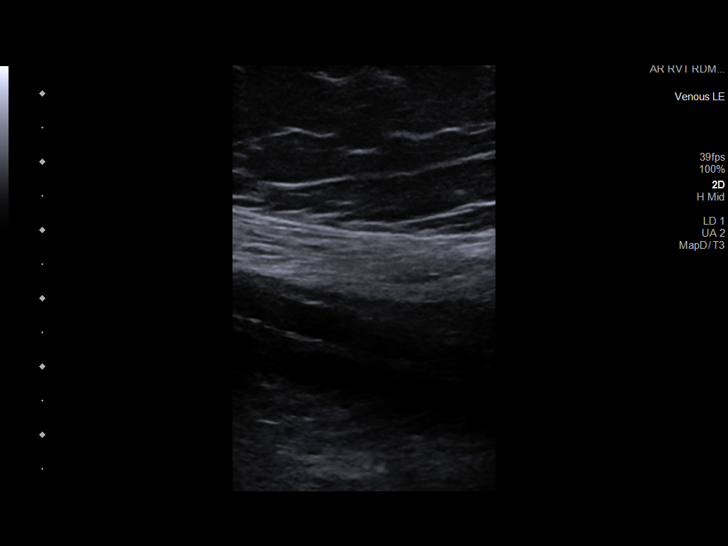
[im 21/35]
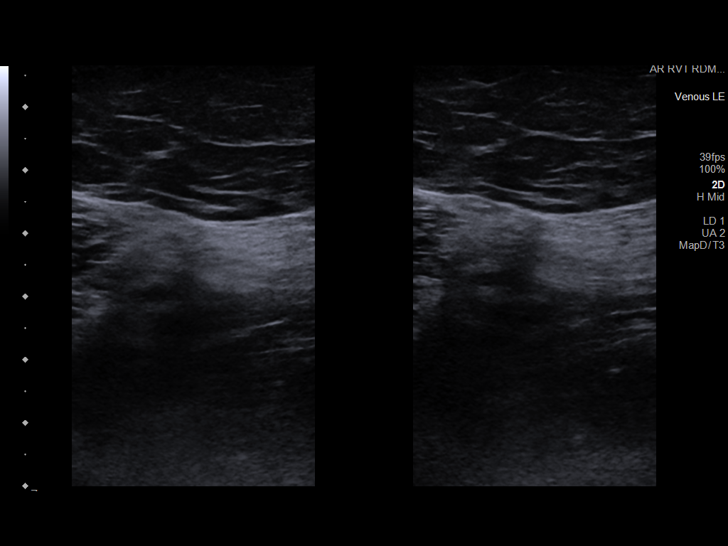
[im 24/35]
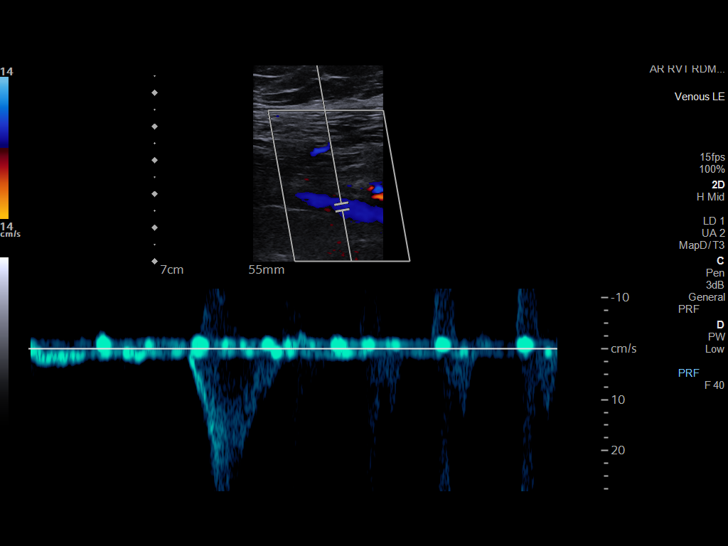
[im 27/35]
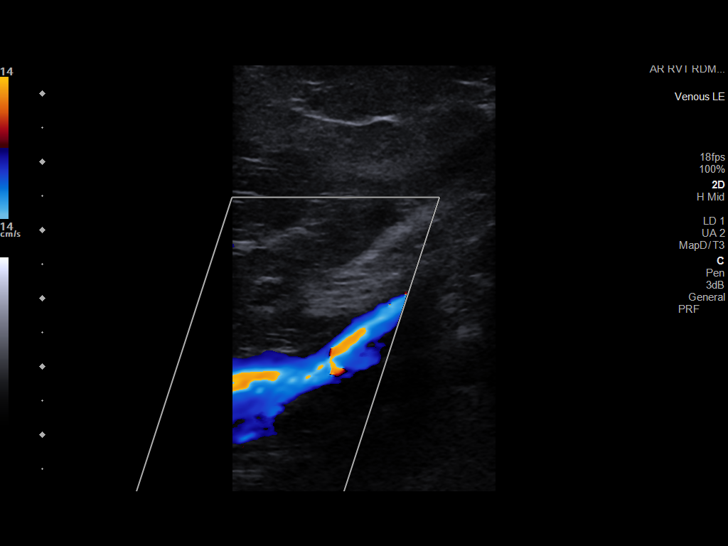
[im 29/35]
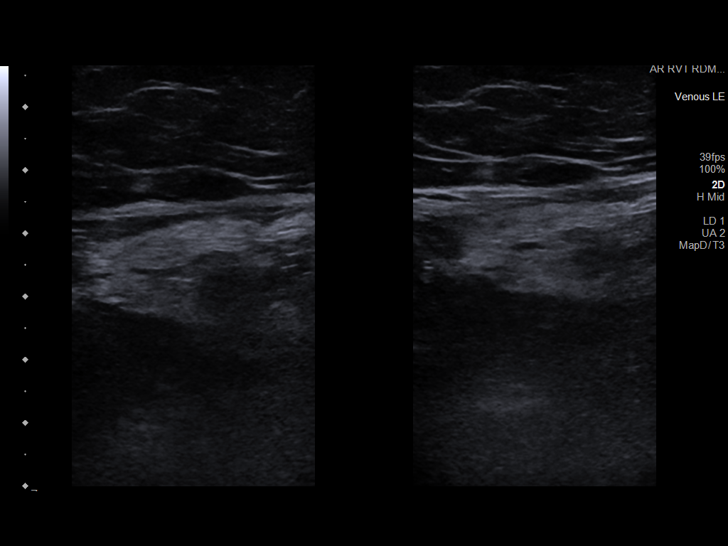
[im 32/35]
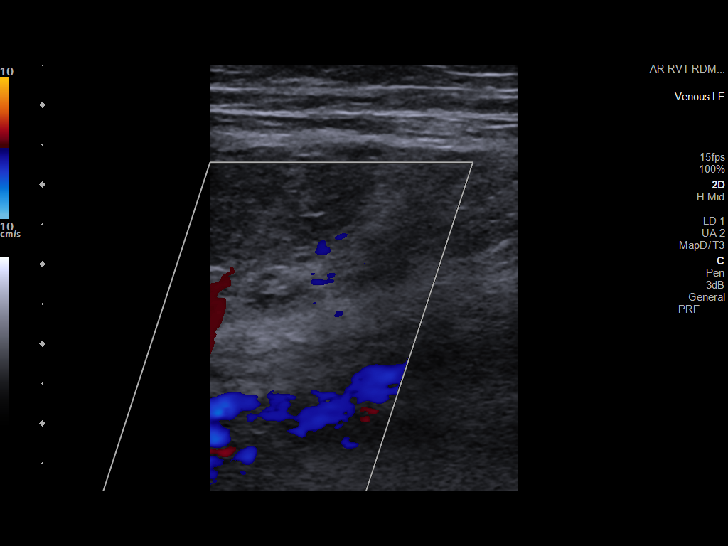
[im 35/35]
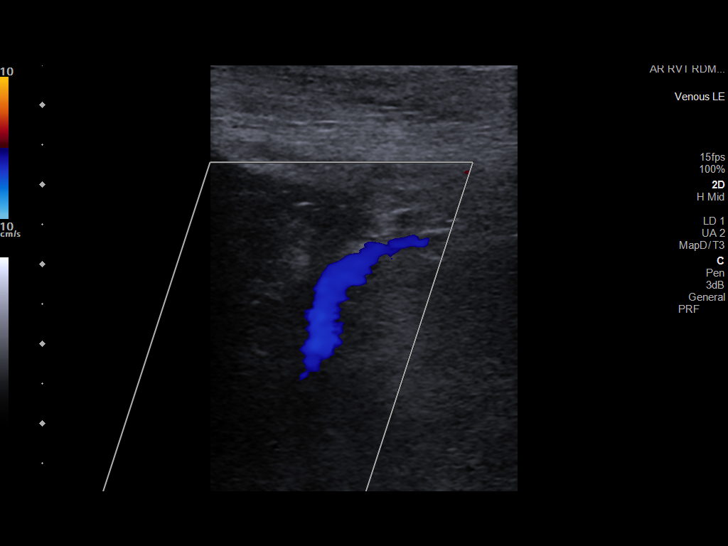

[14 of 24 positions shown; findings below may reference images not displayed]

FINDINGS: VENOUS

Normal compressibility of the common femoral, superficial femoral,
and popliteal veins, as well as the visualized calf veins.
Visualized portions of profunda femoral vein and great saphenous
vein unremarkable. No filling defects to suggest DVT on grayscale or
color Doppler imaging. Doppler waveforms show normal direction of
venous flow, normal respiratory phasicity and response to
augmentation.

Limited views of the contralateral common femoral vein are
unremarkable.

OTHER

None.

Limitations: none
IMPRESSION: No femoropopliteal DVT nor evidence of DVT within the visualized
calf veins.

If clinical symptoms are inconsistent or if there are persistent or
worsening symptoms, further imaging (possibly involving the iliac
veins) may be warranted.

## 2021-07-30 DIAGNOSIS — I129 Hypertensive chronic kidney disease with stage 1 through stage 4 chronic kidney disease, or unspecified chronic kidney disease: Secondary | ICD-10-CM | POA: Diagnosis not present

## 2021-07-30 DIAGNOSIS — E1122 Type 2 diabetes mellitus with diabetic chronic kidney disease: Secondary | ICD-10-CM | POA: Diagnosis not present

## 2021-07-30 DIAGNOSIS — N189 Chronic kidney disease, unspecified: Secondary | ICD-10-CM | POA: Diagnosis not present

## 2021-07-30 DIAGNOSIS — E871 Hypo-osmolality and hyponatremia: Secondary | ICD-10-CM | POA: Diagnosis not present

## 2021-08-08 DIAGNOSIS — N2581 Secondary hyperparathyroidism of renal origin: Secondary | ICD-10-CM | POA: Diagnosis not present

## 2021-08-08 DIAGNOSIS — E1122 Type 2 diabetes mellitus with diabetic chronic kidney disease: Secondary | ICD-10-CM | POA: Diagnosis not present

## 2021-08-08 DIAGNOSIS — I5030 Unspecified diastolic (congestive) heart failure: Secondary | ICD-10-CM | POA: Diagnosis not present

## 2021-08-08 DIAGNOSIS — E876 Hypokalemia: Secondary | ICD-10-CM | POA: Diagnosis not present

## 2021-08-08 DIAGNOSIS — I129 Hypertensive chronic kidney disease with stage 1 through stage 4 chronic kidney disease, or unspecified chronic kidney disease: Secondary | ICD-10-CM | POA: Diagnosis not present

## 2021-08-08 DIAGNOSIS — E211 Secondary hyperparathyroidism, not elsewhere classified: Secondary | ICD-10-CM | POA: Diagnosis not present

## 2021-08-08 DIAGNOSIS — I110 Hypertensive heart disease with heart failure: Secondary | ICD-10-CM | POA: Diagnosis not present

## 2021-08-08 DIAGNOSIS — N189 Chronic kidney disease, unspecified: Secondary | ICD-10-CM | POA: Diagnosis not present

## 2021-08-13 DIAGNOSIS — E876 Hypokalemia: Secondary | ICD-10-CM | POA: Diagnosis not present

## 2021-08-13 DIAGNOSIS — E211 Secondary hyperparathyroidism, not elsewhere classified: Secondary | ICD-10-CM | POA: Diagnosis not present

## 2021-08-13 DIAGNOSIS — N189 Chronic kidney disease, unspecified: Secondary | ICD-10-CM | POA: Diagnosis not present

## 2021-08-13 DIAGNOSIS — E1122 Type 2 diabetes mellitus with diabetic chronic kidney disease: Secondary | ICD-10-CM | POA: Diagnosis not present

## 2021-08-13 DIAGNOSIS — I129 Hypertensive chronic kidney disease with stage 1 through stage 4 chronic kidney disease, or unspecified chronic kidney disease: Secondary | ICD-10-CM | POA: Diagnosis not present

## 2021-08-27 DIAGNOSIS — M1811 Unilateral primary osteoarthritis of first carpometacarpal joint, right hand: Secondary | ICD-10-CM | POA: Diagnosis not present

## 2021-08-27 DIAGNOSIS — E876 Hypokalemia: Secondary | ICD-10-CM | POA: Diagnosis not present

## 2021-08-27 DIAGNOSIS — I129 Hypertensive chronic kidney disease with stage 1 through stage 4 chronic kidney disease, or unspecified chronic kidney disease: Secondary | ICD-10-CM | POA: Diagnosis not present

## 2021-08-27 DIAGNOSIS — E7849 Other hyperlipidemia: Secondary | ICD-10-CM | POA: Diagnosis not present

## 2021-08-27 DIAGNOSIS — M778 Other enthesopathies, not elsewhere classified: Secondary | ICD-10-CM | POA: Diagnosis not present

## 2021-08-27 DIAGNOSIS — E1122 Type 2 diabetes mellitus with diabetic chronic kidney disease: Secondary | ICD-10-CM | POA: Diagnosis not present

## 2021-08-27 DIAGNOSIS — N189 Chronic kidney disease, unspecified: Secondary | ICD-10-CM | POA: Diagnosis not present

## 2021-08-27 DIAGNOSIS — E1165 Type 2 diabetes mellitus with hyperglycemia: Secondary | ICD-10-CM | POA: Diagnosis not present

## 2021-08-27 DIAGNOSIS — Z79890 Hormone replacement therapy (postmenopausal): Secondary | ICD-10-CM | POA: Diagnosis not present

## 2021-08-27 DIAGNOSIS — E039 Hypothyroidism, unspecified: Secondary | ICD-10-CM | POA: Diagnosis not present

## 2021-08-27 DIAGNOSIS — Z87891 Personal history of nicotine dependence: Secondary | ICD-10-CM | POA: Diagnosis not present

## 2021-08-27 DIAGNOSIS — Z7984 Long term (current) use of oral hypoglycemic drugs: Secondary | ICD-10-CM | POA: Diagnosis not present

## 2021-08-27 DIAGNOSIS — Z7982 Long term (current) use of aspirin: Secondary | ICD-10-CM | POA: Diagnosis not present

## 2021-08-27 DIAGNOSIS — Z79899 Other long term (current) drug therapy: Secondary | ICD-10-CM | POA: Diagnosis not present

## 2021-08-27 DIAGNOSIS — E782 Mixed hyperlipidemia: Secondary | ICD-10-CM | POA: Diagnosis not present

## 2021-08-30 DIAGNOSIS — M109 Gout, unspecified: Secondary | ICD-10-CM | POA: Diagnosis not present

## 2021-08-30 DIAGNOSIS — D72829 Elevated white blood cell count, unspecified: Secondary | ICD-10-CM | POA: Diagnosis not present

## 2021-08-30 DIAGNOSIS — E876 Hypokalemia: Secondary | ICD-10-CM | POA: Diagnosis not present

## 2021-08-30 DIAGNOSIS — B0223 Postherpetic polyneuropathy: Secondary | ICD-10-CM | POA: Diagnosis not present

## 2021-08-30 DIAGNOSIS — E039 Hypothyroidism, unspecified: Secondary | ICD-10-CM | POA: Diagnosis not present

## 2021-08-30 DIAGNOSIS — E1122 Type 2 diabetes mellitus with diabetic chronic kidney disease: Secondary | ICD-10-CM | POA: Diagnosis not present

## 2021-08-30 DIAGNOSIS — I10 Essential (primary) hypertension: Secondary | ICD-10-CM | POA: Diagnosis not present

## 2021-08-31 DIAGNOSIS — E1122 Type 2 diabetes mellitus with diabetic chronic kidney disease: Secondary | ICD-10-CM | POA: Diagnosis not present

## 2021-08-31 DIAGNOSIS — E039 Hypothyroidism, unspecified: Secondary | ICD-10-CM | POA: Diagnosis not present

## 2021-08-31 DIAGNOSIS — I129 Hypertensive chronic kidney disease with stage 1 through stage 4 chronic kidney disease, or unspecified chronic kidney disease: Secondary | ICD-10-CM | POA: Diagnosis not present

## 2021-08-31 DIAGNOSIS — K5900 Constipation, unspecified: Secondary | ICD-10-CM | POA: Diagnosis not present

## 2021-08-31 DIAGNOSIS — N189 Chronic kidney disease, unspecified: Secondary | ICD-10-CM | POA: Diagnosis not present

## 2021-08-31 DIAGNOSIS — E785 Hyperlipidemia, unspecified: Secondary | ICD-10-CM | POA: Diagnosis not present

## 2021-08-31 DIAGNOSIS — K5901 Slow transit constipation: Secondary | ICD-10-CM | POA: Diagnosis not present

## 2021-09-19 DIAGNOSIS — E0590 Thyrotoxicosis, unspecified without thyrotoxic crisis or storm: Secondary | ICD-10-CM | POA: Insufficient documentation

## 2021-09-19 DIAGNOSIS — E785 Hyperlipidemia, unspecified: Secondary | ICD-10-CM | POA: Insufficient documentation

## 2021-09-19 DIAGNOSIS — R0981 Nasal congestion: Secondary | ICD-10-CM | POA: Diagnosis not present

## 2021-09-19 DIAGNOSIS — H6692 Otitis media, unspecified, left ear: Secondary | ICD-10-CM | POA: Diagnosis not present

## 2021-09-19 DIAGNOSIS — N289 Disorder of kidney and ureter, unspecified: Secondary | ICD-10-CM | POA: Insufficient documentation

## 2021-09-24 DIAGNOSIS — M170 Bilateral primary osteoarthritis of knee: Secondary | ICD-10-CM | POA: Diagnosis not present

## 2021-10-04 DIAGNOSIS — M170 Bilateral primary osteoarthritis of knee: Secondary | ICD-10-CM | POA: Diagnosis not present

## 2021-10-09 DIAGNOSIS — I129 Hypertensive chronic kidney disease with stage 1 through stage 4 chronic kidney disease, or unspecified chronic kidney disease: Secondary | ICD-10-CM | POA: Diagnosis not present

## 2021-10-09 DIAGNOSIS — E211 Secondary hyperparathyroidism, not elsewhere classified: Secondary | ICD-10-CM | POA: Diagnosis not present

## 2021-10-09 DIAGNOSIS — E1122 Type 2 diabetes mellitus with diabetic chronic kidney disease: Secondary | ICD-10-CM | POA: Diagnosis not present

## 2021-10-09 DIAGNOSIS — N189 Chronic kidney disease, unspecified: Secondary | ICD-10-CM | POA: Diagnosis not present

## 2021-10-09 DIAGNOSIS — E876 Hypokalemia: Secondary | ICD-10-CM | POA: Diagnosis not present

## 2021-10-12 DIAGNOSIS — I129 Hypertensive chronic kidney disease with stage 1 through stage 4 chronic kidney disease, or unspecified chronic kidney disease: Secondary | ICD-10-CM | POA: Diagnosis not present

## 2021-10-12 DIAGNOSIS — E7849 Other hyperlipidemia: Secondary | ICD-10-CM | POA: Diagnosis not present

## 2021-10-12 DIAGNOSIS — E1122 Type 2 diabetes mellitus with diabetic chronic kidney disease: Secondary | ICD-10-CM | POA: Diagnosis not present

## 2021-10-17 DIAGNOSIS — I129 Hypertensive chronic kidney disease with stage 1 through stage 4 chronic kidney disease, or unspecified chronic kidney disease: Secondary | ICD-10-CM | POA: Diagnosis not present

## 2021-10-17 DIAGNOSIS — N189 Chronic kidney disease, unspecified: Secondary | ICD-10-CM | POA: Diagnosis not present

## 2021-10-17 DIAGNOSIS — E876 Hypokalemia: Secondary | ICD-10-CM | POA: Diagnosis not present

## 2021-10-17 DIAGNOSIS — E1122 Type 2 diabetes mellitus with diabetic chronic kidney disease: Secondary | ICD-10-CM | POA: Diagnosis not present

## 2021-10-17 DIAGNOSIS — E211 Secondary hyperparathyroidism, not elsewhere classified: Secondary | ICD-10-CM | POA: Diagnosis not present

## 2021-10-25 DIAGNOSIS — B0223 Postherpetic polyneuropathy: Secondary | ICD-10-CM | POA: Diagnosis not present

## 2021-10-25 DIAGNOSIS — E1122 Type 2 diabetes mellitus with diabetic chronic kidney disease: Secondary | ICD-10-CM | POA: Diagnosis not present

## 2021-10-25 DIAGNOSIS — M1711 Unilateral primary osteoarthritis, right knee: Secondary | ICD-10-CM | POA: Diagnosis not present

## 2021-10-25 DIAGNOSIS — M1712 Unilateral primary osteoarthritis, left knee: Secondary | ICD-10-CM | POA: Diagnosis not present

## 2021-10-25 DIAGNOSIS — E039 Hypothyroidism, unspecified: Secondary | ICD-10-CM | POA: Diagnosis not present

## 2021-10-25 DIAGNOSIS — I10 Essential (primary) hypertension: Secondary | ICD-10-CM | POA: Diagnosis not present

## 2021-10-25 DIAGNOSIS — Z6841 Body Mass Index (BMI) 40.0 and over, adult: Secondary | ICD-10-CM | POA: Diagnosis not present

## 2021-10-25 DIAGNOSIS — N184 Chronic kidney disease, stage 4 (severe): Secondary | ICD-10-CM | POA: Diagnosis not present

## 2021-11-11 DIAGNOSIS — I129 Hypertensive chronic kidney disease with stage 1 through stage 4 chronic kidney disease, or unspecified chronic kidney disease: Secondary | ICD-10-CM | POA: Diagnosis not present

## 2021-11-11 DIAGNOSIS — E7849 Other hyperlipidemia: Secondary | ICD-10-CM | POA: Diagnosis not present

## 2021-11-11 DIAGNOSIS — N184 Chronic kidney disease, stage 4 (severe): Secondary | ICD-10-CM | POA: Diagnosis not present

## 2021-11-11 DIAGNOSIS — E1122 Type 2 diabetes mellitus with diabetic chronic kidney disease: Secondary | ICD-10-CM | POA: Diagnosis not present

## 2021-11-26 DIAGNOSIS — D649 Anemia, unspecified: Secondary | ICD-10-CM | POA: Diagnosis not present

## 2021-11-26 DIAGNOSIS — E039 Hypothyroidism, unspecified: Secondary | ICD-10-CM | POA: Diagnosis not present

## 2021-11-26 DIAGNOSIS — N189 Chronic kidney disease, unspecified: Secondary | ICD-10-CM | POA: Diagnosis not present

## 2021-11-26 DIAGNOSIS — E1165 Type 2 diabetes mellitus with hyperglycemia: Secondary | ICD-10-CM | POA: Diagnosis not present

## 2021-11-26 DIAGNOSIS — E1122 Type 2 diabetes mellitus with diabetic chronic kidney disease: Secondary | ICD-10-CM | POA: Diagnosis not present

## 2021-11-26 DIAGNOSIS — E7849 Other hyperlipidemia: Secondary | ICD-10-CM | POA: Diagnosis not present

## 2021-11-26 DIAGNOSIS — E782 Mixed hyperlipidemia: Secondary | ICD-10-CM | POA: Diagnosis not present

## 2021-11-28 DIAGNOSIS — R5383 Other fatigue: Secondary | ICD-10-CM | POA: Diagnosis not present

## 2021-11-29 ENCOUNTER — Emergency Department (HOSPITAL_COMMUNITY): Payer: Medicare HMO

## 2021-11-29 ENCOUNTER — Encounter (HOSPITAL_COMMUNITY): Payer: Self-pay

## 2021-11-29 ENCOUNTER — Other Ambulatory Visit: Payer: Self-pay

## 2021-11-29 ENCOUNTER — Emergency Department (HOSPITAL_COMMUNITY)
Admission: EM | Admit: 2021-11-29 | Discharge: 2021-11-29 | Disposition: A | Discharge status: Home / Self Care | Payer: Medicare HMO | Attending: Student | Admitting: Student

## 2021-11-29 DIAGNOSIS — R0989 Other specified symptoms and signs involving the circulatory and respiratory systems: Secondary | ICD-10-CM

## 2021-11-29 DIAGNOSIS — R1310 Dysphagia, unspecified: Secondary | ICD-10-CM | POA: Insufficient documentation

## 2021-11-29 DIAGNOSIS — M542 Cervicalgia: Secondary | ICD-10-CM | POA: Diagnosis not present

## 2021-11-29 DIAGNOSIS — J3800 Paralysis of vocal cords and larynx, unspecified: Secondary | ICD-10-CM | POA: Diagnosis not present

## 2021-11-29 DIAGNOSIS — Z87891 Personal history of nicotine dependence: Secondary | ICD-10-CM | POA: Insufficient documentation

## 2021-11-29 DIAGNOSIS — E1122 Type 2 diabetes mellitus with diabetic chronic kidney disease: Secondary | ICD-10-CM | POA: Diagnosis not present

## 2021-11-29 DIAGNOSIS — B0223 Postherpetic polyneuropathy: Secondary | ICD-10-CM | POA: Diagnosis not present

## 2021-11-29 DIAGNOSIS — M47812 Spondylosis without myelopathy or radiculopathy, cervical region: Secondary | ICD-10-CM | POA: Diagnosis not present

## 2021-11-29 DIAGNOSIS — J392 Other diseases of pharynx: Secondary | ICD-10-CM | POA: Diagnosis not present

## 2021-11-29 DIAGNOSIS — Z79899 Other long term (current) drug therapy: Secondary | ICD-10-CM | POA: Diagnosis not present

## 2021-11-29 DIAGNOSIS — E042 Nontoxic multinodular goiter: Secondary | ICD-10-CM | POA: Diagnosis not present

## 2021-11-29 DIAGNOSIS — I6523 Occlusion and stenosis of bilateral carotid arteries: Secondary | ICD-10-CM | POA: Diagnosis not present

## 2021-11-29 DIAGNOSIS — E876 Hypokalemia: Secondary | ICD-10-CM | POA: Insufficient documentation

## 2021-11-29 DIAGNOSIS — Z7982 Long term (current) use of aspirin: Secondary | ICD-10-CM | POA: Diagnosis not present

## 2021-11-29 DIAGNOSIS — E039 Hypothyroidism, unspecified: Secondary | ICD-10-CM | POA: Insufficient documentation

## 2021-11-29 DIAGNOSIS — E119 Type 2 diabetes mellitus without complications: Secondary | ICD-10-CM | POA: Diagnosis not present

## 2021-11-29 DIAGNOSIS — R531 Weakness: Secondary | ICD-10-CM | POA: Diagnosis not present

## 2021-11-29 DIAGNOSIS — Z7984 Long term (current) use of oral hypoglycemic drugs: Secondary | ICD-10-CM | POA: Diagnosis not present

## 2021-11-29 DIAGNOSIS — Z6841 Body Mass Index (BMI) 40.0 and over, adult: Secondary | ICD-10-CM | POA: Diagnosis not present

## 2021-11-29 DIAGNOSIS — R5383 Other fatigue: Secondary | ICD-10-CM | POA: Insufficient documentation

## 2021-11-29 DIAGNOSIS — I10 Essential (primary) hypertension: Secondary | ICD-10-CM | POA: Insufficient documentation

## 2021-11-29 LAB — CBC
HCT: 42.1 % (ref 36.0–46.0)
Hemoglobin: 13.6 g/dL (ref 12.0–15.0)
MCH: 29.8 pg (ref 26.0–34.0)
MCHC: 32.3 g/dL (ref 30.0–36.0)
MCV: 92.3 fL (ref 80.0–100.0)
Platelets: 221 10*3/uL (ref 150–400)
RBC: 4.56 MIL/uL (ref 3.87–5.11)
RDW: 13.5 % (ref 11.5–15.5)
WBC: 8.6 10*3/uL (ref 4.0–10.5)
nRBC: 0 % (ref 0.0–0.2)

## 2021-11-29 LAB — BASIC METABOLIC PANEL
Anion gap: 10 (ref 5–15)
BUN: 17 mg/dL (ref 8–23)
CO2: 27 mmol/L (ref 22–32)
Calcium: 10.8 mg/dL — ABNORMAL HIGH (ref 8.9–10.3)
Chloride: 95 mmol/L — ABNORMAL LOW (ref 98–111)
Creatinine, Ser: 1.98 mg/dL — ABNORMAL HIGH (ref 0.44–1.00)
GFR, Estimated: 26 mL/min — ABNORMAL LOW (ref >60)
Glucose, Bld: 146 mg/dL — ABNORMAL HIGH (ref 70–99)
Potassium: 3.4 mmol/L — ABNORMAL LOW (ref 3.5–5.1)
Sodium: 132 mmol/L — ABNORMAL LOW (ref 135–145)

## 2021-11-29 LAB — CBG MONITORING, ED: Glucose-Capillary: 122 mg/dL — ABNORMAL HIGH (ref 70–99)

## 2021-11-29 LAB — TSH: TSH: 3.137 u[IU]/mL (ref 0.350–4.500)

## 2021-11-29 MED ORDER — ALUM & MAG HYDROXIDE-SIMETH 200-200-20 MG/5ML PO SUSP
30.0000 mL | Freq: Once | ORAL | Status: AC
  Administered 2021-11-29: 30 mL via ORAL
  Filled 2021-11-29: qty 30

## 2021-11-29 MED ORDER — OMEPRAZOLE 20 MG PO CPDR: 20.0000 mg | DELAYED_RELEASE_CAPSULE | Freq: Every day | ORAL | 0 refills | Status: DC

## 2021-11-29 MED ORDER — LIDOCAINE VISCOUS HCL 2 % MT SOLN
15.0000 mL | Freq: Once | OROMUCOSAL | Status: AC
  Administered 2021-11-29: 15 mL via ORAL
  Filled 2021-11-29: qty 15

## 2021-11-29 MED ORDER — LACTATED RINGERS IV BOLUS
1000.0000 mL | Freq: Once | INTRAVENOUS | Status: AC
  Administered 2021-11-29: 1000 mL via INTRAVENOUS

## 2021-11-29 NOTE — ED Provider Notes (Signed)
[facility] EMERGENCY DEPARTMENT Provider Note  CSN: [id number] Arrival date & time: 11/29/21 1026  Chief Complaint(s) Fatigue and lump in throat  HPI ALLIE OUSLEY is a 76 y.o. female with PMH T2DM, HTN, hypothyroidism and goiter who presents emergency department for evaluation of fatigue and dysphagia.  States that her symptoms have been getting worse over the last 4 days and that she feels like she has a "lump in her throat that is moving around".  The patient apparently saw her primary care physician today who sent her to the emergency department for further evaluation of her fatigue.  She denies chest pain, shortness of breath, headache, vomiting, diarrhea, constipation or other systemic symptoms.   Past Medical History Past Medical History:  Diagnosis Date   Diabetes mellitus without complication ([place])    Hypertension    Hypothyroidism    Patient Active Problem List   Diagnosis Date Noted   Spondylolisthesis at L4-L5 level 01/20/2018   ARTHRITIS, RIGHT KNEE 11/01/2009   CARPAL TUNNEL SYNDROME 11/09/2007   DEGENERATIVE JOINT DISEASE, LEFT KNEE 06/29/2007   KNEE PAIN 06/29/2007   DIABETES 06/26/2007   HIGH BLOOD PRESSURE 06/26/2007   Home Medication(s) Prior to Admission medications   Medication Sig Start Date End Date Taking? Authorizing Provider  amLODipine (NORVASC) 5 MG tablet Take 5 mg by mouth daily. 09/05/15   [provider]  aspirin EC 81 MG tablet Take 1 tablet (81 mg total) by mouth daily at 6 PM. 1700 01/27/18   Costella, Vista Mink, PA-C  Camphor-Eucalyptus-Menthol (VICKS VAPORUB EX) Place 1 application into the nose 3 (three) times daily as needed (for nasal congestion.).    [provider]  CARTIA XT 180 MG 24 hr capsule Take 180 mg by mouth daily. 08/19/15   [provider]  chlorthalidone (HYGROTON) 25 MG tablet Take 25 mg by mouth daily. 09/16/15   [provider]  Cholecalciferol (VITAMIN D3) 2000 units TABS Take 2,000 Units  by mouth daily.    [provider]  furosemide (LASIX) 40 MG tablet Take 40 mg by mouth daily as needed (for fluid retention.).    [provider]  glipiZIDE (GLUCOTROL) 10 MG tablet Take 10 mg by mouth 2 (two) times daily. 09/16/15   [provider]  hydrOXYzine (ATARAX/VISTARIL) 25 MG tablet Take 1-2 tablets (25-50 mg total) by mouth every 4 (four) hours as needed for itching. 01/21/18   Costella, Vista Mink, PA-C  ipratropium (ATROVENT) 0.03 % nasal spray Place 1-2 sprays into both nostrils 2 (two) times daily as needed (for allergies.).  10/11/15   [provider]  levothyroxine (SYNTHROID, LEVOTHROID) 125 MCG tablet Take 125 mcg by mouth daily before breakfast.  09/16/15   [provider]  metFORMIN (GLUCOPHAGE) 1000 MG tablet Take 1,000 mg by mouth 2 (two) times daily. 08/21/15   [provider]  methocarbamol (ROBAXIN) 500 MG tablet Take 1 tablet (500 mg total) by mouth every 6 (six) hours as needed for muscle spasms. 01/21/18   Costella, Vista Mink, PA-C  oxyCODONE-acetaminophen (PERCOCET) 7.5-325 MG tablet Take 1 tablet by mouth every 4 (four) hours as needed for severe pain. 01/21/18   Costella, Vista Mink, PA-C  pravastatin (PRAVACHOL) 40 MG tablet Take 40 mg by mouth at bedtime.  09/16/15   [provider]  Past Surgical History Past Surgical History:  Procedure Laterality Date   ABDOMINAL HYSTERECTOMY     APPENDECTOMY     BREAST SURGERY     CYST REMOVAL NECK N/A 2018   HAND SURGERY     Family History No family history on file.  Social History Social History   Tobacco Use   Smoking status: Former   Smokeless tobacco: Never  Scientific laboratory technician Use: Never used  Substance Use Topics   Alcohol use: No   Drug use: No   Allergies Patient has no known allergies.  Review of Systems Review of  Systems  Constitutional:  Positive for fatigue.  HENT:  Positive for sore throat.    Physical Exam Vital Signs  I have reviewed the triage vital signs BP 126/63   Pulse 76   Temp 97.8 F (36.6 C) (Oral)   Resp 16   Ht 5\' 5"  (1.651 m)   Wt 115.2 kg   SpO2 100%   BMI 42.27 kg/m   Physical Exam Vitals and nursing note reviewed.  Constitutional:      General: She is not in acute distress.    Appearance: She is well-developed.  HENT:     Head: Normocephalic and atraumatic.  Eyes:     Conjunctiva/sclera: Conjunctivae normal.  Neck:     Comments: Palpable thyroid Cardiovascular:     Rate and Rhythm: Normal rate and regular rhythm.     Heart sounds: No murmur heard. Pulmonary:     Effort: Pulmonary effort is normal. No respiratory distress.     Breath sounds: Normal breath sounds.  Abdominal:     Palpations: Abdomen is soft.     Tenderness: There is no abdominal tenderness.  Musculoskeletal:        General: No swelling.     Cervical back: Neck supple. Tenderness present.  Skin:    General: Skin is warm and dry.     Capillary Refill: Capillary refill takes less than 2 seconds.  Neurological:     Mental Status: She is alert.  Psychiatric:        Mood and Affect: Mood normal.    ED Results and Treatments Labs (all labs ordered are listed, but only abnormal results are displayed) Labs Reviewed  BASIC METABOLIC PANEL - Abnormal; Notable for the following components:      Result Value   Sodium 132 (*)    Potassium 3.4 (*)    Chloride 95 (*)    Glucose, Bld 146 (*)    Creatinine, Ser 1.98 (*)    Calcium 10.8 (*)    GFR, Estimated 26 (*)    All other components within normal limits  CBC  TSH  URINALYSIS, ROUTINE W REFLEX MICROSCOPIC                                                                                                                          Radiology US THYROID  Result Date: 11/29/2021 CLINICAL DATA:  Other.  Globus sensation x1 day EXAM: THYROID  ULTRASOUND TECHNIQUE: Ultrasound examination of the thyroid gland and adjacent soft tissues was performed. COMPARISON:  Remote prior thyroid ultrasound 09/25/2009 FINDINGS: Parenchymal Echotexture: Markedly heterogenous Isthmus: 0.8 cm Right lobe: 4.6 x 2.5 x 2.8 cm Left lobe: 4.2 x 2.9 x 2.3 cm _________________________________________________________ Estimated total number of nodules >/= 1 cm: 0 Number of spongiform nodules >/=  2 cm not described below (TR1): 0 Number of mixed cystic and solid nodules >/= 1.5 cm not described below (TR2): 0 _________________________________________________________ Diffusely enlarged and heterogeneous thyroid gland. Overall, the thyroid gland measures slightly smaller than it did in 2011. Incidental note is made of a tiny cyst in the right mid gland which is considered incidental. No suspicious thyroid nodules. IMPRESSION: Diffusely enlarged and heterogeneous thyroid gland. Despite overall enlargement, the gland measures smaller than it did on remote prior imaging from 2011. No thyroid nodules. The above is in keeping with the ACR TI-RADS recommendations - J Am Coll Radiol 2017;14:587-595. Electronically Signed   By: Jacqulynn Cadet M.D.   On: 11/29/2021 13:27    Pertinent labs & imaging results that were available during my care of the patient were reviewed by me and considered in my medical decision making (see MDM for details).  Medications Ordered in ED Medications  lactated ringers bolus 1,000 mL (has no administration in time range)                                                                                                                                     Procedures Procedures  (including critical care time)  Medical Decision Making / ED Course   This patient presents to the ED for concern of fatigue, neck mass, this involves an extensive number of treatment options, and is a complaint that carries with it a high risk of complications and morbidity.   The differential diagnosis includes hypothyroidism, myxedema coma, multinodular goiter  MDM: Patient seen emergency room for evaluation of fatigue and dysphagia.  Physical exam with a palpable thyroid but is otherwise unremarkable.  Laboratory evaluation with mild hypokalemia to 3.4, creatinine elevation to 1.98 from a baseline of 1.3, calcium slightly elevated at 10.8.  TSH is normal at 3.13.  An ultrasound of the thyroid shows a diffuse heterogenously enlarged thyroid that is actually smaller when compared to an ultrasound of the thyroid performed in 2011.  On reevaluation, patient states that her symptoms are still persistent and she is worried about a mass.  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  ENT was consulted who states the laryngeal paresis can be followed up outpatient.  Outpatient follow-up was given to the patient for Dr. Constance Holster.  With reassuringly negative work-up and fluid resuscitation for a mildly elevated creatinine, patient safe for discharge with outpatient follow-up.  There is no evidence of myxedema  coma today.   Additional history obtained:  -External records from outside source obtained and reviewed including: Chart review including previous notes, labs, imaging, consultation notes   Lab Tests: -I ordered, reviewed, and interpreted labs.   The pertinent results include:   Labs Reviewed  BASIC METABOLIC PANEL - Abnormal; Notable for the following components:      Result Value   Sodium 132 (*)    Potassium 3.4 (*)    Chloride 95 (*)    Glucose, Bld 146 (*)    Creatinine, Ser 1.98 (*)    Calcium 10.8 (*)    GFR, Estimated 26 (*)    All other components within normal limits  CBC  TSH  URINALYSIS, ROUTINE W REFLEX MICROSCOPIC     Imaging Studies ordered: I ordered imaging studies including ultrasound thyroid, CT soft tissue neck I independently visualized and interpreted imaging. I agree with the  radiologist interpretation   Medicines ordered and prescription drug management: Meds ordered this encounter  Medications   lactated ringers bolus 1,000 mL    -I have reviewed the patients home medicines and have made adjustments as needed  Critical interventions none  Consultations Obtained: I requested consultation with the ENT,  and discussed lab and imaging findings as well as pertinent plan - they recommend: Outpatient follow-up   Cardiac Monitoring: The patient was maintained on a cardiac monitor.  I personally viewed and interpreted the cardiac monitored which showed an underlying rhythm of: NSR  Social Determinants of Health:  Factors impacting patients care include: none   Reevaluation: After the interventions noted above, I reevaluated the patient and found that they have :stayed the same  Co morbidities that complicate the patient evaluation  Past Medical History:  Diagnosis Date   Diabetes mellitus without complication ([place])    Hypertension    Hypothyroidism       Dispostion: I considered admission for this patient, and given fairly reassuring work-up, patient does not meet inpatient criteria for admission she is safe for discharge with outpatient follow-up.     Final Clinical Impression(s) / ED Diagnoses Final diagnoses:  None     @PCDICTATION @    Teressa Lower, MD 11/29/21 [phone]

## 2021-11-29 NOTE — ED Notes (Signed)
Patient used restroom without obtaining urine sample. Patient educated on need for urine sample.

## 2021-11-29 NOTE — ED Notes (Signed)
Second nurse attempting IV access at this time.  

## 2021-11-29 NOTE — ED Triage Notes (Addendum)
Pt reports feeling fatigued and feeling like she has a lump in her throat.  Pt says she feels it "moving around."  Denies any difficulty breathing or swallowing.    Pt says has history of a thyroid goiter.  Saw PCP this morning and he sent pt to ED for further eval.

## 2021-11-29 NOTE — ED Notes (Addendum)
Attempted IV access x 2 without success.

## 2021-11-29 NOTE — ED Notes (Signed)
Patient stated that she felt like her blood sugar was dropping. CBG obtained and orange juice provided per request from patient.

## 2021-12-17 DIAGNOSIS — E042 Nontoxic multinodular goiter: Secondary | ICD-10-CM | POA: Diagnosis not present

## 2021-12-17 DIAGNOSIS — B0223 Postherpetic polyneuropathy: Secondary | ICD-10-CM | POA: Diagnosis not present

## 2021-12-17 DIAGNOSIS — Z01810 Encounter for preprocedural cardiovascular examination: Secondary | ICD-10-CM | POA: Diagnosis not present

## 2021-12-17 DIAGNOSIS — E1122 Type 2 diabetes mellitus with diabetic chronic kidney disease: Secondary | ICD-10-CM | POA: Diagnosis not present

## 2021-12-17 DIAGNOSIS — R531 Weakness: Secondary | ICD-10-CM | POA: Diagnosis not present

## 2021-12-17 DIAGNOSIS — E039 Hypothyroidism, unspecified: Secondary | ICD-10-CM | POA: Diagnosis not present

## 2021-12-17 DIAGNOSIS — I10 Essential (primary) hypertension: Secondary | ICD-10-CM | POA: Diagnosis not present

## 2021-12-17 DIAGNOSIS — Z6841 Body Mass Index (BMI) 40.0 and over, adult: Secondary | ICD-10-CM | POA: Diagnosis not present

## 2021-12-27 DIAGNOSIS — N189 Chronic kidney disease, unspecified: Secondary | ICD-10-CM | POA: Diagnosis not present

## 2021-12-27 DIAGNOSIS — I129 Hypertensive chronic kidney disease with stage 1 through stage 4 chronic kidney disease, or unspecified chronic kidney disease: Secondary | ICD-10-CM | POA: Diagnosis not present

## 2021-12-27 DIAGNOSIS — E871 Hypo-osmolality and hyponatremia: Secondary | ICD-10-CM | POA: Diagnosis not present

## 2021-12-27 DIAGNOSIS — E1122 Type 2 diabetes mellitus with diabetic chronic kidney disease: Secondary | ICD-10-CM | POA: Diagnosis not present

## 2021-12-27 DIAGNOSIS — E876 Hypokalemia: Secondary | ICD-10-CM | POA: Diagnosis not present

## 2021-12-28 DIAGNOSIS — R9439 Abnormal result of other cardiovascular function study: Secondary | ICD-10-CM | POA: Diagnosis not present

## 2021-12-28 DIAGNOSIS — Z01818 Encounter for other preprocedural examination: Secondary | ICD-10-CM | POA: Diagnosis not present

## 2021-12-28 DIAGNOSIS — I10 Essential (primary) hypertension: Secondary | ICD-10-CM | POA: Diagnosis not present

## 2021-12-28 DIAGNOSIS — R531 Weakness: Secondary | ICD-10-CM | POA: Diagnosis not present

## 2021-12-28 DIAGNOSIS — Z01810 Encounter for preprocedural cardiovascular examination: Secondary | ICD-10-CM | POA: Diagnosis not present

## 2022-01-11 DIAGNOSIS — E1122 Type 2 diabetes mellitus with diabetic chronic kidney disease: Secondary | ICD-10-CM | POA: Diagnosis not present

## 2022-01-11 DIAGNOSIS — N184 Chronic kidney disease, stage 4 (severe): Secondary | ICD-10-CM | POA: Diagnosis not present

## 2022-01-11 DIAGNOSIS — E7849 Other hyperlipidemia: Secondary | ICD-10-CM | POA: Diagnosis not present

## 2022-01-22 ENCOUNTER — Other Ambulatory Visit (HOSPITAL_COMMUNITY): Payer: Self-pay | Admitting: Nephrology

## 2022-01-29 ENCOUNTER — Encounter (HOSPITAL_COMMUNITY)
Admission: RE | Admit: 2022-01-29 | Discharge: 2022-01-29 | Disposition: A | Discharge status: Home / Self Care | Payer: Medicare HMO | Source: Ambulatory Visit | Attending: Nephrology | Admitting: Nephrology

## 2022-01-29 MED ORDER — TECHNETIUM TC 99M SESTAMIBI - CARDIOLITE
25.0000 | Freq: Once | INTRAVENOUS | Status: AC | PRN
  Administered 2022-01-29: 25.8 via INTRAVENOUS

## 2022-02-11 DIAGNOSIS — E7849 Other hyperlipidemia: Secondary | ICD-10-CM | POA: Diagnosis not present

## 2022-02-11 DIAGNOSIS — E1122 Type 2 diabetes mellitus with diabetic chronic kidney disease: Secondary | ICD-10-CM | POA: Diagnosis not present

## 2022-02-11 DIAGNOSIS — N184 Chronic kidney disease, stage 4 (severe): Secondary | ICD-10-CM | POA: Diagnosis not present

## 2022-02-11 DIAGNOSIS — I129 Hypertensive chronic kidney disease with stage 1 through stage 4 chronic kidney disease, or unspecified chronic kidney disease: Secondary | ICD-10-CM | POA: Diagnosis not present

## 2022-02-20 DIAGNOSIS — N189 Chronic kidney disease, unspecified: Secondary | ICD-10-CM | POA: Diagnosis not present

## 2022-02-20 DIAGNOSIS — E876 Hypokalemia: Secondary | ICD-10-CM | POA: Diagnosis not present

## 2022-02-20 DIAGNOSIS — I129 Hypertensive chronic kidney disease with stage 1 through stage 4 chronic kidney disease, or unspecified chronic kidney disease: Secondary | ICD-10-CM | POA: Diagnosis not present

## 2022-02-20 DIAGNOSIS — E1122 Type 2 diabetes mellitus with diabetic chronic kidney disease: Secondary | ICD-10-CM | POA: Diagnosis not present

## 2022-02-20 DIAGNOSIS — E871 Hypo-osmolality and hyponatremia: Secondary | ICD-10-CM | POA: Diagnosis not present

## 2022-03-05 DIAGNOSIS — E1122 Type 2 diabetes mellitus with diabetic chronic kidney disease: Secondary | ICD-10-CM | POA: Diagnosis not present

## 2022-03-05 DIAGNOSIS — N189 Chronic kidney disease, unspecified: Secondary | ICD-10-CM | POA: Diagnosis not present

## 2022-03-05 DIAGNOSIS — E871 Hypo-osmolality and hyponatremia: Secondary | ICD-10-CM | POA: Diagnosis not present

## 2022-03-05 DIAGNOSIS — E876 Hypokalemia: Secondary | ICD-10-CM | POA: Diagnosis not present

## 2022-03-05 DIAGNOSIS — I129 Hypertensive chronic kidney disease with stage 1 through stage 4 chronic kidney disease, or unspecified chronic kidney disease: Secondary | ICD-10-CM | POA: Diagnosis not present

## 2022-03-13 DIAGNOSIS — I129 Hypertensive chronic kidney disease with stage 1 through stage 4 chronic kidney disease, or unspecified chronic kidney disease: Secondary | ICD-10-CM | POA: Diagnosis not present

## 2022-03-13 DIAGNOSIS — E1122 Type 2 diabetes mellitus with diabetic chronic kidney disease: Secondary | ICD-10-CM | POA: Diagnosis not present

## 2022-03-13 DIAGNOSIS — N189 Chronic kidney disease, unspecified: Secondary | ICD-10-CM | POA: Diagnosis not present

## 2022-03-13 DIAGNOSIS — E875 Hyperkalemia: Secondary | ICD-10-CM | POA: Diagnosis not present

## 2022-03-13 DIAGNOSIS — E211 Secondary hyperparathyroidism, not elsewhere classified: Secondary | ICD-10-CM | POA: Diagnosis not present

## 2022-03-15 DIAGNOSIS — E782 Mixed hyperlipidemia: Secondary | ICD-10-CM | POA: Diagnosis not present

## 2022-03-15 DIAGNOSIS — I10 Essential (primary) hypertension: Secondary | ICD-10-CM | POA: Diagnosis not present

## 2022-03-15 DIAGNOSIS — N184 Chronic kidney disease, stage 4 (severe): Secondary | ICD-10-CM | POA: Diagnosis not present

## 2022-03-15 DIAGNOSIS — E7849 Other hyperlipidemia: Secondary | ICD-10-CM | POA: Diagnosis not present

## 2022-03-15 DIAGNOSIS — E1122 Type 2 diabetes mellitus with diabetic chronic kidney disease: Secondary | ICD-10-CM | POA: Diagnosis not present

## 2022-03-19 DIAGNOSIS — B0223 Postherpetic polyneuropathy: Secondary | ICD-10-CM | POA: Diagnosis not present

## 2022-03-19 DIAGNOSIS — M1711 Unilateral primary osteoarthritis, right knee: Secondary | ICD-10-CM | POA: Diagnosis not present

## 2022-03-19 DIAGNOSIS — E1122 Type 2 diabetes mellitus with diabetic chronic kidney disease: Secondary | ICD-10-CM | POA: Diagnosis not present

## 2022-03-19 DIAGNOSIS — I10 Essential (primary) hypertension: Secondary | ICD-10-CM | POA: Diagnosis not present

## 2022-03-19 DIAGNOSIS — N184 Chronic kidney disease, stage 4 (severe): Secondary | ICD-10-CM | POA: Diagnosis not present

## 2022-03-19 DIAGNOSIS — Z6841 Body Mass Index (BMI) 40.0 and over, adult: Secondary | ICD-10-CM | POA: Diagnosis not present

## 2022-03-19 DIAGNOSIS — E042 Nontoxic multinodular goiter: Secondary | ICD-10-CM | POA: Diagnosis not present

## 2022-03-19 DIAGNOSIS — E039 Hypothyroidism, unspecified: Secondary | ICD-10-CM | POA: Diagnosis not present

## 2022-04-03 DIAGNOSIS — E875 Hyperkalemia: Secondary | ICD-10-CM | POA: Diagnosis not present

## 2022-04-03 DIAGNOSIS — E211 Secondary hyperparathyroidism, not elsewhere classified: Secondary | ICD-10-CM | POA: Diagnosis not present

## 2022-04-03 DIAGNOSIS — I129 Hypertensive chronic kidney disease with stage 1 through stage 4 chronic kidney disease, or unspecified chronic kidney disease: Secondary | ICD-10-CM | POA: Diagnosis not present

## 2022-04-03 DIAGNOSIS — N189 Chronic kidney disease, unspecified: Secondary | ICD-10-CM | POA: Diagnosis not present

## 2022-04-03 DIAGNOSIS — E1122 Type 2 diabetes mellitus with diabetic chronic kidney disease: Secondary | ICD-10-CM | POA: Diagnosis not present

## 2022-05-08 DIAGNOSIS — I129 Hypertensive chronic kidney disease with stage 1 through stage 4 chronic kidney disease, or unspecified chronic kidney disease: Secondary | ICD-10-CM | POA: Diagnosis not present

## 2022-05-08 DIAGNOSIS — N189 Chronic kidney disease, unspecified: Secondary | ICD-10-CM | POA: Diagnosis not present

## 2022-05-08 DIAGNOSIS — E875 Hyperkalemia: Secondary | ICD-10-CM | POA: Diagnosis not present

## 2022-05-08 DIAGNOSIS — E211 Secondary hyperparathyroidism, not elsewhere classified: Secondary | ICD-10-CM | POA: Diagnosis not present

## 2022-05-08 DIAGNOSIS — E1122 Type 2 diabetes mellitus with diabetic chronic kidney disease: Secondary | ICD-10-CM | POA: Diagnosis not present

## 2022-05-14 DIAGNOSIS — E1165 Type 2 diabetes mellitus with hyperglycemia: Secondary | ICD-10-CM | POA: Diagnosis not present

## 2022-05-14 DIAGNOSIS — E039 Hypothyroidism, unspecified: Secondary | ICD-10-CM | POA: Diagnosis not present

## 2022-05-14 DIAGNOSIS — I10 Essential (primary) hypertension: Secondary | ICD-10-CM | POA: Diagnosis not present

## 2022-05-14 DIAGNOSIS — E782 Mixed hyperlipidemia: Secondary | ICD-10-CM | POA: Diagnosis not present

## 2022-05-22 DIAGNOSIS — E211 Secondary hyperparathyroidism, not elsewhere classified: Secondary | ICD-10-CM | POA: Diagnosis not present

## 2022-05-22 DIAGNOSIS — E1122 Type 2 diabetes mellitus with diabetic chronic kidney disease: Secondary | ICD-10-CM | POA: Diagnosis not present

## 2022-05-22 DIAGNOSIS — N189 Chronic kidney disease, unspecified: Secondary | ICD-10-CM | POA: Diagnosis not present

## 2022-05-22 DIAGNOSIS — E875 Hyperkalemia: Secondary | ICD-10-CM | POA: Diagnosis not present

## 2022-05-22 DIAGNOSIS — I129 Hypertensive chronic kidney disease with stage 1 through stage 4 chronic kidney disease, or unspecified chronic kidney disease: Secondary | ICD-10-CM | POA: Diagnosis not present

## 2022-05-31 DIAGNOSIS — B370 Candidal stomatitis: Secondary | ICD-10-CM | POA: Diagnosis not present

## 2022-06-07 DIAGNOSIS — T7840XA Allergy, unspecified, initial encounter: Secondary | ICD-10-CM | POA: Diagnosis not present

## 2022-06-12 DIAGNOSIS — E876 Hypokalemia: Secondary | ICD-10-CM | POA: Diagnosis not present

## 2022-06-12 DIAGNOSIS — E1165 Type 2 diabetes mellitus with hyperglycemia: Secondary | ICD-10-CM | POA: Diagnosis not present

## 2022-06-12 DIAGNOSIS — E559 Vitamin D deficiency, unspecified: Secondary | ICD-10-CM | POA: Diagnosis not present

## 2022-06-12 DIAGNOSIS — E039 Hypothyroidism, unspecified: Secondary | ICD-10-CM | POA: Diagnosis not present

## 2022-06-12 DIAGNOSIS — N189 Chronic kidney disease, unspecified: Secondary | ICD-10-CM | POA: Diagnosis not present

## 2022-06-12 DIAGNOSIS — I10 Essential (primary) hypertension: Secondary | ICD-10-CM | POA: Diagnosis not present

## 2022-06-12 DIAGNOSIS — E7849 Other hyperlipidemia: Secondary | ICD-10-CM | POA: Diagnosis not present

## 2022-06-12 DIAGNOSIS — E1122 Type 2 diabetes mellitus with diabetic chronic kidney disease: Secondary | ICD-10-CM | POA: Diagnosis not present

## 2022-06-19 DIAGNOSIS — E042 Nontoxic multinodular goiter: Secondary | ICD-10-CM | POA: Diagnosis not present

## 2022-06-19 DIAGNOSIS — E1122 Type 2 diabetes mellitus with diabetic chronic kidney disease: Secondary | ICD-10-CM | POA: Diagnosis not present

## 2022-06-19 DIAGNOSIS — E7849 Other hyperlipidemia: Secondary | ICD-10-CM | POA: Diagnosis not present

## 2022-06-19 DIAGNOSIS — M1711 Unilateral primary osteoarthritis, right knee: Secondary | ICD-10-CM | POA: Diagnosis not present

## 2022-06-19 DIAGNOSIS — E039 Hypothyroidism, unspecified: Secondary | ICD-10-CM | POA: Diagnosis not present

## 2022-06-19 DIAGNOSIS — K148 Other diseases of tongue: Secondary | ICD-10-CM | POA: Diagnosis not present

## 2022-06-19 DIAGNOSIS — I10 Essential (primary) hypertension: Secondary | ICD-10-CM | POA: Diagnosis not present

## 2022-06-19 DIAGNOSIS — Z6841 Body Mass Index (BMI) 40.0 and over, adult: Secondary | ICD-10-CM | POA: Diagnosis not present

## 2022-06-19 DIAGNOSIS — N184 Chronic kidney disease, stage 4 (severe): Secondary | ICD-10-CM | POA: Diagnosis not present

## 2022-06-19 DIAGNOSIS — R809 Proteinuria, unspecified: Secondary | ICD-10-CM | POA: Diagnosis not present

## 2022-06-19 DIAGNOSIS — B0223 Postherpetic polyneuropathy: Secondary | ICD-10-CM | POA: Diagnosis not present

## 2022-07-11 DIAGNOSIS — N189 Chronic kidney disease, unspecified: Secondary | ICD-10-CM | POA: Diagnosis not present

## 2022-07-11 DIAGNOSIS — I129 Hypertensive chronic kidney disease with stage 1 through stage 4 chronic kidney disease, or unspecified chronic kidney disease: Secondary | ICD-10-CM | POA: Diagnosis not present

## 2022-07-11 DIAGNOSIS — E1122 Type 2 diabetes mellitus with diabetic chronic kidney disease: Secondary | ICD-10-CM | POA: Diagnosis not present

## 2022-07-11 DIAGNOSIS — E876 Hypokalemia: Secondary | ICD-10-CM | POA: Diagnosis not present

## 2022-07-14 DIAGNOSIS — I10 Essential (primary) hypertension: Secondary | ICD-10-CM | POA: Diagnosis not present

## 2022-07-14 DIAGNOSIS — E039 Hypothyroidism, unspecified: Secondary | ICD-10-CM | POA: Diagnosis not present

## 2022-07-14 DIAGNOSIS — E782 Mixed hyperlipidemia: Secondary | ICD-10-CM | POA: Diagnosis not present

## 2022-07-14 DIAGNOSIS — E1165 Type 2 diabetes mellitus with hyperglycemia: Secondary | ICD-10-CM | POA: Diagnosis not present

## 2022-08-06 DIAGNOSIS — N189 Chronic kidney disease, unspecified: Secondary | ICD-10-CM | POA: Diagnosis not present

## 2022-08-06 DIAGNOSIS — I129 Hypertensive chronic kidney disease with stage 1 through stage 4 chronic kidney disease, or unspecified chronic kidney disease: Secondary | ICD-10-CM | POA: Diagnosis not present

## 2022-08-06 DIAGNOSIS — E876 Hypokalemia: Secondary | ICD-10-CM | POA: Diagnosis not present

## 2022-08-06 DIAGNOSIS — E1122 Type 2 diabetes mellitus with diabetic chronic kidney disease: Secondary | ICD-10-CM | POA: Diagnosis not present

## 2022-08-08 DIAGNOSIS — H35033 Hypertensive retinopathy, bilateral: Secondary | ICD-10-CM | POA: Diagnosis not present

## 2022-08-22 DIAGNOSIS — H34231 Retinal artery branch occlusion, right eye: Secondary | ICD-10-CM | POA: Diagnosis not present

## 2022-08-22 DIAGNOSIS — H2512 Age-related nuclear cataract, left eye: Secondary | ICD-10-CM | POA: Diagnosis not present

## 2022-08-29 DIAGNOSIS — E1122 Type 2 diabetes mellitus with diabetic chronic kidney disease: Secondary | ICD-10-CM | POA: Diagnosis not present

## 2022-08-29 DIAGNOSIS — I129 Hypertensive chronic kidney disease with stage 1 through stage 4 chronic kidney disease, or unspecified chronic kidney disease: Secondary | ICD-10-CM | POA: Diagnosis not present

## 2022-08-29 DIAGNOSIS — E876 Hypokalemia: Secondary | ICD-10-CM | POA: Diagnosis not present

## 2022-08-29 DIAGNOSIS — N189 Chronic kidney disease, unspecified: Secondary | ICD-10-CM | POA: Diagnosis not present

## 2022-09-16 DIAGNOSIS — N184 Chronic kidney disease, stage 4 (severe): Secondary | ICD-10-CM | POA: Diagnosis not present

## 2022-09-16 DIAGNOSIS — E1122 Type 2 diabetes mellitus with diabetic chronic kidney disease: Secondary | ICD-10-CM | POA: Diagnosis not present

## 2022-09-16 DIAGNOSIS — E1165 Type 2 diabetes mellitus with hyperglycemia: Secondary | ICD-10-CM | POA: Diagnosis not present

## 2022-09-16 DIAGNOSIS — I10 Essential (primary) hypertension: Secondary | ICD-10-CM | POA: Diagnosis not present

## 2022-09-18 DIAGNOSIS — I129 Hypertensive chronic kidney disease with stage 1 through stage 4 chronic kidney disease, or unspecified chronic kidney disease: Secondary | ICD-10-CM | POA: Diagnosis not present

## 2022-09-18 DIAGNOSIS — N189 Chronic kidney disease, unspecified: Secondary | ICD-10-CM | POA: Diagnosis not present

## 2022-09-18 DIAGNOSIS — E876 Hypokalemia: Secondary | ICD-10-CM | POA: Diagnosis not present

## 2022-09-18 DIAGNOSIS — E1122 Type 2 diabetes mellitus with diabetic chronic kidney disease: Secondary | ICD-10-CM | POA: Diagnosis not present

## 2022-09-23 DIAGNOSIS — Z6841 Body Mass Index (BMI) 40.0 and over, adult: Secondary | ICD-10-CM | POA: Diagnosis not present

## 2022-09-23 DIAGNOSIS — R030 Elevated blood-pressure reading, without diagnosis of hypertension: Secondary | ICD-10-CM | POA: Diagnosis not present

## 2022-09-23 DIAGNOSIS — L853 Xerosis cutis: Secondary | ICD-10-CM | POA: Diagnosis not present

## 2022-09-23 DIAGNOSIS — N184 Chronic kidney disease, stage 4 (severe): Secondary | ICD-10-CM | POA: Diagnosis not present

## 2022-09-23 DIAGNOSIS — M1711 Unilateral primary osteoarthritis, right knee: Secondary | ICD-10-CM | POA: Diagnosis not present

## 2022-09-23 DIAGNOSIS — E1122 Type 2 diabetes mellitus with diabetic chronic kidney disease: Secondary | ICD-10-CM | POA: Diagnosis not present

## 2022-09-23 DIAGNOSIS — E039 Hypothyroidism, unspecified: Secondary | ICD-10-CM | POA: Diagnosis not present

## 2022-10-21 DIAGNOSIS — H2511 Age-related nuclear cataract, right eye: Secondary | ICD-10-CM | POA: Diagnosis not present

## 2022-10-21 DIAGNOSIS — H04123 Dry eye syndrome of bilateral lacrimal glands: Secondary | ICD-10-CM | POA: Diagnosis not present

## 2022-10-21 DIAGNOSIS — H31091 Other chorioretinal scars, right eye: Secondary | ICD-10-CM | POA: Diagnosis not present

## 2022-10-21 DIAGNOSIS — H2512 Age-related nuclear cataract, left eye: Secondary | ICD-10-CM | POA: Diagnosis not present

## 2022-11-13 DIAGNOSIS — H25011 Cortical age-related cataract, right eye: Secondary | ICD-10-CM | POA: Diagnosis not present

## 2022-11-13 DIAGNOSIS — H2511 Age-related nuclear cataract, right eye: Secondary | ICD-10-CM | POA: Diagnosis not present

## 2022-11-26 DIAGNOSIS — L600 Ingrowing nail: Secondary | ICD-10-CM | POA: Diagnosis not present

## 2022-11-26 DIAGNOSIS — M2011 Hallux valgus (acquired), right foot: Secondary | ICD-10-CM | POA: Diagnosis not present

## 2022-11-26 DIAGNOSIS — E119 Type 2 diabetes mellitus without complications: Secondary | ICD-10-CM | POA: Diagnosis not present

## 2022-11-26 DIAGNOSIS — M79671 Pain in right foot: Secondary | ICD-10-CM | POA: Diagnosis not present

## 2022-11-26 DIAGNOSIS — M109 Gout, unspecified: Secondary | ICD-10-CM | POA: Diagnosis not present

## 2022-11-26 DIAGNOSIS — M79672 Pain in left foot: Secondary | ICD-10-CM | POA: Diagnosis not present

## 2022-11-26 DIAGNOSIS — N182 Chronic kidney disease, stage 2 (mild): Secondary | ICD-10-CM | POA: Diagnosis not present

## 2022-12-13 DIAGNOSIS — Z961 Presence of intraocular lens: Secondary | ICD-10-CM | POA: Diagnosis not present

## 2022-12-13 DIAGNOSIS — H2512 Age-related nuclear cataract, left eye: Secondary | ICD-10-CM | POA: Diagnosis not present

## 2022-12-18 DIAGNOSIS — E7849 Other hyperlipidemia: Secondary | ICD-10-CM | POA: Diagnosis not present

## 2022-12-18 DIAGNOSIS — N189 Chronic kidney disease, unspecified: Secondary | ICD-10-CM | POA: Diagnosis not present

## 2022-12-18 DIAGNOSIS — E1165 Type 2 diabetes mellitus with hyperglycemia: Secondary | ICD-10-CM | POA: Diagnosis not present

## 2022-12-18 DIAGNOSIS — E1122 Type 2 diabetes mellitus with diabetic chronic kidney disease: Secondary | ICD-10-CM | POA: Diagnosis not present

## 2022-12-25 DIAGNOSIS — M1711 Unilateral primary osteoarthritis, right knee: Secondary | ICD-10-CM | POA: Diagnosis not present

## 2022-12-25 DIAGNOSIS — Z0001 Encounter for general adult medical examination with abnormal findings: Secondary | ICD-10-CM | POA: Diagnosis not present

## 2022-12-25 DIAGNOSIS — E039 Hypothyroidism, unspecified: Secondary | ICD-10-CM | POA: Diagnosis not present

## 2022-12-25 DIAGNOSIS — I10 Essential (primary) hypertension: Secondary | ICD-10-CM | POA: Diagnosis not present

## 2022-12-25 DIAGNOSIS — E7849 Other hyperlipidemia: Secondary | ICD-10-CM | POA: Diagnosis not present

## 2022-12-25 DIAGNOSIS — R682 Dry mouth, unspecified: Secondary | ICD-10-CM | POA: Diagnosis not present

## 2022-12-25 DIAGNOSIS — E042 Nontoxic multinodular goiter: Secondary | ICD-10-CM | POA: Diagnosis not present

## 2022-12-25 DIAGNOSIS — E1122 Type 2 diabetes mellitus with diabetic chronic kidney disease: Secondary | ICD-10-CM | POA: Diagnosis not present

## 2022-12-26 DIAGNOSIS — M1711 Unilateral primary osteoarthritis, right knee: Secondary | ICD-10-CM | POA: Diagnosis not present

## 2022-12-26 DIAGNOSIS — M170 Bilateral primary osteoarthritis of knee: Secondary | ICD-10-CM | POA: Diagnosis not present

## 2023-01-09 DIAGNOSIS — E876 Hypokalemia: Secondary | ICD-10-CM | POA: Diagnosis not present

## 2023-01-09 DIAGNOSIS — N1832 Chronic kidney disease, stage 3b: Secondary | ICD-10-CM | POA: Diagnosis not present

## 2023-01-09 DIAGNOSIS — N189 Chronic kidney disease, unspecified: Secondary | ICD-10-CM | POA: Diagnosis not present

## 2023-01-09 DIAGNOSIS — E1122 Type 2 diabetes mellitus with diabetic chronic kidney disease: Secondary | ICD-10-CM | POA: Diagnosis not present

## 2023-02-19 DIAGNOSIS — H2512 Age-related nuclear cataract, left eye: Secondary | ICD-10-CM | POA: Diagnosis not present

## 2023-02-19 DIAGNOSIS — H25012 Cortical age-related cataract, left eye: Secondary | ICD-10-CM | POA: Diagnosis not present

## 2023-03-10 IMAGING — CT CT NECK W/O CM
4 of 5 series · 14 of 33 positions shown, 16 images · non-contrast
Comparison: None Available.

CLINICAL DATA: Palate weakness.  Rule out mass

EXAM:
CT NECK WITHOUT CONTRAST
TECHNIQUE: Multidetector CT imaging of the neck was performed following the
standard protocol without intravenous contrast.
RADIATION DOSE REDUCTION: This exam was performed according to the
departmental dose-optimization program which includes automated
exposure control, adjustment of the mA and/or kV according to
patient size and/or use of iterative reconstruction technique.

[Series 2: axial neck · axial · 0.53mm/px · z∈[+108,+220]mm · 3 of 114 slices shown, 4 images]
[im 29/114  soft-tissue]
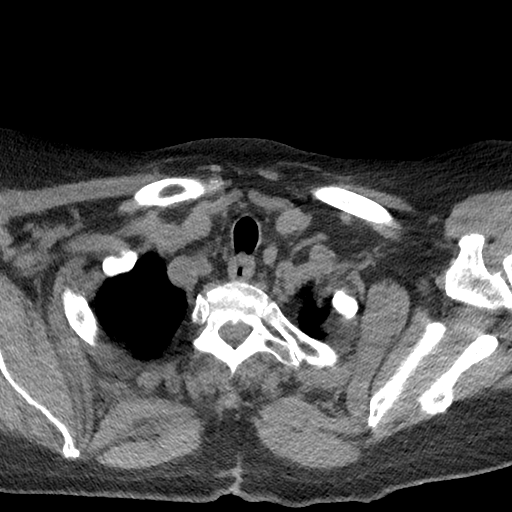
[im 29/114  bone]
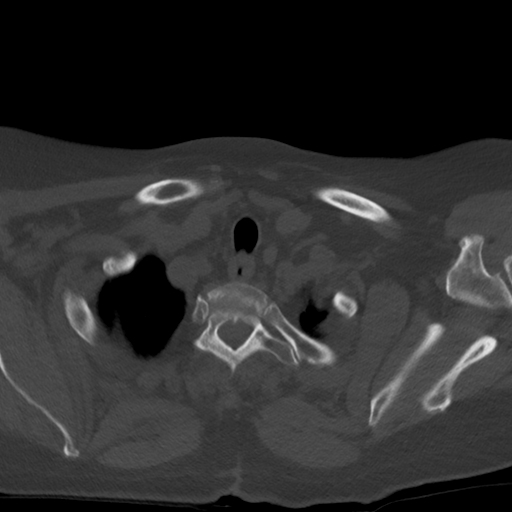
[im 57/114  bone]
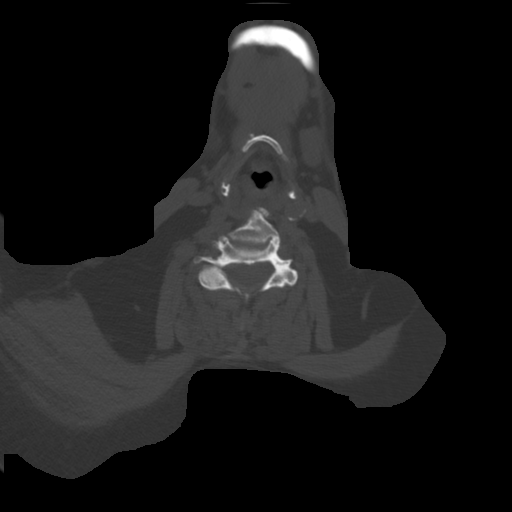
[im 85/114  bone]
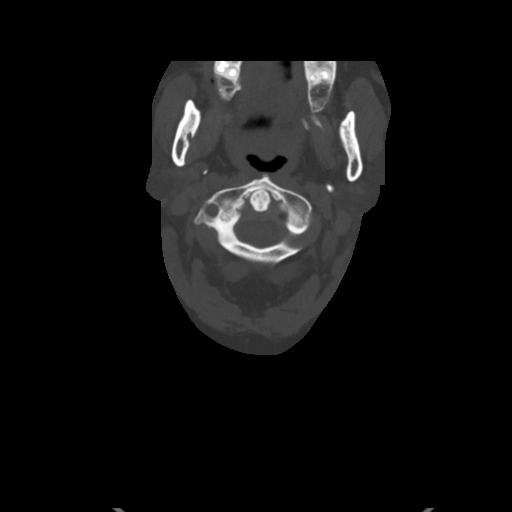

[Series 4: cor neck · coronal · 0.46mm/px · 3 of 104 slices shown]
[im 21/104  bone]
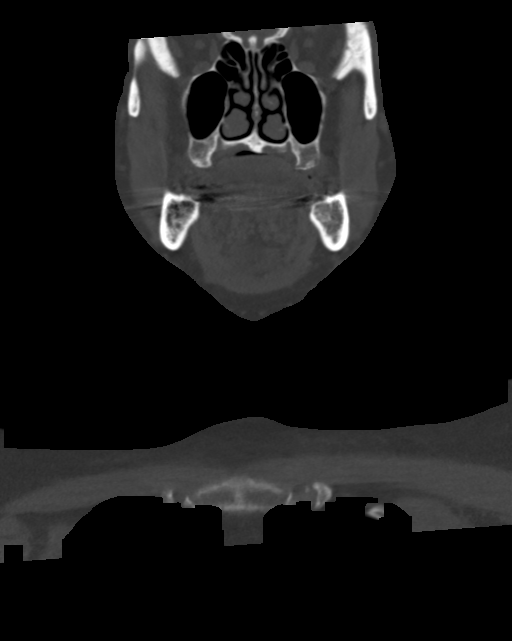
[im 42/104  bone]
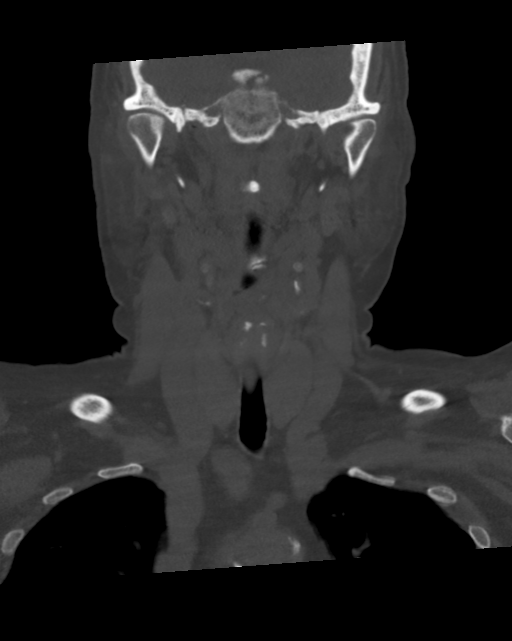
[im 62/104  bone]
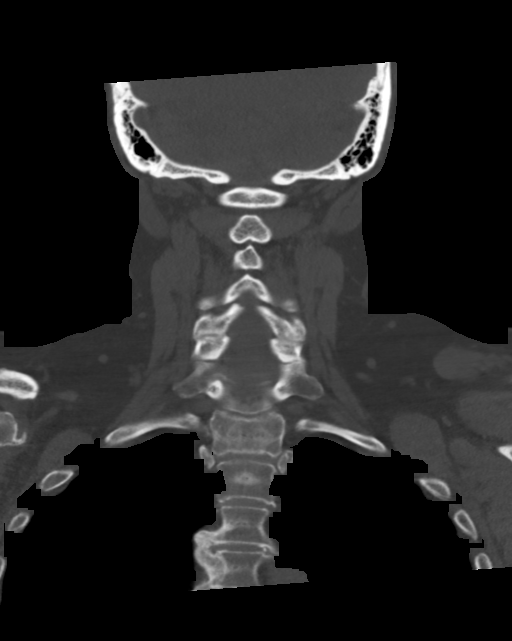

[Series 5: sag neck · sagittal · 0.45mm/px · 5 of 101 slices shown, 6 images]
[im 34/101  bone]
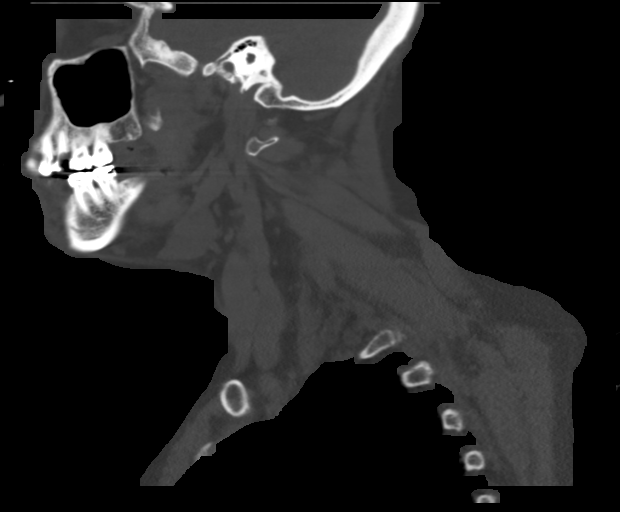
[im 42/101  bone]
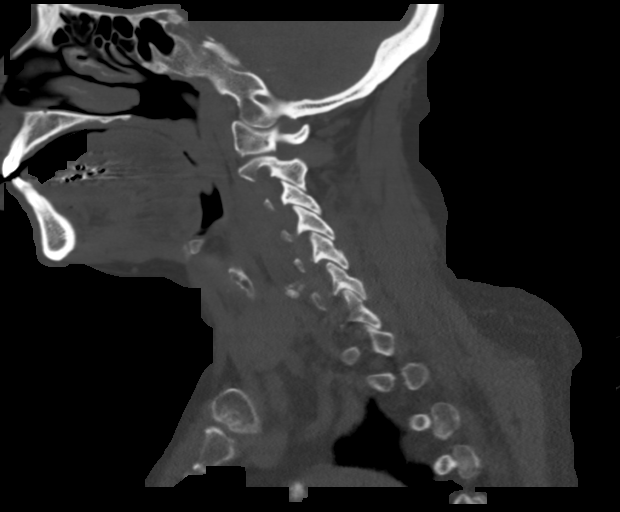
[im 51/101  soft-tissue]
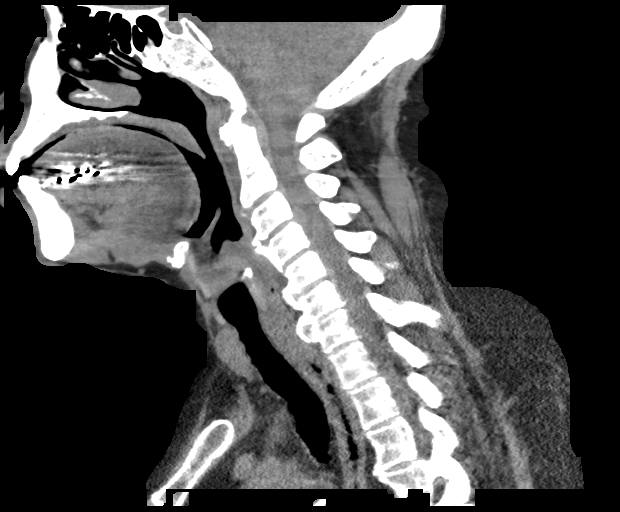
[im 51/101  bone]
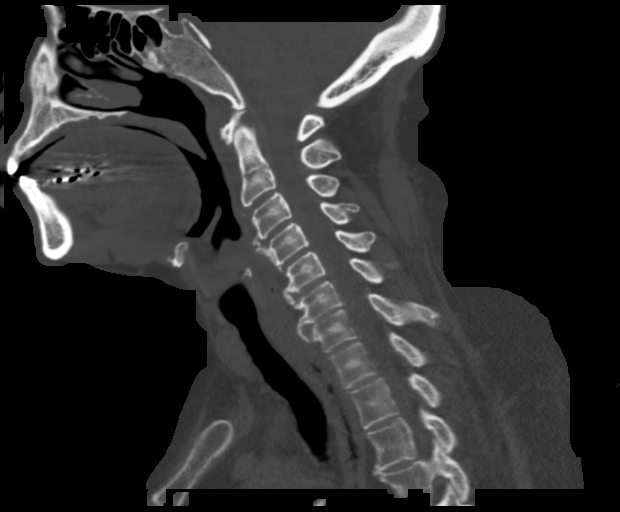
[im 59/101  bone]
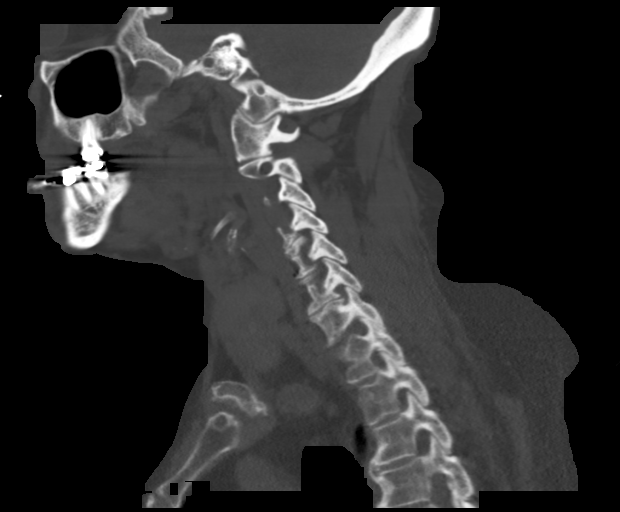
[im 67/101  bone]
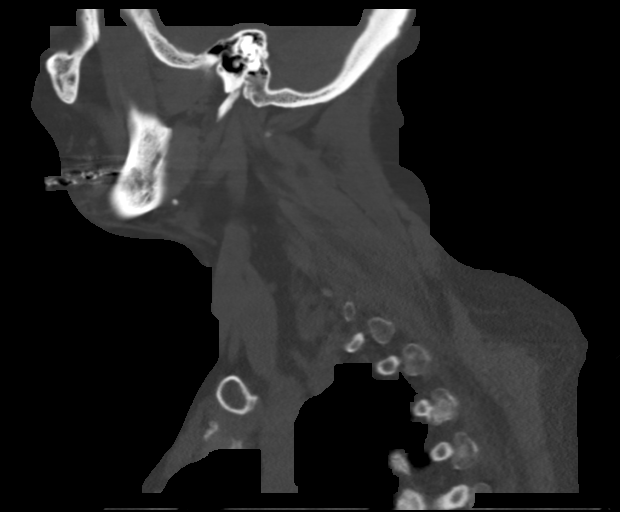

[Series 6: ax oropharynx (person_name) · axial · 0.42mm/px · z∈[+73,+201]mm · 3 of 134 slices shown]
[im 34/134  bone]
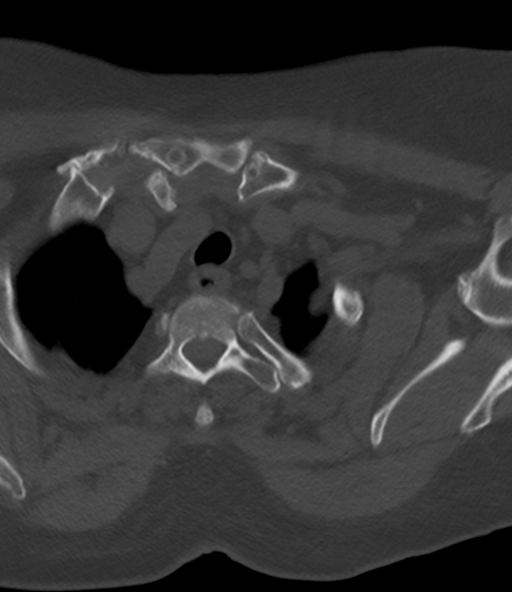
[im 67/134  bone]
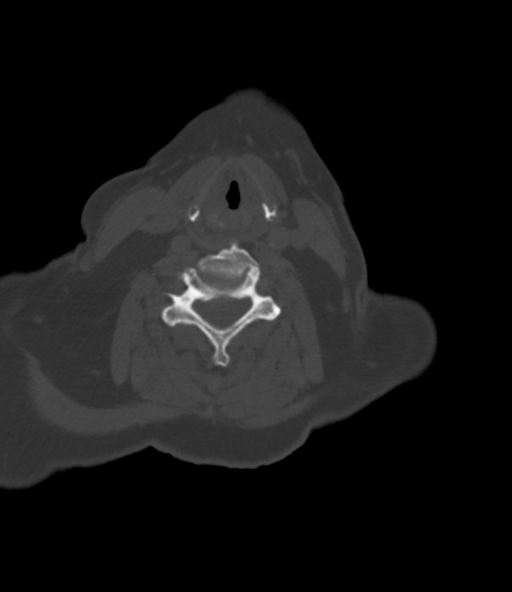
[im 100/134  bone]
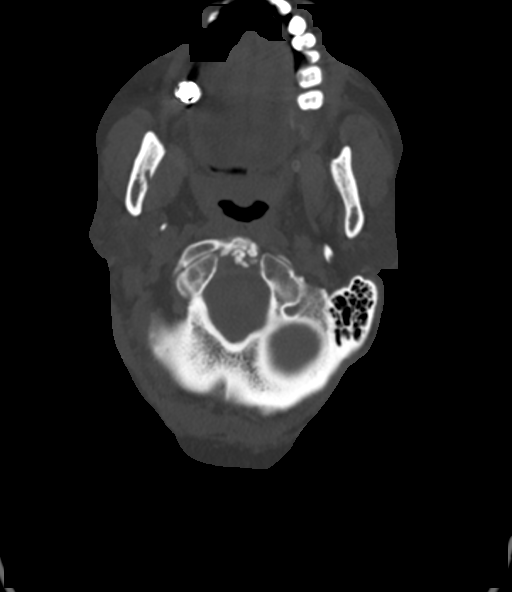

[14 of 33 positions shown; findings below may reference images not displayed]

FINDINGS: Pharynx and larynx: Normal pharynx.

Mild asymmetry of the vocal cords. Possible recurrent laryngeal
nerve paresis on the right. Mild enlargement of the right piriform
sinus and medial deviation of the right vocal cord. No mass lesion.

Salivary glands: No inflammation, mass, or stone.

Thyroid: Thyromegaly without focal nodule.

Lymph nodes: No enlarged lymph nodes in the neck.

Vascular: Limited vascular evaluation without intravenous contrast.
Mild atherosclerotic calcification in the carotid artery
bilaterally.

Limited intracranial: Negative

Visualized orbits: Negative

Mastoids and visualized paranasal sinuses: Negative

Skeleton: Cervical spondylosis. Right foraminal narrowing due to
spurring C3-4, C4-5, C5-6, C6-7. No acute skeletal abnormality.

No skull base mass lesion.

Upper chest: Lung apices clear bilaterally

Other: None
IMPRESSION: Negative for mass or adenopathy in the neck

Mild asymmetry of the vocal cords. Possible right recurrent
laryngeal paresis. Correlate with direct visualization.

## 2023-03-10 IMAGING — US US THYROID
1 series · 13 of 25 positions shown · non-contrast
Comparison: Remote prior thyroid ultrasound 09/25/2009

CLINICAL DATA: Other.  Globus sensation x1 day

EXAM:
THYROID ULTRASOUND
TECHNIQUE: Ultrasound examination of the thyroid gland and adjacent soft
tissues was performed.

[Series 1: us thyroid · 13 of 54 slices shown]
[im 1/54]
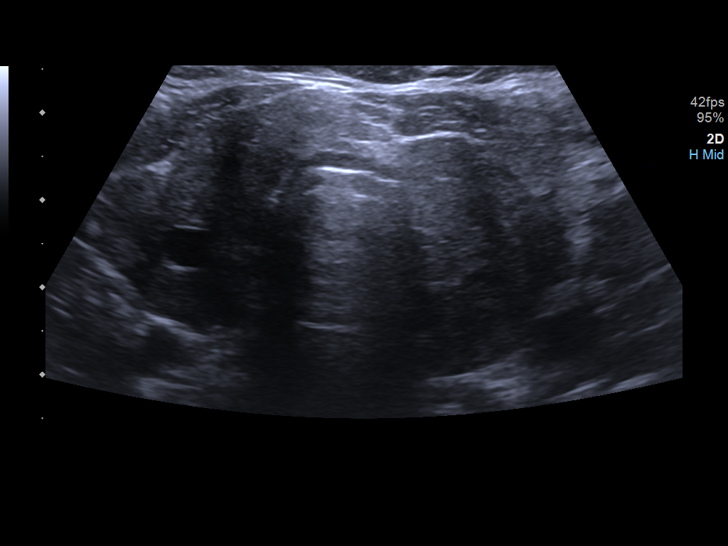
[im 5/54]
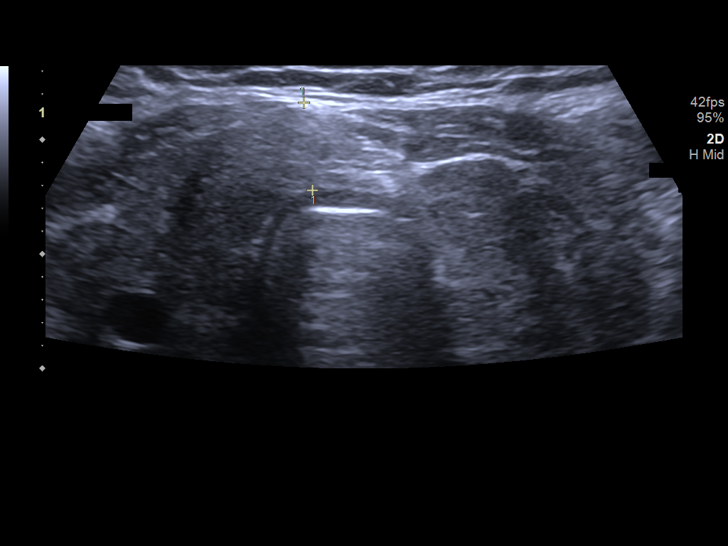
[im 9/54]
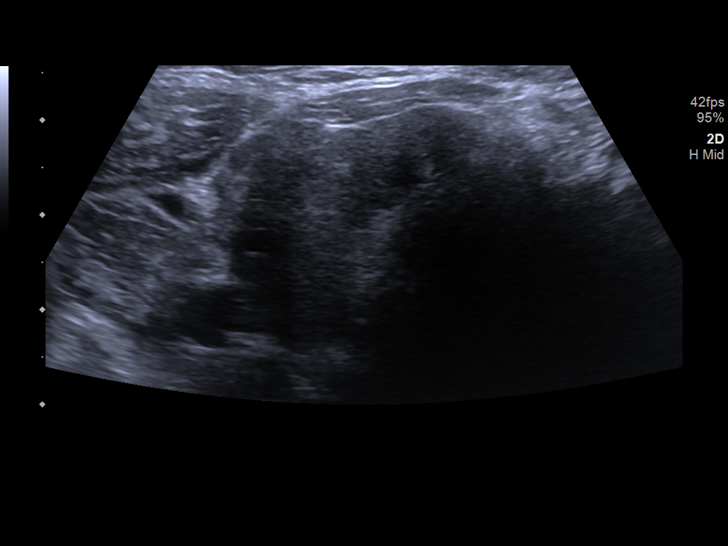
[im 14/54]
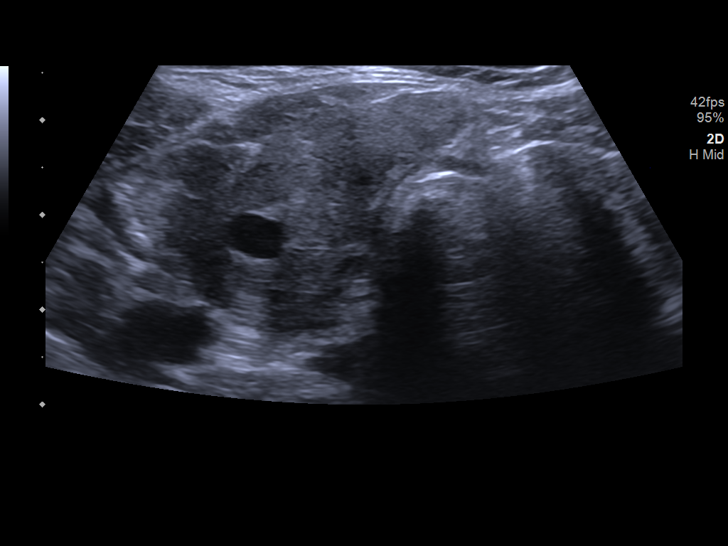
[im 18/54]
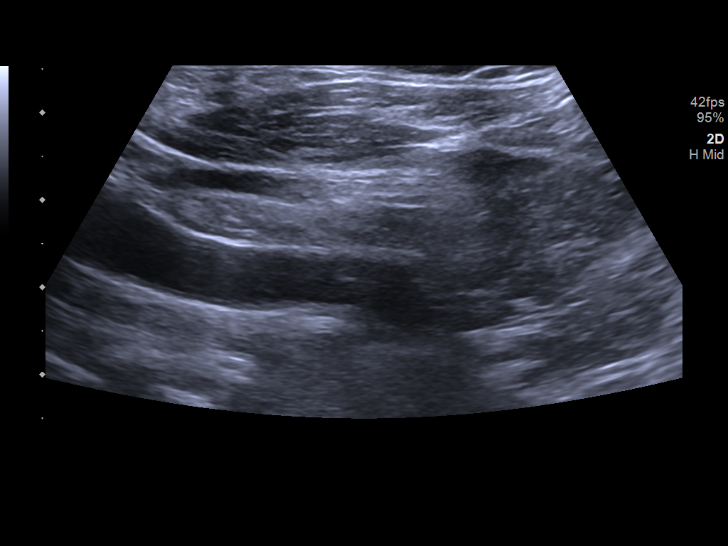
[im 23/54]
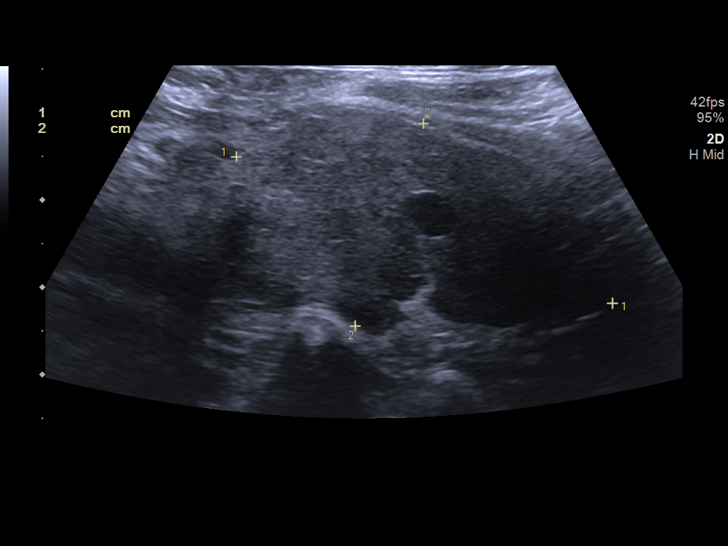
[im 27/54]
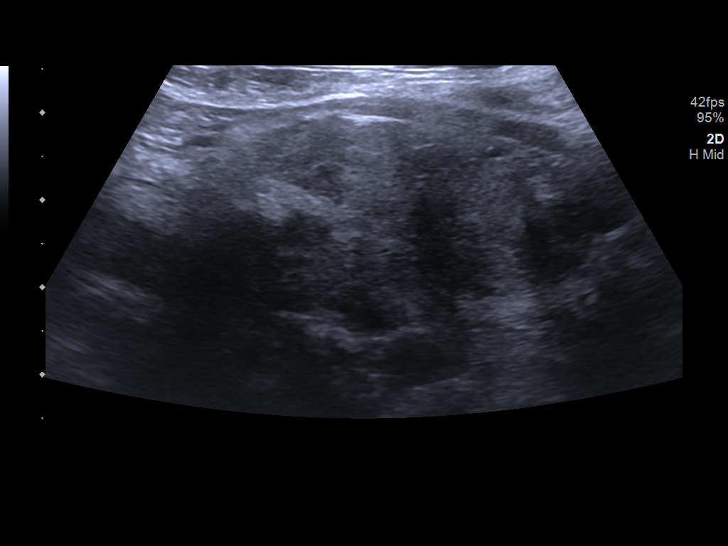
[im 31/54]
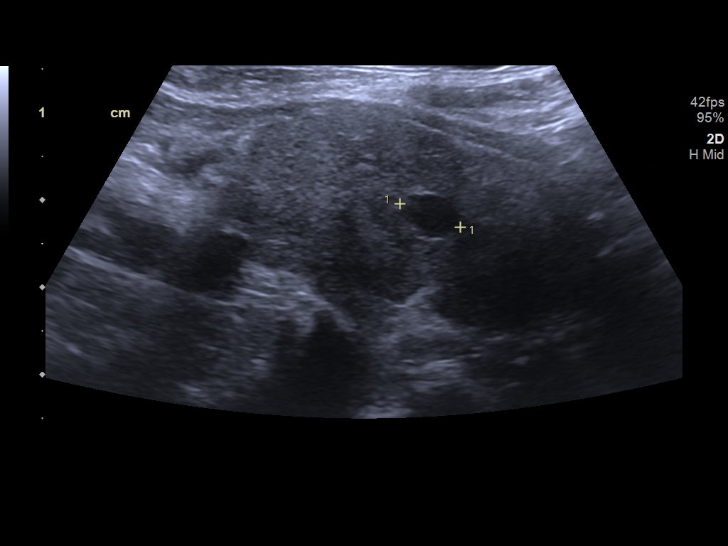
[im 36/54]
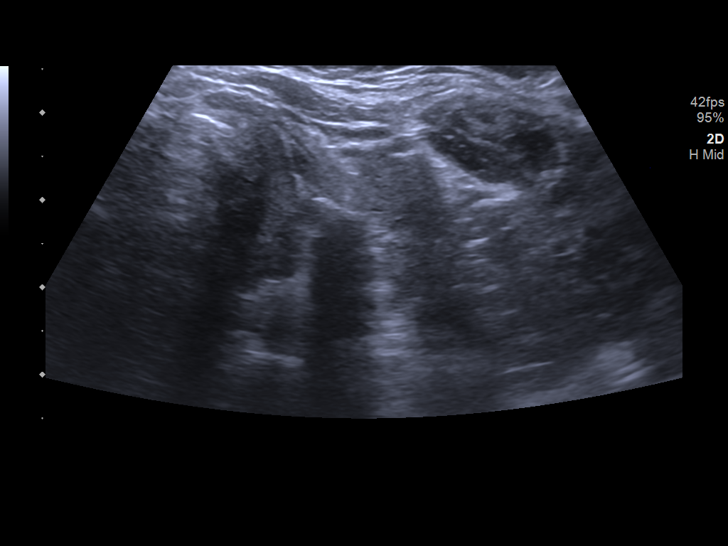
[im 40/54]
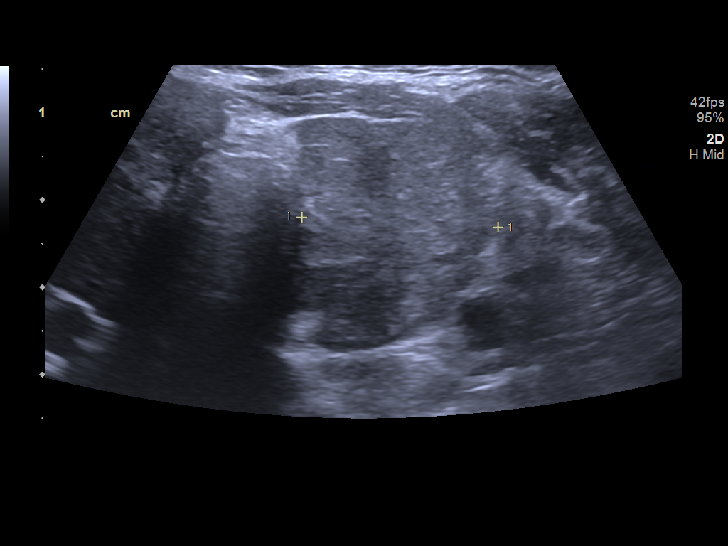
[im 45/54]
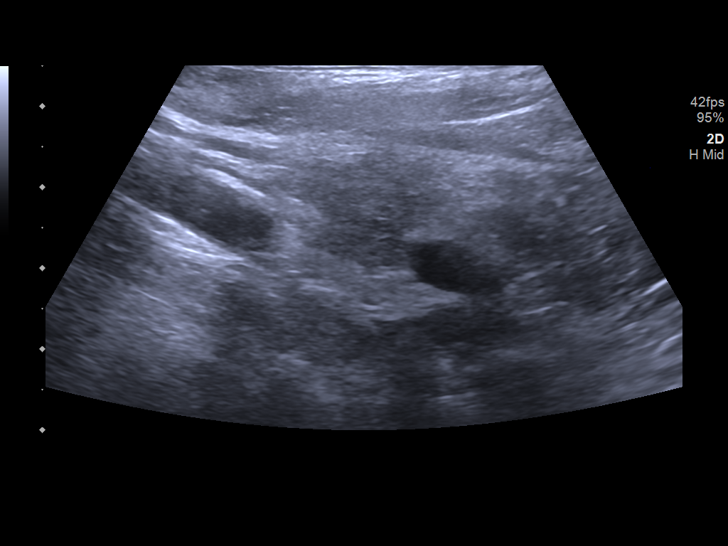
[im 49/54]
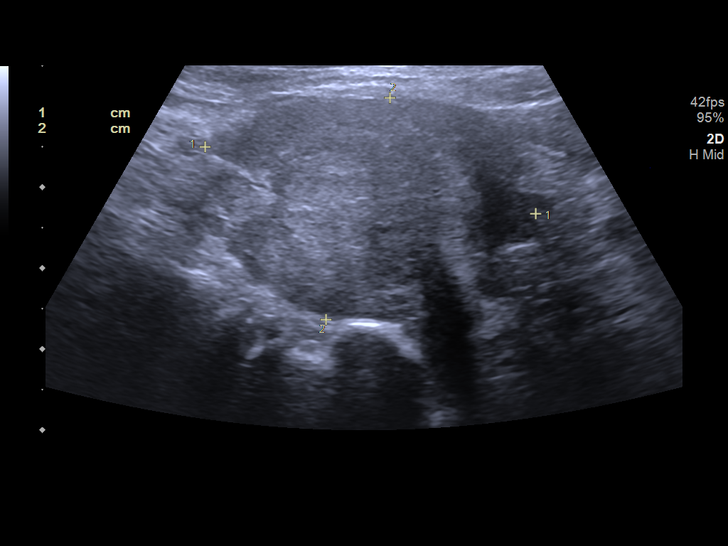
[im 54/54]
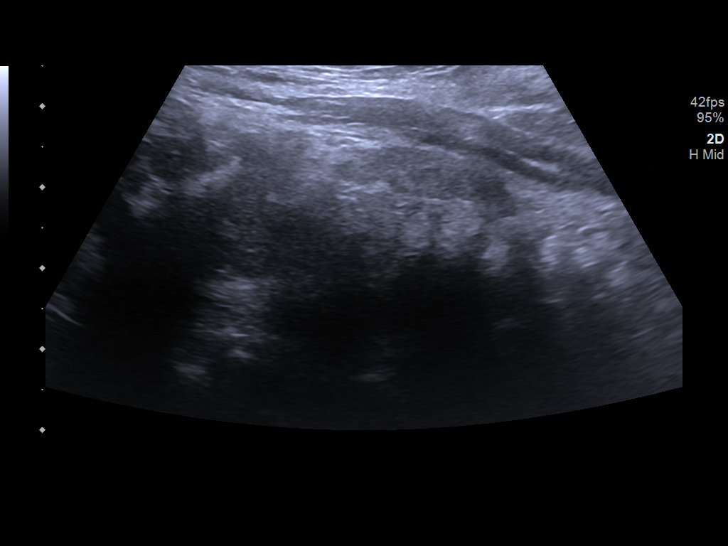

[13 of 25 positions shown; findings below may reference images not displayed]

FINDINGS: Parenchymal Echotexture: Markedly heterogenous

Isthmus: 0.8 cm

Right lobe: 4.6 x 2.5 x 2.8 cm

Left lobe: 4.2 x 2.9 x 2.3 cm

_________________________________________________________

Estimated total number of nodules >/= 1 cm: 0

Number of spongiform nodules >/=  2 cm not described below (TR1): 0

Number of mixed cystic and solid nodules >/= 1.5 cm not described
below (TR2): 0

_________________________________________________________

Diffusely enlarged and heterogeneous thyroid gland. Overall, the
thyroid gland measures slightly smaller than it did in 8622.
Incidental note is made of a tiny cyst in the right mid gland which
is considered incidental. No suspicious thyroid nodules.
IMPRESSION: Diffusely enlarged and heterogeneous thyroid gland. Despite overall
enlargement, the gland measures smaller than it did on remote prior
imaging from 8622.

No thyroid nodules.

The above is in keeping with the ACR TI-RADS recommendations - [HOSPITAL] 7031;[DATE].

## 2023-03-13 DIAGNOSIS — M1711 Unilateral primary osteoarthritis, right knee: Secondary | ICD-10-CM | POA: Diagnosis not present

## 2023-03-13 NOTE — Progress Notes (Signed)
Surgery orders requested via Epic inbox. °

## 2023-03-14 ENCOUNTER — Ambulatory Visit: Payer: Self-pay | Admitting: Emergency Medicine

## 2023-03-14 DIAGNOSIS — G8929 Other chronic pain: Secondary | ICD-10-CM

## 2023-03-14 NOTE — H&P (Signed)
TOTAL KNEE ADMISSION H&P  Patient is being admitted for right total knee arthroplasty.  Subjective:  Chief Complaint:right knee pain.  HPI: Joan Torres, 77 y.o. female, has a history of pain and functional disability in the right knee due to arthritis and initial traumatic fall resulting in blunt injury back in 1980  and has failed non-surgical conservative treatments for greater than 12 weeks to includeNSAID's and/or analgesics, corticosteriod injections, use of assistive devices, and activity modification.  Onset of symptoms was gradual, starting >10 years ago with gradually worsening course since that time especially over the last 5 years. The patient noted no past surgery on the right knee(s).  Patient currently rates pain in the right knee(s) at 9 out of 10 with activity. Patient has night pain, worsening of pain with activity and weight bearing, pain that interferes with activities of daily living, and pain with passive range of motion.  Patient has evidence of periarticular osteophytes and joint space narrowing by imaging studies. There is no active infection.  Patient Active Problem List   Diagnosis Date Noted   Spondylolisthesis at L4-L5 level 01/20/2018   ARTHRITIS, RIGHT KNEE 11/01/2009   CARPAL TUNNEL SYNDROME 11/09/2007   DEGENERATIVE JOINT DISEASE, LEFT KNEE 06/29/2007   KNEE PAIN 06/29/2007   DIABETES 06/26/2007   HIGH BLOOD PRESSURE 06/26/2007   Past Medical History:  Diagnosis Date   Diabetes mellitus without complication (HCC)    Hypertension    Hypothyroidism     Past Surgical History:  Procedure Laterality Date   ABDOMINAL HYSTERECTOMY     APPENDECTOMY     BREAST SURGERY     CYST REMOVAL NECK N/A 2018   HAND SURGERY      Current Outpatient Medications  Medication Sig Dispense Refill Last Dose   aspirin EC 81 MG tablet Take 1 tablet (81 mg total) by mouth daily at 6 PM. 1700 30 tablet 0    atorvastatin (LIPITOR) 40 MG tablet Take 40 mg by mouth daily.       Camphor-Eucalyptus-Menthol (VICKS VAPORUB EX) Place 1 application into the nose 3 (three) times daily as needed (for nasal congestion.).      CARTIA XT 180 MG 24 hr capsule Take 180 mg by mouth daily.      cetirizine (ZYRTEC) 10 MG tablet Take 10 mg by mouth daily.      chlorthalidone (HYGROTON) 25 MG tablet Take 25 mg by mouth daily.      Colchicine 0.6 MG CAPS Take 1 capsule by mouth daily as needed (gout flare ups).      ezetimibe (ZETIA) 10 MG tablet Take 10 mg by mouth daily.      furosemide (LASIX) 40 MG tablet Take 40 mg by mouth daily as needed (for fluid retention.).      gabapentin (NEURONTIN) 600 MG tablet Take 600 mg by mouth daily as needed (shingles).      glipiZIDE (GLUCOTROL) 10 MG tablet Take 10 mg by mouth 2 (two) times daily.      hydrOXYzine (ATARAX/VISTARIL) 25 MG tablet Take 1-2 tablets (25-50 mg total) by mouth every 4 (four) hours as needed for itching. (Patient not taking: Reported on 11/29/2021) 30 tablet 0    ipratropium (ATROVENT) 0.03 % nasal spray Place 1-2 sprays into both nostrils 2 (two) times daily as needed (for allergies.).       levothyroxine (SYNTHROID) 112 MCG tablet Take 112 mcg by mouth daily.      methocarbamol (ROBAXIN) 500 MG tablet Take 1 tablet (500  mg total) by mouth every 6 (six) hours as needed for muscle spasms. (Patient not taking: Reported on 11/29/2021) 60 tablet 2    omeprazole (PRILOSEC) 20 MG capsule Take 1 capsule (20 mg total) by mouth daily. 30 capsule 0    potassium chloride SA (KLOR-CON M) 20 MEQ tablet Take 20 mEq by mouth 2 (two) times daily.      Semaglutide,0.25 or 0.5MG /DOS, (OZEMPIC, 0.25 OR 0.5 MG/DOSE,) 2 MG/1.5ML SOPN Inject into the skin.      No current facility-administered medications for this visit.   No Known Allergies  Social History   Tobacco Use   Smoking status: Former   Smokeless tobacco: Never  Substance Use Topics   Alcohol use: No    No family history on file.   Review of Systems  Musculoskeletal:   Positive for arthralgias.  All other systems reviewed and are negative.   Objective:  Physical Exam Constitutional:      General: She is not in acute distress.    Appearance: Normal appearance. She is not ill-appearing.  HENT:     Head: Normocephalic and atraumatic.     Right Ear: External ear normal.     Left Ear: External ear normal.     Nose: Nose normal.     Mouth/Throat:     Mouth: Mucous membranes are moist.     Pharynx: Oropharynx is clear.  Eyes:     Extraocular Movements: Extraocular movements intact.     Conjunctiva/sclera: Conjunctivae normal.  Cardiovascular:     Rate and Rhythm: Normal rate and regular rhythm.     Pulses: Normal pulses.     Heart sounds: Normal heart sounds.  Pulmonary:     Effort: Pulmonary effort is normal.     Breath sounds: Normal breath sounds.  Abdominal:     General: Bowel sounds are normal.     Palpations: Abdomen is soft.     Tenderness: There is no abdominal tenderness.  Musculoskeletal:        General: Tenderness present.     Cervical back: Normal range of motion and neck supple.     Comments: TTP over medial and lateral joint line.  No calf tenderness, swelling, or erythema.  No overlying lesions of area of chief complaint.  Decreased strength and ROM due to elicited pain.  Pre-operative ROM 0-95.  Dorsiflexion and plantarflexion intact.  Stable to varus and valgus stress.  BLE appear grossly neurovascularly intact.  Gait mildly antalgic.   Skin:    General: Skin is warm and dry.  Neurological:     Mental Status: She is alert and oriented to person, place, and time. Mental status is at baseline.  Psychiatric:        Mood and Affect: Mood normal.        Behavior: Behavior normal.     Vital signs in last 24 hours: @VSRANGES @  Labs:   Estimated body mass index is 42.27 kg/m as calculated from the following:   Height as of 11/29/21: 5\' 5"  (1.651 m).   Weight as of 11/29/21: 115.2 kg.   Imaging Review Plain radiographs  demonstrate severe degenerative joint disease of the right knee(s). The overall alignment is moderate varus . The bone quality appears to be fair for age and reported activity level.      Assessment/Plan:  End stage arthritis, right knee   The patient history, physical examination, clinical judgment of the provider and imaging studies are consistent with end stage degenerative joint disease of  the right knee(s) and total knee arthroplasty is deemed medically necessary. The treatment options including medical management, injection therapy arthroscopy and arthroplasty were discussed at length. The risks and benefits of total knee arthroplasty were presented and reviewed. The risks due to aseptic loosening, infection, stiffness, patella tracking problems, thromboembolic complications and other imponderables were discussed. The patient acknowledged the explanation, agreed to proceed with the plan and consent was signed. Patient is being admitted for inpatient treatment for surgery, pain control, PT, OT, prophylactic antibiotics, VTE prophylaxis, progressive ambulation and ADL's and discharge planning. The patient is planning to be discharged home with outpatient PT.    Anticipated LOS equal to or greater than 2 midnights due to - Age 24 and older with one or more of the following:  - Obesity  - Expected need for hospital services (PT, OT, Nursing) required for safe  discharge  - Anticipated need for postoperative skilled nursing care or inpatient rehab  - Active co-morbidities: Geriatric, HTN, DM, HLD, CKD stage 4, hypothyroidism, gout, hemorrhoids OR   - Unanticipated findings during/Post Surgery: None  - Patient is a high risk of re-admission due to: None

## 2023-03-14 NOTE — H&P (View-Only) (Signed)
TOTAL KNEE ADMISSION H&P  Patient is being admitted for right total knee arthroplasty.  Subjective:  Chief Complaint:right knee pain.  HPI: Joan Torres, 77 y.o. female, has a history of pain and functional disability in the right knee due to arthritis and initial traumatic fall resulting in blunt injury back in 1980  and has failed non-surgical conservative treatments for greater than 12 weeks to includeNSAID's and/or analgesics, corticosteriod injections, use of assistive devices, and activity modification.  Onset of symptoms was gradual, starting >10 years ago with gradually worsening course since that time especially over the last 5 years. The patient noted no past surgery on the right knee(s).  Patient currently rates pain in the right knee(s) at 9 out of 10 with activity. Patient has night pain, worsening of pain with activity and weight bearing, pain that interferes with activities of daily living, and pain with passive range of motion.  Patient has evidence of periarticular osteophytes and joint space narrowing by imaging studies. There is no active infection.  Patient Active Problem List   Diagnosis Date Noted   Spondylolisthesis at L4-L5 level 01/20/2018   ARTHRITIS, RIGHT KNEE 11/01/2009   CARPAL TUNNEL SYNDROME 11/09/2007   DEGENERATIVE JOINT DISEASE, LEFT KNEE 06/29/2007   KNEE PAIN 06/29/2007   DIABETES 06/26/2007   HIGH BLOOD PRESSURE 06/26/2007   Past Medical History:  Diagnosis Date   Diabetes mellitus without complication (HCC)    Hypertension    Hypothyroidism     Past Surgical History:  Procedure Laterality Date   ABDOMINAL HYSTERECTOMY     APPENDECTOMY     BREAST SURGERY     CYST REMOVAL NECK N/A 2018   HAND SURGERY      Current Outpatient Medications  Medication Sig Dispense Refill Last Dose   aspirin EC 81 MG tablet Take 1 tablet (81 mg total) by mouth daily at 6 PM. 1700 30 tablet 0    atorvastatin (LIPITOR) 40 MG tablet Take 40 mg by mouth daily.       Camphor-Eucalyptus-Menthol (VICKS VAPORUB EX) Place 1 application into the nose 3 (three) times daily as needed (for nasal congestion.).      CARTIA XT 180 MG 24 hr capsule Take 180 mg by mouth daily.      cetirizine (ZYRTEC) 10 MG tablet Take 10 mg by mouth daily.      chlorthalidone (HYGROTON) 25 MG tablet Take 25 mg by mouth daily.      Colchicine 0.6 MG CAPS Take 1 capsule by mouth daily as needed (gout flare ups).      ezetimibe (ZETIA) 10 MG tablet Take 10 mg by mouth daily.      furosemide (LASIX) 40 MG tablet Take 40 mg by mouth daily as needed (for fluid retention.).      gabapentin (NEURONTIN) 600 MG tablet Take 600 mg by mouth daily as needed (shingles).      glipiZIDE (GLUCOTROL) 10 MG tablet Take 10 mg by mouth 2 (two) times daily.      hydrOXYzine (ATARAX/VISTARIL) 25 MG tablet Take 1-2 tablets (25-50 mg total) by mouth every 4 (four) hours as needed for itching. (Patient not taking: Reported on 11/29/2021) 30 tablet 0    ipratropium (ATROVENT) 0.03 % nasal spray Place 1-2 sprays into both nostrils 2 (two) times daily as needed (for allergies.).       levothyroxine (SYNTHROID) 112 MCG tablet Take 112 mcg by mouth daily.      methocarbamol (ROBAXIN) 500 MG tablet Take 1 tablet (500  mg total) by mouth every 6 (six) hours as needed for muscle spasms. (Patient not taking: Reported on 11/29/2021) 60 tablet 2    omeprazole (PRILOSEC) 20 MG capsule Take 1 capsule (20 mg total) by mouth daily. 30 capsule 0    potassium chloride SA (KLOR-CON M) 20 MEQ tablet Take 20 mEq by mouth 2 (two) times daily.      Semaglutide,0.25 or 0.5MG /DOS, (OZEMPIC, 0.25 OR 0.5 MG/DOSE,) 2 MG/1.5ML SOPN Inject into the skin.      No current facility-administered medications for this visit.   No Known Allergies  Social History   Tobacco Use   Smoking status: Former   Smokeless tobacco: Never  Substance Use Topics   Alcohol use: No    No family history on file.   Review of Systems  Musculoskeletal:   Positive for arthralgias.  All other systems reviewed and are negative.   Objective:  Physical Exam Constitutional:      General: She is not in acute distress.    Appearance: Normal appearance. She is not ill-appearing.  HENT:     Head: Normocephalic and atraumatic.     Right Ear: External ear normal.     Left Ear: External ear normal.     Nose: Nose normal.     Mouth/Throat:     Mouth: Mucous membranes are moist.     Pharynx: Oropharynx is clear.  Eyes:     Extraocular Movements: Extraocular movements intact.     Conjunctiva/sclera: Conjunctivae normal.  Cardiovascular:     Rate and Rhythm: Normal rate and regular rhythm.     Pulses: Normal pulses.     Heart sounds: Normal heart sounds.  Pulmonary:     Effort: Pulmonary effort is normal.     Breath sounds: Normal breath sounds.  Abdominal:     General: Bowel sounds are normal.     Palpations: Abdomen is soft.     Tenderness: There is no abdominal tenderness.  Musculoskeletal:        General: Tenderness present.     Cervical back: Normal range of motion and neck supple.     Comments: TTP over medial and lateral joint line.  No calf tenderness, swelling, or erythema.  No overlying lesions of area of chief complaint.  Decreased strength and ROM due to elicited pain.  Pre-operative ROM 0-95.  Dorsiflexion and plantarflexion intact.  Stable to varus and valgus stress.  BLE appear grossly neurovascularly intact.  Gait mildly antalgic.   Skin:    General: Skin is warm and dry.  Neurological:     Mental Status: She is alert and oriented to person, place, and time. Mental status is at baseline.  Psychiatric:        Mood and Affect: Mood normal.        Behavior: Behavior normal.     Vital signs in last 24 hours: @VSRANGES @  Labs:   Estimated body mass index is 42.27 kg/m as calculated from the following:   Height as of 11/29/21: 5\' 5"  (1.651 m).   Weight as of 11/29/21: 115.2 kg.   Imaging Review Plain radiographs  demonstrate severe degenerative joint disease of the right knee(s). The overall alignment is moderate varus . The bone quality appears to be fair for age and reported activity level.      Assessment/Plan:  End stage arthritis, right knee   The patient history, physical examination, clinical judgment of the provider and imaging studies are consistent with end stage degenerative joint disease of  the right knee(s) and total knee arthroplasty is deemed medically necessary. The treatment options including medical management, injection therapy arthroscopy and arthroplasty were discussed at length. The risks and benefits of total knee arthroplasty were presented and reviewed. The risks due to aseptic loosening, infection, stiffness, patella tracking problems, thromboembolic complications and other imponderables were discussed. The patient acknowledged the explanation, agreed to proceed with the plan and consent was signed. Patient is being admitted for inpatient treatment for surgery, pain control, PT, OT, prophylactic antibiotics, VTE prophylaxis, progressive ambulation and ADL's and discharge planning. The patient is planning to be discharged home with outpatient PT.    Anticipated LOS equal to or greater than 2 midnights due to - Age 25 and older with one or more of the following:  - Obesity  - Expected need for hospital services (PT, OT, Nursing) required for safe  discharge  - Anticipated need for postoperative skilled nursing care or inpatient rehab  - Active co-morbidities: Geriatric, HTN, DM, HLD, CKD stage 4, hypothyroidism, gout, hemorrhoids OR   - Unanticipated findings during/Post Surgery: None  - Patient is a high risk of re-admission due to: None

## 2023-03-20 DIAGNOSIS — E1165 Type 2 diabetes mellitus with hyperglycemia: Secondary | ICD-10-CM | POA: Diagnosis not present

## 2023-03-20 DIAGNOSIS — E1122 Type 2 diabetes mellitus with diabetic chronic kidney disease: Secondary | ICD-10-CM | POA: Diagnosis not present

## 2023-03-20 DIAGNOSIS — N189 Chronic kidney disease, unspecified: Secondary | ICD-10-CM | POA: Diagnosis not present

## 2023-03-20 NOTE — Patient Instructions (Signed)
DUE TO COVID-19 ONLY TWO VISITORS  (aged 78 and older)  ARE ALLOWED TO COME WITH YOU AND STAY IN THE WAITING ROOM ONLY DURING PRE OP AND PROCEDURE.   **NO VISITORS ARE ALLOWED IN THE SHORT STAY AREA OR RECOVERY ROOM!!**  IF YOU WILL BE ADMITTED INTO THE HOSPITAL YOU ARE ALLOWED ONLY FOUR SUPPORT PEOPLE DURING VISITATION HOURS ONLY (7 AM -8PM)   The support person(s) must pass our screening, gel in and out, and wear a mask at all times, including in the patient's room. Patients must also wear a mask when staff or their support person are in the room. Visitors GUEST BADGE MUST BE WORN VISIBLY  One adult visitor may remain with you overnight and MUST be in the room by 8 P.M.     Your procedure is scheduled on: 03/31/23   Report to [facility] Main Entrance    Report to admitting at : 11:00 AM   Call this number if you have problems the morning of surgery [phone]   Do not eat food :After Midnight.   After Midnight you may have the following liquids until : 10:30 AMDAY OF SURGERY  Water Black Coffee (sugar ok, NO MILK/CREAM OR CREAMERS)  Tea (sugar ok, NO MILK/CREAM OR CREAMERS) regular and decaf                             Plain Jell-O (NO RED)                                           Fruit ices (not with fruit pulp, NO RED)                                     Popsicles (NO RED)                                                                  Juice: apple, WHITE grape, WHITE cranberry Sports drinks like Gatorade (NO RED)   The day of surgery:  Drink ONE (1) Pre-Surgery Clear G2 at : 10:30 AM the morning of surgery. Drink in one sitting. Do not sip.  This drink was given to you during your hospital  pre-op appointment visit. Nothing else to drink after completing the  Pre-Surgery Clear Ensure or G2.          If you have questions, please contact your surgeon's office.  FOLLOW ANY ADDITIONAL PRE OP INSTRUCTIONS YOU RECEIVED FROM YOUR SURGEON'S OFFICE!!!   Oral  Hygiene is also important to reduce your risk of infection.                                    Remember - BRUSH YOUR TEETH THE MORNING OF SURGERY WITH YOUR REGULAR TOOTHPASTE  DENTURES WILL BE REMOVED PRIOR TO SURGERY PLEASE DO NOT APPLY "Poly grip" OR ADHESIVES!!!   Do NOT smoke after Midnight   Take these medicines the morning of surgery with  A SIP OF WATER:gabapentin,cetirizine,cautia XT,levothyroxine,omeprazole.Colchicine as needed.   How to Manage Your Diabetes Before and After Surgery  Why is it important to control my blood sugar before and after surgery? Improving blood sugar levels before and after surgery helps healing and can limit problems. A way of improving blood sugar control is eating a healthy diet by:  Eating less sugar and carbohydrates  Increasing activity/exercise  Talking with your doctor about reaching your blood sugar goals High blood sugars (greater than 180 mg/dL) can raise your risk of infections and slow your recovery, so you will need to focus on controlling your diabetes during the weeks before surgery. Make sure that the doctor who takes care of your diabetes knows about your planned surgery including the date and location.  How do I manage my blood sugar before surgery? Check your blood sugar at least 4 times a day, starting 2 days before surgery, to make sure that the level is not too high or low. Check your blood sugar the morning of your surgery when you wake up and every 2 hours until you get to the Short Stay unit. If your blood sugar is less than 70 mg/dL, you will need to treat for low blood sugar: Do not take insulin. Treat a low blood sugar (less than 70 mg/dL) with  cup of clear juice (cranberry or apple), 4 glucose tablets, OR glucose gel. Recheck blood sugar in 15 minutes after treatment (to make sure it is greater than 70 mg/dL). If your blood sugar is not greater than 70 mg/dL on recheck, call [phone] for further instructions. Report  your blood sugar to the short stay nurse when you get to Short Stay.  If you are admitted to the hospital after surgery: Your blood sugar will be checked by the staff and you will probably be given insulin after surgery (instead of oral diabetes medicines) to make sure you have good blood sugar levels. The goal for blood sugar control after surgery is 80-180 mg/dL.   WHAT DO I DO ABOUT MY DIABETES MEDICATION?  THE NIGHT BEFORE SURGERY, DO NOT take glipizide.      THE MORNING OF SURGERY,DO NOT TAKE ANY ORAL DIABETIC MEDICATIONS DAY OF YOUR SURGERY  DO NOT TAKE THE FOLLOWING 7 DAYS PRIOR TO SURGERY: Ozempic, Wegovy, Rybelsus (Semaglutide), Byetta (exenatide), Bydureon (exenatide ER), Victoza, Saxenda (liraglutide), or Trulicity (dulaglutide) Mounjaro (Tirzepatide) Adlyxin (Lixisenatide), Polyethylene Glycol Loxenatide. DO NOT TAKE SEMAGLUTIDE AFTER: 03/23/23  Bring CPAP mask and tubing day of surgery.                              You may not have any metal on your body including hair pins, jewelry, and body piercing             Do not wear make-up, lotions, powders, perfumes/cologne, or deodorant  Do not wear nail polish including gel and S&S, artificial/acrylic nails, or any other type of covering on natural nails including finger and toenails. If you have artificial nails, gel coating, etc. that needs to be removed by a nail salon please have this removed prior to surgery or surgery may need to be canceled/ delayed if the surgeon/ anesthesia feels like they are unable to be safely monitored.   Do not shave  48 hours prior to surgery.    Do not bring valuables to the hospital. [facility] IS NOT  RESPONSIBLE   FOR VALUABLES.   Contacts, glasses, or bridgework may not be worn into surgery.   Bring small overnight bag day of surgery.   DO NOT BRING YOUR HOME MEDICATIONS TO THE HOSPITAL. PHARMACY WILL DISPENSE MEDICATIONS LISTED ON YOUR MEDICATION LIST TO YOU DURING YOUR  ADMISSION IN THE HOSPITAL!    Patients discharged on the day of surgery will not be allowed to drive home.  Someone NEEDS to stay with you for the first 24 hours after anesthesia.   Special Instructions: Bring a copy of your healthcare power of attorney and living will documents         the day of surgery if you haven't scanned them before.              Please read over the following fact sheets you were given: IF YOU HAVE QUESTIONS ABOUT YOUR PRE-OP INSTRUCTIONS PLEASE CALL [phone]      Pre-operative 5 CHG Bath Instructions   You can play a key role in reducing the risk of infection after surgery. Your skin needs to be as free of germs as possible. You can reduce the number of germs on your skin by washing with CHG (chlorhexidine gluconate) soap before surgery. CHG is an antiseptic soap that kills germs and continues to kill germs even after washing.   DO NOT use if you have an allergy to chlorhexidine/CHG or antibacterial soaps. If your skin becomes reddened or irritated, stop using the CHG and notify one of our RNs at : [phone].   Please shower with the CHG soap starting 4 days before surgery using the following schedule:     Please keep in mind the following:  DO NOT shave, including legs and underarms, starting the day of your first shower.   You may shave your face at any point before/day of surgery.  Place clean sheets on your bed the day you start using CHG soap. Use a clean washcloth (not used since being washed) for each shower. DO NOT sleep with pets once you start using the CHG.   CHG Shower Instructions:  If you choose to wash your hair and private area, wash first with your normal shampoo/soap.  After you use shampoo/soap, rinse your hair and body thoroughly to remove shampoo/soap residue.  Turn the water OFF and apply about 3 tablespoons (45 ml) of CHG soap to a CLEAN washcloth.  Apply CHG soap ONLY FROM YOUR NECK DOWN TO YOUR TOES (washing for 3-5 minutes)   DO NOT use CHG soap on face, private areas, open wounds, or sores.  Pay special attention to the area where your surgery is being performed.  If you are having back surgery, having someone wash your back for you may be helpful. Wait 2 minutes after CHG soap is applied, then you may rinse off the CHG soap.  Pat dry with a clean towel  Put on clean clothes/pajamas   If you choose to wear lotion, please use ONLY the CHG-compatible lotions on the back of this paper.     Additional instructions for the day of surgery: DO NOT APPLY any lotions, deodorants, cologne, or perfumes.   Put on clean/comfortable clothes.  Brush your teeth.  Ask your nurse before applying any prescription medications to the skin.    CHG Compatible Lotions   Aveeno Moisturizing lotion  Cetaphil Moisturizing Cream  Cetaphil Moisturizing Lotion  Clairol Herbal Essence Moisturizing Lotion, Dry Skin  Clairol Herbal Essence Moisturizing Lotion, Extra Dry Skin  Clairol Herbal Essence Moisturizing Lotion, Normal Skin  Curel Age Defying Therapeutic Moisturizing Lotion with Alpha Hydroxy  Curel Extreme Care Body Lotion  Curel Soothing Hands Moisturizing Hand Lotion  Curel Therapeutic Moisturizing Cream, Fragrance-Free  Curel Therapeutic Moisturizing Lotion, Fragrance-Free  Curel Therapeutic Moisturizing Lotion, Original Formula  Eucerin Daily Replenishing Lotion  Eucerin Dry Skin Therapy Plus Alpha Hydroxy Crme  Eucerin Dry Skin Therapy Plus Alpha Hydroxy Lotion  Eucerin Original Crme  Eucerin Original Lotion  Eucerin Plus Crme Eucerin Plus Lotion  Eucerin TriLipid Replenishing Lotion  Keri Anti-Bacterial Hand Lotion  Keri Deep Conditioning Original Lotion Dry Skin Formula Softly Scented  Keri Deep Conditioning Original Lotion, Fragrance Free Sensitive Skin Formula  Keri Lotion Fast Absorbing Fragrance Free Sensitive Skin Formula  Keri Lotion Fast Absorbing Softly Scented Dry Skin Formula  Keri Original  Lotion  Keri Skin Renewal Lotion Keri Silky Smooth Lotion  Keri Silky Smooth Sensitive Skin Lotion  Nivea Body Creamy Conditioning Oil  Nivea Body Extra Enriched Lotion  Nivea Body Original Lotion  Nivea Body Sheer Moisturizing Lotion Nivea Crme  Nivea Skin Firming Lotion  NutraDerm 30 Skin Lotion  NutraDerm Skin Lotion  NutraDerm Therapeutic Skin Cream  NutraDerm Therapeutic Skin Lotion  ProShield Protective Hand Cream  Provon moisturizing lotion   Incentive Spirometer  An incentive spirometer is a tool that can help keep your lungs clear and active. This tool measures how well you are filling your lungs with each breath. Taking long deep breaths may help reverse or decrease the chance of developing breathing (pulmonary) problems (especially infection) following: A long period of time when you are unable to move or be active. BEFORE THE PROCEDURE  If the spirometer includes an indicator to show your best effort, your nurse or respiratory therapist will set it to a desired goal. If possible, sit up straight or lean slightly forward. Try not to slouch. Hold the incentive spirometer in an upright position. INSTRUCTIONS FOR USE  Sit on the edge of your bed if possible, or sit up as far as you can in bed or on a chair. Hold the incentive spirometer in an upright position. Breathe out normally. Place the mouthpiece in your mouth and seal your lips tightly around it. Breathe in slowly and as deeply as possible, raising the piston or the ball toward the top of the column. Hold your breath for 3-5 seconds or for as long as possible. Allow the piston or ball to fall to the bottom of the column. Remove the mouthpiece from your mouth and breathe out normally. Rest for a few seconds and repeat Steps 1 through 7 at least 10 times every 1-2 hours when you are awake. Take your time and take a few normal breaths between deep breaths. The spirometer may include an indicator to show your best effort.  Use the indicator as a goal to work toward during each repetition. After each set of 10 deep breaths, practice coughing to be sure your lungs are clear. If you have an incision (the cut made at the time of surgery), support your incision when coughing by placing a pillow or rolled up towels firmly against it. Once you are able to get out of bed, walk around indoors and cough well. You may stop using the incentive spirometer when instructed by your caregiver.  RISKS AND COMPLICATIONS Take your time so you do not get dizzy or light-headed. If you are in pain, you may need to take or ask for pain medication before doing  incentive spirometry. It is harder to take a deep breath if you are having pain. AFTER USE Rest and breathe slowly and easily. It can be helpful to keep track of a log of your progress. Your caregiver can provide you with a simple table to help with this. If you are using the spirometer at home, follow these instructions: SEEK MEDICAL CARE IF:  You are having difficultly using the spirometer. You have trouble using the spirometer as often as instructed. Your pain medication is not giving enough relief while using the spirometer. You develop fever of 100.5 F (38.1 C) or higher. SEEK IMMEDIATE MEDICAL CARE IF:  You cough up bloody sputum that had not been present before. You develop fever of 102 F (38.9 C) or greater. You develop worsening pain at or near the incision site. MAKE SURE YOU:  Understand these instructions. Will watch your condition. Will get help right away if you are not doing well or get worse. Document Released: 11/11/2006 Document Revised: 09/23/2011 Document Reviewed: 01/12/2007 [facility] Patient Information 2014 [place], [place].   ________________________________________________________________________

## 2023-03-21 ENCOUNTER — Encounter (HOSPITAL_COMMUNITY)
Admission: RE | Admit: 2023-03-21 | Discharge: 2023-03-21 | Disposition: A | Discharge status: Home / Self Care | Payer: Medicare HMO | Source: Ambulatory Visit | Attending: Orthopedic Surgery | Admitting: Orthopedic Surgery

## 2023-03-21 ENCOUNTER — Other Ambulatory Visit: Payer: Self-pay

## 2023-03-21 ENCOUNTER — Encounter (HOSPITAL_COMMUNITY): Payer: Self-pay

## 2023-03-21 VITALS — BP 132/76 | HR 79 | Temp 98.4°F | Ht 65.0 in | Wt 217.0 lb

## 2023-03-21 DIAGNOSIS — G8929 Other chronic pain: Secondary | ICD-10-CM | POA: Diagnosis not present

## 2023-03-21 DIAGNOSIS — E119 Type 2 diabetes mellitus without complications: Secondary | ICD-10-CM | POA: Insufficient documentation

## 2023-03-21 DIAGNOSIS — M25561 Pain in right knee: Secondary | ICD-10-CM | POA: Diagnosis not present

## 2023-03-21 DIAGNOSIS — Z794 Long term (current) use of insulin: Secondary | ICD-10-CM | POA: Diagnosis not present

## 2023-03-21 DIAGNOSIS — Z01818 Encounter for other preprocedural examination: Secondary | ICD-10-CM | POA: Diagnosis not present

## 2023-03-21 HISTORY — DX: Chronic kidney disease, unspecified: N18.9

## 2023-03-21 HISTORY — DX: Unspecified osteoarthritis, unspecified site: M19.90

## 2023-03-21 LAB — CBC WITH DIFFERENTIAL/PLATELET
Abs Immature Granulocytes: 0.01 10*3/uL (ref 0.00–0.07)
Basophils Absolute: 0.1 10*3/uL (ref 0.0–0.1)
Basophils Relative: 1 %
Eosinophils Absolute: 0.1 10*3/uL (ref 0.0–0.5)
Eosinophils Relative: 2 %
HCT: 39.6 % (ref 36.0–46.0)
Hemoglobin: 12.6 g/dL (ref 12.0–15.0)
Immature Granulocytes: 0 %
Lymphocytes Relative: 36 %
Lymphs Abs: 2.7 10*3/uL (ref 0.7–4.0)
MCH: 29.9 pg (ref 26.0–34.0)
MCHC: 31.8 g/dL (ref 30.0–36.0)
MCV: 93.8 fL (ref 80.0–100.0)
Monocytes Absolute: 0.8 10*3/uL (ref 0.1–1.0)
Monocytes Relative: 10 %
Neutro Abs: 3.8 10*3/uL (ref 1.7–7.7)
Neutrophils Relative %: 51 %
Platelets: 292 10*3/uL (ref 150–400)
RBC: 4.22 MIL/uL (ref 3.87–5.11)
RDW: 14.6 % (ref 11.5–15.5)
WBC: 7.5 10*3/uL (ref 4.0–10.5)
nRBC: 0 % (ref 0.0–0.2)

## 2023-03-21 LAB — COMPREHENSIVE METABOLIC PANEL
ALT: 17 U/L (ref 0–44)
AST: 19 U/L (ref 15–41)
Albumin: 4 g/dL (ref 3.5–5.0)
Alkaline Phosphatase: 91 U/L (ref 38–126)
Anion gap: 11 (ref 5–15)
BUN: 14 mg/dL (ref 8–23)
CO2: 21 mmol/L — ABNORMAL LOW (ref 22–32)
Calcium: 10.3 mg/dL (ref 8.9–10.3)
Chloride: 106 mmol/L (ref 98–111)
Creatinine, Ser: 1.58 mg/dL — ABNORMAL HIGH (ref 0.44–1.00)
GFR, Estimated: 33 mL/min — ABNORMAL LOW (ref >60)
Glucose, Bld: 107 mg/dL — ABNORMAL HIGH (ref 70–99)
Potassium: 4.5 mmol/L (ref 3.5–5.1)
Sodium: 138 mmol/L (ref 135–145)
Total Bilirubin: 0.5 mg/dL (ref 0.3–1.2)
Total Protein: 7.2 g/dL (ref 6.5–8.1)

## 2023-03-21 LAB — GLUCOSE, CAPILLARY: Glucose-Capillary: 111 mg/dL — ABNORMAL HIGH (ref 70–99)

## 2023-03-21 LAB — SURGICAL PCR SCREEN
MRSA, PCR: NEGATIVE
Staphylococcus aureus: NEGATIVE

## 2023-03-21 NOTE — Progress Notes (Addendum)
For Short Stay: COVID SWAB appointment date:  Bowel Prep reminder: N/A   For Anesthesia: PCP - Juliette Alcide, MD Cardiologist - N/A  Chest x-ray -  EKG - 03/21/23 Stress Test -  ECHO -  Cardiac Cath -  Pacemaker/ICD device last checked: Pacemaker orders received: Device Rep notified:  Spinal Cord Stimulator: N/A  Sleep Study - Yes CPAP - NO  Fasting Blood Sugar - 120's Checks Blood Sugar __2___ times a day Date and result of last Hgb A1c-  Last dose of GLP1 agonist- Semaglutide GLP1 instructions: To hold it after: 9/824  Last dose of SGLT-2 inhibitors- N/A SGLT-2 instructions:   Blood Thinner Instructions: Aspirin Instructions: Will be hold 7 days before surgery. Last Dose:  Activity level: Can go up a flight of stairs and activities of daily living without stopping and without chest pain and/or shortness of breath   Unble to exercise due to knee pain. Anesthesia review: Hx: HTN,DIA.  Patient denies shortness of breath, fever, cough and chest pain at PAT appointment   Patient verbalized understanding of instructions that were given to them at the PAT appointment. Patient was also instructed that they will need to review over the PAT instructions again at home before surgery.

## 2023-03-21 NOTE — Care Plan (Addendum)
Ortho Bundle Case Management Note  Patient Details  Name: Joan Torres MRN: [id number] Date of Birth: Mar 19, 1946  presented for appointment in a W/C. states the pain is the issue. has not had any falls. uses a cane in the community. will discharge to home with SO. will go to his home - [address], [place], [place] [address]. rolling walker and CPM ordered for home. HHPT referral to [facility] [phone]) and OPPT set up with [facility]. discharge instructions discussed and questions answered. SO was here but declined coming to the exam room for education due to his knee pain.                 * [facility] is not in Network - HHPT referral sent to [facility] -  [phone]. They will see her at discharge.   DME Arranged:  Dan Humphreys rolling, CPM DME Agency:  [employer]  HH Arranged:  PT HH Agency:  [facility]  Additional Comments: Please contact me with any questions of if this plan should need to change.  Shauna Hugh,  RN,BSN,MHA,CCM  [facility] Orthopaedic Specialist  [phone] 04/01/2023, 8:18 AM

## 2023-03-22 LAB — HEMOGLOBIN A1C
Hgb A1c MFr Bld: 6.9 % — ABNORMAL HIGH (ref 4.8–5.6)
Mean Plasma Glucose: 151.33 mg/dL

## 2023-03-26 DIAGNOSIS — Z6835 Body mass index (BMI) 35.0-35.9, adult: Secondary | ICD-10-CM | POA: Diagnosis not present

## 2023-03-26 DIAGNOSIS — M1711 Unilateral primary osteoarthritis, right knee: Secondary | ICD-10-CM | POA: Diagnosis not present

## 2023-03-26 DIAGNOSIS — L259 Unspecified contact dermatitis, unspecified cause: Secondary | ICD-10-CM | POA: Diagnosis not present

## 2023-03-26 DIAGNOSIS — E039 Hypothyroidism, unspecified: Secondary | ICD-10-CM | POA: Diagnosis not present

## 2023-03-26 DIAGNOSIS — E1122 Type 2 diabetes mellitus with diabetic chronic kidney disease: Secondary | ICD-10-CM | POA: Diagnosis not present

## 2023-03-26 DIAGNOSIS — E7849 Other hyperlipidemia: Secondary | ICD-10-CM | POA: Diagnosis not present

## 2023-03-26 DIAGNOSIS — N184 Chronic kidney disease, stage 4 (severe): Secondary | ICD-10-CM | POA: Diagnosis not present

## 2023-03-26 DIAGNOSIS — I10 Essential (primary) hypertension: Secondary | ICD-10-CM | POA: Diagnosis not present

## 2023-03-31 ENCOUNTER — Other Ambulatory Visit: Payer: Self-pay

## 2023-03-31 ENCOUNTER — Encounter (HOSPITAL_COMMUNITY)
Admission: RE | Disposition: A | Discharge status: Home Health | Payer: Self-pay | Source: Home / Self Care | Attending: Orthopedic Surgery

## 2023-03-31 ENCOUNTER — Ambulatory Visit (HOSPITAL_COMMUNITY): Payer: Medicare HMO | Admitting: Registered Nurse

## 2023-03-31 ENCOUNTER — Inpatient Hospital Stay (HOSPITAL_COMMUNITY)
Admission: RE | Admit: 2023-03-31 | Discharge: 2023-04-05 | DRG: 470 | Disposition: A | Discharge status: Home Health | Payer: Medicare HMO | Attending: Orthopedic Surgery | Admitting: Orthopedic Surgery

## 2023-03-31 ENCOUNTER — Encounter (HOSPITAL_COMMUNITY): Payer: Self-pay | Admitting: Orthopedic Surgery

## 2023-03-31 ENCOUNTER — Ambulatory Visit (HOSPITAL_COMMUNITY): Payer: Medicare HMO | Admitting: Physician Assistant

## 2023-03-31 ENCOUNTER — Observation Stay (HOSPITAL_COMMUNITY): Payer: Medicare HMO

## 2023-03-31 DIAGNOSIS — I10 Essential (primary) hypertension: Secondary | ICD-10-CM | POA: Diagnosis not present

## 2023-03-31 DIAGNOSIS — I129 Hypertensive chronic kidney disease with stage 1 through stage 4 chronic kidney disease, or unspecified chronic kidney disease: Secondary | ICD-10-CM | POA: Diagnosis present

## 2023-03-31 DIAGNOSIS — Z96651 Presence of right artificial knee joint: Secondary | ICD-10-CM | POA: Diagnosis not present

## 2023-03-31 DIAGNOSIS — Z79899 Other long term (current) drug therapy: Secondary | ICD-10-CM | POA: Diagnosis not present

## 2023-03-31 DIAGNOSIS — Z23 Encounter for immunization: Secondary | ICD-10-CM | POA: Diagnosis present

## 2023-03-31 DIAGNOSIS — M1711 Unilateral primary osteoarthritis, right knee: Secondary | ICD-10-CM | POA: Diagnosis present

## 2023-03-31 DIAGNOSIS — Z79890 Hormone replacement therapy (postmenopausal): Secondary | ICD-10-CM

## 2023-03-31 DIAGNOSIS — Y838 Other surgical procedures as the cause of abnormal reaction of the patient, or of later complication, without mention of misadventure at the time of the procedure: Secondary | ICD-10-CM | POA: Diagnosis not present

## 2023-03-31 DIAGNOSIS — E039 Hypothyroidism, unspecified: Secondary | ICD-10-CM | POA: Diagnosis present

## 2023-03-31 DIAGNOSIS — G8918 Other acute postprocedural pain: Secondary | ICD-10-CM | POA: Diagnosis not present

## 2023-03-31 DIAGNOSIS — Z87891 Personal history of nicotine dependence: Secondary | ICD-10-CM

## 2023-03-31 DIAGNOSIS — E1122 Type 2 diabetes mellitus with diabetic chronic kidney disease: Secondary | ICD-10-CM | POA: Diagnosis present

## 2023-03-31 DIAGNOSIS — I97191 Other postprocedural cardiac functional disturbances following other surgery: Secondary | ICD-10-CM | POA: Diagnosis not present

## 2023-03-31 DIAGNOSIS — Z7982 Long term (current) use of aspirin: Secondary | ICD-10-CM | POA: Diagnosis not present

## 2023-03-31 DIAGNOSIS — Z7984 Long term (current) use of oral hypoglycemic drugs: Secondary | ICD-10-CM | POA: Diagnosis not present

## 2023-03-31 DIAGNOSIS — Y792 Prosthetic and other implants, materials and accessory orthopedic devices associated with adverse incidents: Secondary | ICD-10-CM | POA: Diagnosis not present

## 2023-03-31 DIAGNOSIS — Z471 Aftercare following joint replacement surgery: Secondary | ICD-10-CM | POA: Diagnosis not present

## 2023-03-31 DIAGNOSIS — I441 Atrioventricular block, second degree: Secondary | ICD-10-CM | POA: Diagnosis not present

## 2023-03-31 DIAGNOSIS — N1832 Chronic kidney disease, stage 3b: Secondary | ICD-10-CM | POA: Diagnosis present

## 2023-03-31 HISTORY — PX: TOTAL KNEE ARTHROPLASTY: SHX125

## 2023-03-31 LAB — GLUCOSE, CAPILLARY
Glucose-Capillary: 118 mg/dL — ABNORMAL HIGH (ref 70–99)
Glucose-Capillary: 123 mg/dL — ABNORMAL HIGH (ref 70–99)
Glucose-Capillary: 115 mg/dL — ABNORMAL HIGH (ref 70–99)
Glucose-Capillary: 157 mg/dL — ABNORMAL HIGH (ref 70–99)
Glucose-Capillary: 253 mg/dL — ABNORMAL HIGH (ref 70–99)

## 2023-03-31 LAB — TYPE AND SCREEN
ABO/RH(D): O POS
Antibody Screen: NEGATIVE

## 2023-03-31 SURGERY — ARTHROPLASTY, KNEE, TOTAL
Anesthesia: Monitor Anesthesia Care | Site: Knee | Laterality: Right

## 2023-03-31 MED ORDER — 0.9 % SODIUM CHLORIDE (POUR BTL) OPTIME
TOPICAL | Status: DC | PRN
  Administered 2023-03-31: 1000 mL

## 2023-03-31 MED ORDER — DILTIAZEM HCL ER COATED BEADS 180 MG PO CP24
180.0000 mg | ORAL_CAPSULE | Freq: Every day | ORAL | Status: DC
  Administered 2023-04-01 – 2023-04-05 (×5): 180 mg via ORAL
  Filled 2023-03-31 (×5): qty 1

## 2023-03-31 MED ORDER — SODIUM CHLORIDE 0.9% FLUSH
INTRAVENOUS | Status: DC | PRN
  Administered 2023-03-31: 30 mL

## 2023-03-31 MED ORDER — PROPOFOL 500 MG/50ML IV EMUL
INTRAVENOUS | Status: DC | PRN
  Administered 2023-03-31: 40 ug/kg/min via INTRAVENOUS

## 2023-03-31 MED ORDER — INSULIN ASPART 100 UNIT/ML IJ SOLN: 0.0000 [IU] | INTRAMUSCULAR | Status: DC | PRN

## 2023-03-31 MED ORDER — ISOPROPYL ALCOHOL 70 % SOLN
Status: DC | PRN
  Administered 2023-03-31: 1 via TOPICAL

## 2023-03-31 MED ORDER — MENTHOL 3 MG MT LOZG: 1.0000 | LOZENGE | OROMUCOSAL | Status: DC | PRN

## 2023-03-31 MED ORDER — ORAL CARE MOUTH RINSE: 15.0000 mL | Freq: Once | OROMUCOSAL | Status: AC

## 2023-03-31 MED ORDER — GLYCOPYRROLATE 0.2 MG/ML IJ SOLN
INTRAMUSCULAR | Status: DC | PRN
Start: 2023-03-31 — End: 2023-03-31
  Administered 2023-03-31: .1 mg via INTRAVENOUS

## 2023-03-31 MED ORDER — BUPIVACAINE-EPINEPHRINE 0.25% -1:200000 IJ SOLN
INTRAMUSCULAR | Status: AC
  Filled 2023-03-31: qty 1

## 2023-03-31 MED ORDER — BUPIVACAINE LIPOSOME 1.3 % IJ SUSP: 20.0000 mL | Freq: Once | INTRAMUSCULAR | Status: AC

## 2023-03-31 MED ORDER — PHENOL 1.4 % MT LIQD: 1.0000 | OROMUCOSAL | Status: DC | PRN

## 2023-03-31 MED ORDER — GABAPENTIN 300 MG PO CAPS: 600.0000 mg | ORAL_CAPSULE | Freq: Every day | ORAL | Status: DC | PRN

## 2023-03-31 MED ORDER — OMEPRAZOLE 40 MG PO CPDR: 40.0000 mg | DELAYED_RELEASE_CAPSULE | Freq: Every day | ORAL | 0 refills | Status: AC

## 2023-03-31 MED ORDER — FENTANYL CITRATE PF 50 MCG/ML IJ SOSY: 25.0000 ug | PREFILLED_SYRINGE | INTRAMUSCULAR | Status: DC | PRN

## 2023-03-31 MED ORDER — SODIUM CHLORIDE 0.9 % IV SOLN: INTRAVENOUS | Status: DC

## 2023-03-31 MED ORDER — ONDANSETRON HCL 4 MG PO TABS: 4.0000 mg | ORAL_TABLET | Freq: Three times a day (TID) | ORAL | 0 refills | Status: AC | PRN

## 2023-03-31 MED ORDER — FUROSEMIDE 20 MG PO TABS: 20.0000 mg | ORAL_TABLET | Freq: Every day | ORAL | Status: DC | PRN

## 2023-03-31 MED ORDER — INSULIN ASPART 100 UNIT/ML IJ SOLN
0.0000 [IU] | Freq: Three times a day (TID) | INTRAMUSCULAR | Status: DC
  Administered 2023-04-01 (×3): 3 [IU] via SUBCUTANEOUS
  Administered 2023-04-02 – 2023-04-03 (×5): 2 [IU] via SUBCUTANEOUS

## 2023-03-31 MED ORDER — BUPIVACAINE IN DEXTROSE 0.75-8.25 % IT SOLN
INTRATHECAL | Status: DC | PRN
  Administered 2023-03-31: 1.8 mL via INTRATHECAL

## 2023-03-31 MED ORDER — LEVOTHYROXINE SODIUM 112 MCG PO TABS
112.0000 ug | ORAL_TABLET | Freq: Every day | ORAL | Status: DC
  Administered 2023-04-01 – 2023-04-05 (×5): 112 ug via ORAL
  Filled 2023-03-31 (×5): qty 1

## 2023-03-31 MED ORDER — CHLORHEXIDINE GLUCONATE 0.12 % MT SOLN
15.0000 mL | Freq: Once | OROMUCOSAL | Status: AC
  Administered 2023-03-31: 15 mL via OROMUCOSAL

## 2023-03-31 MED ORDER — DIPHENHYDRAMINE HCL 12.5 MG/5ML PO ELIX
12.5000 mg | ORAL_SOLUTION | ORAL | Status: DC | PRN
  Administered 2023-03-31 – 2023-04-02 (×2): 25 mg via ORAL
  Filled 2023-03-31 (×2): qty 10

## 2023-03-31 MED ORDER — BUPIVACAINE LIPOSOME 1.3 % IJ SUSP
INTRAMUSCULAR | Status: AC
  Filled 2023-03-31: qty 20

## 2023-03-31 MED ORDER — DEXAMETHASONE SODIUM PHOSPHATE 10 MG/ML IJ SOLN
INTRAMUSCULAR | Status: AC
  Filled 2023-03-31: qty 1

## 2023-03-31 MED ORDER — PANTOPRAZOLE SODIUM 40 MG PO TBEC
40.0000 mg | DELAYED_RELEASE_TABLET | Freq: Every day | ORAL | Status: DC
  Administered 2023-03-31 – 2023-04-05 (×6): 40 mg via ORAL
  Filled 2023-03-31 (×6): qty 1

## 2023-03-31 MED ORDER — KETOROLAC TROMETHAMINE 0.5 % OP SOLN
1.0000 [drp] | Freq: Three times a day (TID) | OPHTHALMIC | Status: DC
  Administered 2023-03-31 – 2023-04-05 (×14): 1 [drp] via OPHTHALMIC
  Filled 2023-03-31: qty 5

## 2023-03-31 MED ORDER — OXYCODONE HCL 5 MG PO TABS
5.0000 mg | ORAL_TABLET | ORAL | Status: DC | PRN
  Administered 2023-04-01 – 2023-04-02 (×6): 10 mg via ORAL
  Administered 2023-04-03: 5 mg via ORAL
  Administered 2023-04-03 – 2023-04-05 (×4): 10 mg via ORAL
  Filled 2023-03-31 (×8): qty 2
  Filled 2023-03-31: qty 1
  Filled 2023-03-31 (×2): qty 2

## 2023-03-31 MED ORDER — ROPIVACAINE HCL 7.5 MG/ML IJ SOLN
INTRAMUSCULAR | Status: DC | PRN
Start: 2023-03-31 — End: 2023-03-31
  Administered 2023-03-31: 20 mL via PERINEURAL

## 2023-03-31 MED ORDER — ASPIRIN 81 MG PO TBEC: 81.0000 mg | DELAYED_RELEASE_TABLET | Freq: Two times a day (BID) | ORAL | Status: AC

## 2023-03-31 MED ORDER — LIDOCAINE 2% (20 MG/ML) 5 ML SYRINGE
INTRAMUSCULAR | Status: DC | PRN
  Administered 2023-03-31: 100 mg via INTRAVENOUS

## 2023-03-31 MED ORDER — POVIDONE-IODINE 10 % EX SWAB
2.0000 | Freq: Once | CUTANEOUS | Status: AC
  Administered 2023-03-31: 2 via TOPICAL

## 2023-03-31 MED ORDER — TRANEXAMIC ACID-NACL 1000-0.7 MG/100ML-% IV SOLN
1000.0000 mg | INTRAVENOUS | Status: AC
  Administered 2023-03-31: 1000 mg via INTRAVENOUS
  Filled 2023-03-31: qty 100

## 2023-03-31 MED ORDER — METHOCARBAMOL 1000 MG/10ML IJ SOLN: 500.0000 mg | Freq: Four times a day (QID) | INTRAVENOUS | Status: DC | PRN

## 2023-03-31 MED ORDER — DEXAMETHASONE SODIUM PHOSPHATE 10 MG/ML IJ SOLN
8.0000 mg | Freq: Once | INTRAMUSCULAR | Status: AC
  Administered 2023-03-31: 8 mg via INTRAVENOUS

## 2023-03-31 MED ORDER — ONDANSETRON HCL 4 MG/2ML IJ SOLN: 4.0000 mg | Freq: Four times a day (QID) | INTRAMUSCULAR | Status: DC | PRN

## 2023-03-31 MED ORDER — LACTATED RINGERS IV SOLN: INTRAVENOUS | Status: DC

## 2023-03-31 MED ORDER — METHOCARBAMOL 500 MG PO TABS
500.0000 mg | ORAL_TABLET | Freq: Four times a day (QID) | ORAL | Status: DC | PRN
  Administered 2023-04-02 – 2023-04-05 (×4): 500 mg via ORAL
  Filled 2023-03-31 (×4): qty 1

## 2023-03-31 MED ORDER — DAPAGLIFLOZIN PROPANEDIOL 10 MG PO TABS
10.0000 mg | ORAL_TABLET | Freq: Every day | ORAL | Status: DC
  Administered 2023-04-01 – 2023-04-05 (×5): 10 mg via ORAL
  Filled 2023-03-31 (×5): qty 1

## 2023-03-31 MED ORDER — METHOCARBAMOL 500 MG PO TABS: 500.0000 mg | ORAL_TABLET | Freq: Three times a day (TID) | ORAL | 0 refills | Status: AC | PRN

## 2023-03-31 MED ORDER — WATER FOR IRRIGATION, STERILE IR SOLN
Status: DC | PRN
  Administered 2023-03-31: 2000 mL

## 2023-03-31 MED ORDER — DEXAMETHASONE SODIUM PHOSPHATE 10 MG/ML IJ SOLN
INTRAMUSCULAR | Status: DC | PRN
Start: 2023-03-31 — End: 2023-03-31
  Administered 2023-03-31: 10 mg

## 2023-03-31 MED ORDER — ONDANSETRON HCL 4 MG PO TABS: 4.0000 mg | ORAL_TABLET | Freq: Four times a day (QID) | ORAL | Status: DC | PRN

## 2023-03-31 MED ORDER — SODIUM CHLORIDE (PF) 0.9 % IJ SOLN
INTRAMUSCULAR | Status: AC
  Filled 2023-03-31: qty 50

## 2023-03-31 MED ORDER — CEFAZOLIN SODIUM-DEXTROSE 2-4 GM/100ML-% IV SOLN
2.0000 g | INTRAVENOUS | Status: AC
  Administered 2023-03-31: 2 g via INTRAVENOUS
  Filled 2023-03-31: qty 100

## 2023-03-31 MED ORDER — ATORVASTATIN CALCIUM 40 MG PO TABS
40.0000 mg | ORAL_TABLET | Freq: Every day | ORAL | Status: DC
  Administered 2023-03-31 – 2023-04-04 (×5): 40 mg via ORAL
  Filled 2023-03-31 (×5): qty 1

## 2023-03-31 MED ORDER — GLIPIZIDE 5 MG PO TABS
10.0000 mg | ORAL_TABLET | Freq: Two times a day (BID) | ORAL | Status: DC
  Filled 2023-03-31 (×3): qty 2

## 2023-03-31 MED ORDER — POTASSIUM CHLORIDE CRYS ER 20 MEQ PO TBCR
20.0000 meq | EXTENDED_RELEASE_TABLET | Freq: Two times a day (BID) | ORAL | Status: DC
  Administered 2023-04-01 – 2023-04-05 (×9): 20 meq via ORAL
  Filled 2023-03-31 (×9): qty 1

## 2023-03-31 MED ORDER — PROPOFOL 1000 MG/100ML IV EMUL
INTRAVENOUS | Status: AC
  Filled 2023-03-31: qty 100

## 2023-03-31 MED ORDER — POLYETHYLENE GLYCOL 3350 17 G PO PACK
17.0000 g | PACK | Freq: Every day | ORAL | Status: DC | PRN
  Administered 2023-04-04 – 2023-04-05 (×2): 17 g via ORAL
  Filled 2023-03-31 (×2): qty 1

## 2023-03-31 MED ORDER — ACETAMINOPHEN 500 MG PO TABS: 1000.0000 mg | ORAL_TABLET | Freq: Three times a day (TID) | ORAL | Status: AC | PRN

## 2023-03-31 MED ORDER — PHENYLEPHRINE HCL-NACL 20-0.9 MG/250ML-% IV SOLN
INTRAVENOUS | Status: DC | PRN
Start: 2023-03-31 — End: 2023-03-31
  Administered 2023-03-31: 25 ug/min via INTRAVENOUS

## 2023-03-31 MED ORDER — PREDNISOLONE ACETATE 1 % OP SUSP
1.0000 [drp] | Freq: Three times a day (TID) | OPHTHALMIC | Status: DC
  Administered 2023-03-31 – 2023-04-05 (×14): 1 [drp] via OPHTHALMIC
  Filled 2023-03-31: qty 5

## 2023-03-31 MED ORDER — SODIUM CHLORIDE 0.9 % IR SOLN
Status: DC | PRN
  Administered 2023-03-31: 3000 mL

## 2023-03-31 MED ORDER — GLYCOPYRROLATE 0.2 MG/ML IJ SOLN
INTRAMUSCULAR | Status: AC
  Filled 2023-03-31: qty 1

## 2023-03-31 MED ORDER — EZETIMIBE 10 MG PO TABS
10.0000 mg | ORAL_TABLET | Freq: Every day | ORAL | Status: DC
  Administered 2023-03-31 – 2023-04-04 (×5): 10 mg via ORAL
  Filled 2023-03-31 (×5): qty 1

## 2023-03-31 MED ORDER — ACETAMINOPHEN 325 MG PO TABS
325.0000 mg | ORAL_TABLET | Freq: Four times a day (QID) | ORAL | Status: DC | PRN
  Administered 2023-04-02 – 2023-04-04 (×5): 650 mg via ORAL
  Filled 2023-03-31 (×5): qty 2

## 2023-03-31 MED ORDER — ACETAMINOPHEN 500 MG PO TABS
1000.0000 mg | ORAL_TABLET | Freq: Four times a day (QID) | ORAL | Status: AC
  Administered 2023-03-31 – 2023-04-01 (×4): 1000 mg via ORAL
  Filled 2023-03-31 (×4): qty 2

## 2023-03-31 MED ORDER — LABETALOL HCL 5 MG/ML IV SOLN: 10.0000 mg | Freq: Once | INTRAVENOUS | Status: DC

## 2023-03-31 MED ORDER — ONDANSETRON HCL 4 MG/2ML IJ SOLN
INTRAMUSCULAR | Status: DC | PRN
  Administered 2023-03-31: 4 mg via INTRAVENOUS

## 2023-03-31 MED ORDER — CEFAZOLIN SODIUM-DEXTROSE 2-4 GM/100ML-% IV SOLN
2.0000 g | Freq: Four times a day (QID) | INTRAVENOUS | Status: AC
  Administered 2023-03-31 (×2): 2 g via INTRAVENOUS
  Filled 2023-03-31 (×2): qty 100

## 2023-03-31 MED ORDER — DOCUSATE SODIUM 100 MG PO CAPS
100.0000 mg | ORAL_CAPSULE | Freq: Two times a day (BID) | ORAL | Status: DC
  Administered 2023-03-31 – 2023-04-05 (×10): 100 mg via ORAL
  Filled 2023-03-31 (×10): qty 1

## 2023-03-31 MED ORDER — ACETAMINOPHEN 500 MG PO TABS
1000.0000 mg | ORAL_TABLET | Freq: Once | ORAL | Status: AC
  Administered 2023-03-31: 1000 mg via ORAL
  Filled 2023-03-31: qty 2

## 2023-03-31 MED ORDER — POLYETHYLENE GLYCOL 3350 17 G PO PACK: 17.0000 g | PACK | Freq: Every day | ORAL | 0 refills | Status: AC

## 2023-03-31 MED ORDER — OXYCODONE HCL 5 MG PO TABS
5.0000 mg | ORAL_TABLET | ORAL | 0 refills | Status: AC | PRN
Start: 2023-03-31 — End: 2023-04-07

## 2023-03-31 MED ORDER — FENTANYL CITRATE PF 50 MCG/ML IJ SOSY
50.0000 ug | PREFILLED_SYRINGE | Freq: Once | INTRAMUSCULAR | Status: AC
  Administered 2023-03-31: 100 ug via INTRAVENOUS
  Filled 2023-03-31: qty 2

## 2023-03-31 MED ORDER — DROPERIDOL 2.5 MG/ML IJ SOLN: 0.6250 mg | Freq: Once | INTRAMUSCULAR | Status: DC | PRN

## 2023-03-31 MED ORDER — BUPIVACAINE-EPINEPHRINE 0.25% -1:200000 IJ SOLN
INTRAMUSCULAR | Status: DC | PRN
  Administered 2023-03-31: 30 mL

## 2023-03-31 MED ORDER — OXYCODONE HCL 5 MG PO TABS
10.0000 mg | ORAL_TABLET | Freq: Once | ORAL | Status: DC
Start: 2023-03-31 — End: 2023-03-31

## 2023-03-31 MED ORDER — ASPIRIN 81 MG PO CHEW
81.0000 mg | CHEWABLE_TABLET | Freq: Two times a day (BID) | ORAL | Status: DC
  Administered 2023-03-31 – 2023-04-05 (×10): 81 mg via ORAL
  Filled 2023-03-31 (×10): qty 1

## 2023-03-31 MED ORDER — HYDROMORPHONE HCL 1 MG/ML IJ SOLN
0.5000 mg | INTRAMUSCULAR | Status: DC | PRN
  Administered 2023-04-01 – 2023-04-04 (×4): 1 mg via INTRAVENOUS
  Administered 2023-04-04: 0.5 mg via INTRAVENOUS
  Filled 2023-03-31 (×5): qty 1

## 2023-03-31 MED ORDER — ONDANSETRON HCL 4 MG/2ML IJ SOLN
INTRAMUSCULAR | Status: AC
  Filled 2023-03-31: qty 2

## 2023-03-31 MED ORDER — BUPIVACAINE LIPOSOME 1.3 % IJ SUSP
INTRAMUSCULAR | Status: DC | PRN
  Administered 2023-03-31: 20 mL

## 2023-03-31 SURGICAL SUPPLY — 66 items
ADH SKN CLS APL DERMABOND .7 (GAUZE/BANDAGES/DRESSINGS) ×2
APL PRP STRL LF DISP 70% ISPRP (MISCELLANEOUS) ×2
BAG COUNTER SPONGE SURGICOUNT (BAG) IMPLANT
BAG SPNG CNTER NS LX DISP (BAG)
BLADE SAG 18X100X1.27 (BLADE) ×2 IMPLANT
BLADE SAW SAG 35X64 .89 (BLADE) ×2 IMPLANT
BNDG CMPR 5X3 CHSV STRCH STRL (GAUZE/BANDAGES/DRESSINGS) ×1
BNDG CMPR MED 10X6 ELC LF (GAUZE/BANDAGES/DRESSINGS) ×1
BNDG CMPR MED 15X6 ELC VLCR LF (GAUZE/BANDAGES/DRESSINGS) ×1
BNDG COHESIVE 3X5 TAN ST LF (GAUZE/BANDAGES/DRESSINGS) ×2 IMPLANT
BNDG ELASTIC 6X10 VLCR STRL LF (GAUZE/BANDAGES/DRESSINGS) ×2 IMPLANT
BNDG ELASTIC 6X15 VLCR STRL LF (GAUZE/BANDAGES/DRESSINGS) IMPLANT
BOWL SMART MIX CTS (DISPOSABLE) ×2 IMPLANT
BSPLAT TIB 5D E CMNT KN RT (Knees) ×1 IMPLANT
CEMENT BONE R 1X40 (Cement) IMPLANT
CEMENT BONE REFOBACIN R1X40 US (Cement) IMPLANT
CHLORAPREP W/TINT 26 (MISCELLANEOUS) ×4 IMPLANT
COMP FEM CMT CR PERS SZ8 RT (Joint) ×1 IMPLANT
COMPONENT FEM CMT CR PRS SZ8RT (Joint) IMPLANT
COVER SURGICAL LIGHT HANDLE (MISCELLANEOUS) ×2 IMPLANT
CUFF TOURN SGL QUICK 34 (TOURNIQUET CUFF) ×1
CUFF TRNQT CYL 34X4.125X (TOURNIQUET CUFF) ×2 IMPLANT
DERMABOND ADVANCED .7 DNX12 (GAUZE/BANDAGES/DRESSINGS) ×2 IMPLANT
DRAPE INCISE IOBAN 85X60 (DRAPES) ×2 IMPLANT
DRAPE SHEET LG 3/4 BI-LAMINATE (DRAPES) ×2 IMPLANT
DRAPE U-SHAPE 47X51 STRL (DRAPES) ×2 IMPLANT
DRESSING AQUACEL AG SP 3.5X10 (GAUZE/BANDAGES/DRESSINGS) ×2 IMPLANT
DRSG AQUACEL AG ADV 3.5X10 (GAUZE/BANDAGES/DRESSINGS) IMPLANT
DRSG AQUACEL AG SP 3.5X10 (GAUZE/BANDAGES/DRESSINGS) ×1
ELECT REM PT RETURN 15FT ADLT (MISCELLANEOUS) ×2 IMPLANT
GAUZE SPONGE 4X4 12PLY STRL (GAUZE/BANDAGES/DRESSINGS) ×2 IMPLANT
GLOVE BIO SURGEON STRL SZ 6.5 (GLOVE) ×4 IMPLANT
GLOVE BIOGEL PI IND STRL 6.5 (GLOVE) ×2 IMPLANT
GLOVE BIOGEL PI IND STRL 8 (GLOVE) ×2 IMPLANT
GLOVE SURG ORTHO 8.0 STRL STRW (GLOVE) ×4 IMPLANT
GOWN STRL REUS W/ TWL XL LVL3 (GOWN DISPOSABLE) ×4 IMPLANT
GOWN STRL REUS W/TWL XL LVL3 (GOWN DISPOSABLE) ×2
HANDPIECE INTERPULSE COAX TIP (DISPOSABLE) ×1
HOLDER FOLEY CATH W/STRAP (MISCELLANEOUS) ×2 IMPLANT
HOOD PEEL AWAY T7 (MISCELLANEOUS) ×6 IMPLANT
INSERT TIB ARTISURF SZ8-11X12 (Insert) IMPLANT
KIT TURNOVER KIT A (KITS) IMPLANT
MANIFOLD NEPTUNE II (INSTRUMENTS) ×2 IMPLANT
MARKER SKIN DUAL TIP RULER LAB (MISCELLANEOUS) ×2 IMPLANT
NS IRRIG 1000ML POUR BTL (IV SOLUTION) ×2 IMPLANT
PACK TOTAL KNEE CUSTOM (KITS) ×2 IMPLANT
SCREW HEADED 33MM KNEE (MISCELLANEOUS) IMPLANT
SET HNDPC FAN SPRY TIP SCT (DISPOSABLE) ×2 IMPLANT
SOLUTION IRRIG SURGIPHOR (IV SOLUTION) IMPLANT
SPIKE FLUID TRANSFER (MISCELLANEOUS) ×2 IMPLANT
STEM POLY PAT PLY 32M KNEE (Knees) IMPLANT
STEM TIBIA 5 DEG SZ E R KNEE (Knees) IMPLANT
STRIP CLOSURE SKIN 1/2X4 (GAUZE/BANDAGES/DRESSINGS) ×2 IMPLANT
SUT MNCRL AB 3-0 PS2 18 (SUTURE) ×2 IMPLANT
SUT STRATAFIX PDO 1 14 VIOLET (SUTURE) ×1
SUT STRATFX PDO 1 14 VIOLET (SUTURE) ×1
SUT VIC AB 2-0 CT2 27 (SUTURE) ×4 IMPLANT
SUT VLOC 180 0 24IN GS25 (SUTURE) ×2 IMPLANT
SUTURE STRATFX PDO 1 14 VIOLET (SUTURE) ×2 IMPLANT
SYR 50ML LL SCALE MARK (SYRINGE) ×2 IMPLANT
TAPE STRIPS DRAPE STRL (GAUZE/BANDAGES/DRESSINGS) IMPLANT
TIBIA STEM 5 DEG SZ E R KNEE (Knees) ×1 IMPLANT
TRAY FOLEY MTR SLVR 14FR STAT (SET/KITS/TRAYS/PACK) IMPLANT
TUBE SUCTION HIGH CAP CLEAR NV (SUCTIONS) ×2 IMPLANT
UNDERPAD 30X36 HEAVY ABSORB (UNDERPADS AND DIAPERS) ×2 IMPLANT
WRAP KNEE MAXI GEL POST OP (GAUZE/BANDAGES/DRESSINGS) IMPLANT

## 2023-03-31 NOTE — Anesthesia Procedure Notes (Addendum)
Spinal  Start time: 03/31/2023 10:39 AM End time: 03/31/2023 10:44 AM Staffing Performed: resident/CRNA  Resident/CRNA: Elisabeth Cara, CRNA Performed by: Elisabeth Cara, CRNA Authorized by: Lewie Loron, MD   Preanesthetic Checklist Completed: patient identified, IV checked, site marked, risks and benefits discussed, surgical consent, monitors and equipment checked, pre-op evaluation and timeout performed Spinal Block Patient position: sitting Prep: DuraPrep Patient monitoring: heart rate, cardiac monitor, continuous pulse ox and blood pressure Approach: midline Location: L2-3 Injection technique: single-shot Needle Needle type: Pencan  Needle gauge: 24 G Needle length: 10 cm Assessment Sensory level: T4 Events: CSF return Additional Notes Pt placed in sitting position with assistance. Sterile prep and drape of back. Spinal kit expiration date checked and verified. Site anesthetized  with local. One attempt by CRNA. Clear free flowing CSF obtained. - heme. Pt tolerated the procedure well. Pt placed supine after placement

## 2023-03-31 NOTE — Discharge Instructions (Signed)
INSTRUCTIONS AFTER JOINT REPLACEMENT   Remove items at home which could result in a fall. This includes throw rugs or furniture in walking pathways ICE to the affected joint every three hours while awake for 30 minutes at a time, for at least the first 3-5 days, and then as needed for pain and swelling.  Continue to use ice for pain and swelling. You may notice swelling that will progress down to the foot and ankle.  This is normal after surgery.  Elevate your leg when you are not up walking on it.   Continue to use the breathing machine you got in the hospital (incentive spirometer) which will help keep your temperature down.  It is common for your temperature to cycle up and down following surgery, especially at night when you are not up moving around and exerting yourself.  The breathing machine keeps your lungs expanded and your temperature down.   DIET:  As you were doing prior to hospitalization, we recommend a well-balanced diet.  DRESSING / WOUND CARE / SHOWERING  Keep the surgical dressing until follow up.  The dressing is water proof, so you can shower without any extra covering.  IF THE DRESSING FALLS OFF or the wound gets wet inside, change the dressing with sterile gauze.  Please use good hand washing techniques before changing the dressing.  Do not use any lotions or creams on the incision until instructed by your surgeon.    ACTIVITY  Increase activity slowly as tolerated, but follow the weight bearing instructions below.   No driving for 6 weeks or until further direction given by your physician.  You cannot drive while taking narcotics.  No lifting or carrying greater than 10 lbs. until further directed by your surgeon. Avoid periods of inactivity such as sitting longer than an hour when not asleep. This helps prevent blood clots.  You may return to work once you are authorized by your doctor.     WEIGHT BEARING   Weight bearing as tolerated with assist device (walker, cane,  etc) as directed, use it as long as suggested by your surgeon or therapist, typically at least 4-6 weeks.   EXERCISES  Results after joint replacement surgery are often greatly improved when you follow the exercise, range of motion and muscle strengthening exercises prescribed by your doctor. Safety measures are also important to protect the joint from further injury. Any time any of these exercises cause you to have increased pain or swelling, decrease what you are doing until you are comfortable again and then slowly increase them. If you have problems or questions, call your caregiver or physical therapist for advice.   Rehabilitation is important following a joint replacement. After just a few days of immobilization, the muscles of the leg can become weakened and shrink (atrophy).  These exercises are designed to build up the tone and strength of the thigh and leg muscles and to improve motion. Often times heat used for twenty to thirty minutes before working out will loosen up your tissues and help with improving the range of motion but do not use heat for the first two weeks following surgery (sometimes heat can increase post-operative swelling).   These exercises can be done on a training (exercise) mat, on the floor, on a table or on a bed. Use whatever works the best and is most comfortable for you.    Use music or television while you are exercising so that the exercises are a pleasant break in your  day. This will make your life better with the exercises acting as a break in your routine that you can look forward to.   Perform all exercises about fifteen times, three times per day or as directed.  You should exercise both the operative leg and the other leg as well.  Exercises include:   Quad Sets - Tighten up the muscle on the front of the thigh (Quad) and hold for 5-10 seconds.   Straight Leg Raises - With your knee straight (if you were given a brace, keep it on), lift the leg to 60  degrees, hold for 3 seconds, and slowly lower the leg.  Perform this exercise against resistance later as your leg gets stronger.  Leg Slides: Lying on your back, slowly slide your foot toward your buttocks, bending your knee up off the floor (only go as far as is comfortable). Then slowly slide your foot back down until your leg is flat on the floor again.  Angel Wings: Lying on your back spread your legs to the side as far apart as you can without causing discomfort.  Hamstring Strength:  Lying on your back, push your heel against the floor with your leg straight by tightening up the muscles of your buttocks.  Repeat, but this time bend your knee to a comfortable angle, and push your heel against the floor.  You may put a pillow under the heel to make it more comfortable if necessary.   A rehabilitation program following joint replacement surgery can speed recovery and prevent re-injury in the future due to weakened muscles. Contact your doctor or a physical therapist for more information on knee rehabilitation.    CONSTIPATION  Constipation is defined medically as fewer than three stools per week and severe constipation as less than one stool per week.  Even if you have a regular bowel pattern at home, your normal regimen is likely to be disrupted due to multiple reasons following surgery.  Combination of anesthesia, postoperative narcotics, change in appetite and fluid intake all can affect your bowels.   YOU MUST use at least one of the following options; they are listed in order of increasing strength to get the job done.  They are all available over the counter, and you may need to use some, POSSIBLY even all of these options:    Drink plenty of fluids (prune juice may be helpful) and high fiber foods Colace 100 mg by mouth twice a day  Senokot for constipation as directed and as needed Dulcolax (bisacodyl), take with full glass of water  Miralax (polyethylene glycol) once or twice a day as  needed.  If you have tried all these things and are unable to have a bowel movement in the first 3-4 days after surgery call either your surgeon or your primary doctor.    If you experience loose stools or diarrhea, hold the medications until you stool forms back up.  If your symptoms do not get better within 1 week or if they get worse, check with your doctor.  If you experience "the worst abdominal pain ever" or develop nausea or vomiting, please contact the office immediately for further recommendations for treatment.   ITCHING:  If you experience itching with your medications, try taking only a single pain pill, or even half a pain pill at a time.  You can also use Benadryl over the counter for itching or also to help with sleep.   TED HOSE STOCKINGS:  Use stockings on both  legs until for at least 2 weeks or as directed by physician office. They may be removed at night for sleeping.  MEDICATIONS:  See your medication summary on the "After Visit Summary" that nursing will review with you.  You may have some home medications which will be placed on hold until you complete the course of blood thinner medication.  It is important for you to complete the blood thinner medication as prescribed.   Blood clot prevention (DVT Prophylaxis): After surgery you are at an increased risk for a blood clot. you were prescribed a blood thinner, Aspirin 81mg , to be taken twice daily for a total of 4 weeks from surgery to help reduce your risk of getting a blood clot. This will help prevent a blood clot. Signs of a pulmonary embolus (blood clot in the lungs) include sudden short of breath, feeling lightheaded or dizzy, chest pain with a deep breath, rapid pulse rapid breathing. Signs of a blood clot in your arms or legs include new unexplained swelling and cramping, warm, red or darkened skin around the painful area. Please call the office or 911 right away if these signs or symptoms develop.  PRECAUTIONS:  If you  experience chest pain or shortness of breath - call 911 immediately for transfer to the hospital emergency department.   If you develop a fever greater that 101 F, purulent drainage from wound, increased redness or drainage from wound, foul odor from the wound/dressing, or calf pain - CONTACT YOUR SURGEON.                                                   FOLLOW-UP APPOINTMENTS:  If you do not already have a post-op appointment, please call the office for an appointment to be seen by your surgeon.  Guidelines for how soon to be seen are listed in your "After Visit Summary", but are typically between 2-3 weeks after surgery.  OTHER INSTRUCTIONS:   Knee Replacement:  Do not place pillow under knee, focus on keeping the knee straight while resting.  Place foam block, curve side up under heel at all times except when walking.  DO NOT modify, tear, cut, or change the foam block in any way.  POST-OPERATIVE OPIOID TAPER INSTRUCTIONS: It is important to wean off of your opioid medication as soon as possible. If you do not need pain medication after your surgery it is ok to stop day one. Opioids include: Codeine, Hydrocodone(Norco, Vicodin), Oxycodone(Percocet, oxycontin) and hydromorphone amongst others.  Long term and even short term use of opiods can cause: Increased pain response Dependence Constipation Depression Respiratory depression And more.  Withdrawal symptoms can include Flu like symptoms Nausea, vomiting And more Techniques to manage these symptoms Hydrate well Eat regular healthy meals Stay active Use relaxation techniques(deep breathing, meditating, yoga) Do Not substitute Alcohol to help with tapering If you have been on opioids for less than two weeks and do not have pain than it is ok to stop all together.  Plan to wean off of opioids This plan should start within one week post op of your joint replacement. Maintain the same interval or time between taking each dose and  first decrease the dose.  Cut the total daily intake of opioids by one tablet each day Next start to increase the time between doses. The last dose that should  be eliminated is the evening dose.   MAKE SURE YOU:  Understand these instructions.  Get help right away if you are not doing well or get worse.    Thank you for letting us be a part of your medical care team.  It is a privilege we respect greatly.  We hope these instructions will help you stay on track for a fast and full recovery!

## 2023-03-31 NOTE — Interval H&P Note (Signed)

## 2023-03-31 NOTE — Anesthesia Preprocedure Evaluation (Addendum)
Anesthesia Evaluation  Patient identified by MRN, date of birth, ID band Patient awake    Reviewed: Allergy & Precautions, NPO status , Patient's Chart, lab work & pertinent test results  Airway Mallampati: II  TM Distance: >3 FB Neck ROM: Full    Dental no notable dental hx.    Pulmonary former smoker   Pulmonary exam normal        Cardiovascular hypertension,  Rhythm:Regular Rate:Normal     Neuro/Psych negative neurological ROS  negative psych ROS   GI/Hepatic negative GI ROS, Neg liver ROS,,,  Endo/Other  diabetes, Type 2, Oral Hypoglycemic AgentsHypothyroidism    Renal/GU CRFRenal disease  negative genitourinary   Musculoskeletal  (+) Arthritis , Osteoarthritis,    Abdominal Normal abdominal exam  (+)   Peds  Hematology Lab Results      Component                Value               Date                      WBC                      7.5                 03/21/2023                HGB                      12.6                03/21/2023                HCT                      39.6                03/21/2023                MCV                      93.8                03/21/2023                PLT                      292                 03/21/2023              Anesthesia Other Findings   Reproductive/Obstetrics                              Anesthesia Physical Anesthesia Plan  ASA: 2  Anesthesia Plan: MAC, Regional and Spinal   Post-op Pain Management: Regional block*   Induction: Intravenous  PONV Risk Score and Plan: 2 and Ondansetron, Dexamethasone, Propofol infusion and Treatment may vary due to age or medical condition  Airway Management Planned: Simple Face Mask and Nasal Cannula  Additional Equipment: None  Intra-op Plan:   Post-operative Plan:   Informed Consent: I have reviewed the patients History and Physical, chart, labs and discussed the procedure including the  risks, benefits and alternatives for the proposed anesthesia with the  patient or authorized representative who has indicated his/her understanding and acceptance.     Dental advisory given  Plan Discussed with: CRNA  Anesthesia Plan Comments:         Anesthesia Quick Evaluation

## 2023-03-31 NOTE — Transfer of Care (Signed)
Immediate Anesthesia Transfer of Care Note  [name]  Procedure(s) Performed: TOTAL KNEE ARTHROPLASTY (Right: Knee)  Patient Location: PACU  Anesthesia Type:MAC and Spinal  Level of Consciousness: awake, alert , oriented, and patient cooperative  Airway & Oxygen Therapy: Patient Spontanous Breathing and Patient connected to face mask oxygen  Post-op Assessment: Report given to RN and Post -op Vital signs reviewed and stable  Post vital signs: Reviewed and stable  Last Vitals:  Vitals Value Taken Time  BP 117/61 03/31/23 1254  Temp    Pulse    Resp 15 03/31/23 1256  SpO2    Vitals shown include unfiled device data.  Last Pain:  Vitals:   03/31/23 0947  PainSc: 0-No pain         Complications: No notable events documented.

## 2023-03-31 NOTE — Op Note (Signed)
DATE OF SURGERY:  03/31/2023 TIME: 12:44 PM  PATIENT NAME:  Joan Torres   AGE: 77 y.o.    PRE-OPERATIVE DIAGNOSIS: End-stage right knee osteoarthritis  POST-OPERATIVE DIAGNOSIS:  Same  PROCEDURE: Right total Knee Arthroplasty  SURGEON:  Donyel Castagnola A Lorence Nagengast, MD   ASSISTANT: Kathie Dike, PA-C, present and scrubbed throughout the case, critical for assistance with exposure, retraction, instrumentation, and closure.   OPERATIVE IMPLANTS:  .  Cemented Zimmer persona size 8 right narrow femur, E right tibial baseplate, 12 mm MC insert, 32 mm all poly cemented patella Implant Name Type Inv. Item Serial No. Manufacturer Lot No. LRB No. Used Action  CEMENT BONE REFOBACIN R1X40 [place] - [id number] Cement CEMENT BONE REFOBACIN R1X40 [place]  ZIMMER RECON(ORTH,TRAU,BIO,SG) [id number] Right 1 Implanted  CEMENT BONE REFOBACIN R1X40 [place] - [id number] Cement CEMENT BONE REFOBACIN R1X40 [place]  ZIMMER RECON(ORTH,TRAU,BIO,SG) [id number] Right 1 Implanted  COMP FEM CMT CR PERS SZ8 RT - [id number] Joint COMP FEM CMT CR PERS SZ8 RT  ZIMMER RECON(ORTH,TRAU,BIO,SG) [id number] Right 1 Implanted  BSPLAT TIB 5D E CMNT KN RT - [id number] Knees BSPLAT TIB 5D E CMNT KN RT  ZIMMER RECON(ORTH,TRAU,BIO,SG) [id number] Right 1 Implanted  STEM POLY PAT PLY 77M KNEE - [id number] Knees STEM POLY PAT PLY 77M KNEE  ZIMMER RECON(ORTH,TRAU,BIO,SG) [id number] Right 1 Implanted  INSERT TIB ARTISURF [id number] - [id number] Insert INSERT TIB ARTISURF [id number]  ZIMMER RECON(ORTH,TRAU,BIO,SG) 12/11/2027 Right 1 Implanted      PREOPERATIVE INDICATIONS:  RAINA Torres is a 77 y.o. year old female with end stage bone on bone degenerative arthritis of the knee who failed conservative treatment, including injections, antiinflammatories, activity modification, and assistive devices, and had significant impairment of their activities of daily living, and elected for Total Knee Arthroplasty.   The risks, benefits, and alternatives were discussed  at length including but not limited to the risks of infection, bleeding, nerve injury, stiffness, blood clots, the need for revision surgery, cardiopulmonary complications, among others, and they were willing to proceed.  ESTIMATED BLOOD LOSS: 50cc  OPERATIVE DESCRIPTION:   Once adequate anesthesia was induced, preoperative antibiotics, 2 gm of ancef,1 gm of Tranexamic Acid, and 8 mg of Decadron administered, the patient was positioned supine with a right thigh tourniquet placed.  The right lower extremity was prepped and draped in sterile fashion.  A time-  out was performed identifying the patient, planned procedure, and the appropriate extremity.     The leg was  exsanguinated, tourniquet elevated to 250 mmHg.  A midline incision was  made followed by median parapatellar arthrotomy. Anterior horn of the medial meniscus was released and resected. A medial release was performed, the infrapatellar fat pad was resected with care taken to protect the patellar tendon. The suprapatellar fat was removed to exposed the distal anterior femur. The anterior horn of the lateral meniscus and ACL were released.    Following initial  exposure, I first started with the femur  The femoral  canal was opened with a drill, canal was suctioned to try to prevent fat emboli.  An  intramedullary rod was passed set at 6 degrees valgus, 10 mm. The distal femur was resected.  Following this resection, the tibia was  subluxated anteriorly.  Using the extramedullary guide, 10 mm of bone was resected off the proximal lateral tibia.  We confirmed the gap would be  stable medially and laterally with a size 10mm spacer block as well as confirmed that the tibial cut was perpendicular in the coronal plane,  checking with an alignment rod.    Once this was done, the posterior femoral referencing femoral sizer was placed under to the posterior condyles with 3 degrees of external rotational which was parallel to the transepicondylar axis  and perpendicular to [employer]. The femur was sized to be a size 8 in the anterior-  posterior dimension. The  anterior, posterior, and  chamfer cuts were made without difficulty nor   notching making certain that I was along the anterior cortex to help  with flexion gap stability. Next a laminar spreader was placed with the knee in flexion and the medial lateral menisci were resected.  5 cc of the Exparel mixture was injected in the medial side of the back of the knee and 3 cc in the lateral side.  1/2 inch curved osteotome was used to resect posterior osteophyte that was then removed with a pituitary rongeur.       At this point, the tibia was sized to be a size E.  The size E tray was  then pinned in position. Trial reduction was now carried with a 8 femur, E tibia, a 10 mm MC insert.  The knee was in some hyperextension and loose in flexion so upsized to a 12 mm poly.  The knee had full extension and was stable to varus valgus stress in extension.  The knee was stable in flexion the PCL was left intact.  Attention was next directed to the patella.  Precut  measurement was noted to be 24 mm.  I resected down to 14 mm and used a  32mm patellar button to restore patellar height as well as cover the cut surface.     The patella lug holes were drilled and a 32mm patella poly trial was placed.    The knee was brought to full extension with good flexion stability with the patella tracking through the trochlea without application of pressure.     Next the femoral component was again assessed and determined to be seated and appropriately lateralized.  The femoral lug holes were drilled.  The femoral component was then removed. Tibial component was again assessed and felt to be seated and appropriately rotated with the medial third of the tubercle. The tibia was then drilled, and keel punched.     Final components were  opened and antibiotic cement was mixed.      Final implants were then  cemented  onto cleaned and dried cut surfaces of bone with the knee brought to extension with a 12 mm MC poly.  The knee was irrigated with sterile Betadine diluted in saline as well as pulse lavage normal saline.  The synovial lining was  then injected a dilute Exparel with 30cc of 0.25% marcaine with epinephrine.         Once the cement had fully cured, excess cement was removed throughout the knee.  I confirmed that I was satisfied with the range of motion and stability, and the final 12mm MC poly insert was chosen.  It was placed into the knee.         The tourniquet had been let down at 60 minutes.  No significant hemostasis was required.  The medial parapatellar arthrotomy was then reapproximated using #1 Stratafix sutures with the knee  in flexion.  The remaining wound was closed with 0 stratafix, 2-0 Vicryl, and running 3-0 Monocryl. The knee was cleaned, dried, dressed sterilely using Dermabond and   Aquacel dressing.  The patient was then brought  to recovery room in stable condition, tolerating the procedure  well. There were no complications.   Post op recs: WB: WBAT Abx: ancef Imaging: PACU xrays DVT prophylaxis: Aspirin 81mg  BID x4 weeks Follow up: 2 weeks after surgery for a wound check with Dr. Blanchie Dessert at [facility]  Address: [address], [place], [place] [address]  Office Phone: [phone]  Weber Cooks, MD Orthopaedic Surgery

## 2023-03-31 NOTE — Progress Notes (Signed)
Orthopedic Tech Progress Note Patient Details:  Joan Torres 10-22-1945 [id number]  Ortho Devices Type of Ortho Device: Bone foam zero knee Ortho Device/Splint Interventions: Ordered     Bone foam left at bedside due to patient currently having X-rays taken. Darleen Crocker 03/31/2023, 1:13 PM

## 2023-03-31 NOTE — Anesthesia Procedure Notes (Signed)
Procedure Name: MAC Date/Time: 03/31/2023 10:36 AM  Performed by: Elisabeth Cara, CRNAPre-anesthesia Checklist: Emergency Drugs available, Patient identified, Suction available and Timeout performed Patient Re-evaluated:Patient Re-evaluated prior to induction Oxygen Delivery Method: Simple face mask Placement Confirmation: positive ETCO2 Dental Injury: Teeth and Oropharynx as per pre-operative assessment

## 2023-03-31 NOTE — Anesthesia Procedure Notes (Signed)
Anesthesia Regional Block: Adductor canal block   Pre-Anesthetic Checklist: , timeout performed,  Correct Patient, Correct Site, Correct Laterality,  Correct Procedure, Correct Position, site marked,  Risks and benefits discussed,  Surgical consent,  Pre-op evaluation,  At surgeon's request and post-op pain management  Laterality: Right  Prep: Dura Prep       Needles:  Injection technique: Single-shot  Needle Type: Echogenic Stimulator Needle     Needle Length: 10cm  Needle Gauge: 20     Additional Needles:   Procedures:,,,, ultrasound used (permanent image in chart),,    Narrative:  Start time: 03/31/2023 9:40 AM End time: 03/31/2023 9:45 AM Injection made incrementally with aspirations every 5 mL.  Performed by: Personally  Anesthesiologist: Atilano Median, DO  Additional Notes: Patient identified. Risks/Benefits/Options discussed with patient including but not limited to bleeding, infection, nerve damage, failed block, incomplete pain control. Patient expressed understanding and wished to proceed. All questions were answered. Sterile technique was used throughout the entire procedure. Please see nursing notes for vital signs. Aspirated in 5cc intervals with injection for negative confirmation. Patient was given instructions on fall risk and not to get out of bed. All questions and concerns addressed with instructions to call with any issues or inadequate analgesia.

## 2023-04-01 ENCOUNTER — Other Ambulatory Visit: Payer: Self-pay

## 2023-04-01 ENCOUNTER — Encounter (HOSPITAL_COMMUNITY): Payer: Self-pay | Admitting: Orthopedic Surgery

## 2023-04-01 DIAGNOSIS — Y838 Other surgical procedures as the cause of abnormal reaction of the patient, or of later complication, without mention of misadventure at the time of the procedure: Secondary | ICD-10-CM | POA: Diagnosis not present

## 2023-04-01 DIAGNOSIS — Z79890 Hormone replacement therapy (postmenopausal): Secondary | ICD-10-CM | POA: Diagnosis not present

## 2023-04-01 DIAGNOSIS — M1711 Unilateral primary osteoarthritis, right knee: Secondary | ICD-10-CM | POA: Diagnosis present

## 2023-04-01 DIAGNOSIS — I97191 Other postprocedural cardiac functional disturbances following other surgery: Secondary | ICD-10-CM | POA: Diagnosis not present

## 2023-04-01 DIAGNOSIS — Z7982 Long term (current) use of aspirin: Secondary | ICD-10-CM | POA: Diagnosis not present

## 2023-04-01 DIAGNOSIS — N1832 Chronic kidney disease, stage 3b: Secondary | ICD-10-CM | POA: Diagnosis present

## 2023-04-01 DIAGNOSIS — I129 Hypertensive chronic kidney disease with stage 1 through stage 4 chronic kidney disease, or unspecified chronic kidney disease: Secondary | ICD-10-CM | POA: Diagnosis present

## 2023-04-01 DIAGNOSIS — E039 Hypothyroidism, unspecified: Secondary | ICD-10-CM | POA: Diagnosis present

## 2023-04-01 DIAGNOSIS — I441 Atrioventricular block, second degree: Secondary | ICD-10-CM | POA: Diagnosis not present

## 2023-04-01 DIAGNOSIS — Z7984 Long term (current) use of oral hypoglycemic drugs: Secondary | ICD-10-CM | POA: Diagnosis not present

## 2023-04-01 DIAGNOSIS — E1122 Type 2 diabetes mellitus with diabetic chronic kidney disease: Secondary | ICD-10-CM | POA: Diagnosis present

## 2023-04-01 DIAGNOSIS — Z79899 Other long term (current) drug therapy: Secondary | ICD-10-CM | POA: Diagnosis not present

## 2023-04-01 DIAGNOSIS — Y792 Prosthetic and other implants, materials and accessory orthopedic devices associated with adverse incidents: Secondary | ICD-10-CM | POA: Diagnosis not present

## 2023-04-01 DIAGNOSIS — Z23 Encounter for immunization: Secondary | ICD-10-CM | POA: Diagnosis present

## 2023-04-01 DIAGNOSIS — Z87891 Personal history of nicotine dependence: Secondary | ICD-10-CM | POA: Diagnosis not present

## 2023-04-01 LAB — BASIC METABOLIC PANEL
Anion gap: 8 (ref 5–15)
BUN: 16 mg/dL (ref 8–23)
CO2: 22 mmol/L (ref 22–32)
Calcium: 9.8 mg/dL (ref 8.9–10.3)
Chloride: 105 mmol/L (ref 98–111)
Creatinine, Ser: 1.54 mg/dL — ABNORMAL HIGH (ref 0.44–1.00)
GFR, Estimated: 34 mL/min — ABNORMAL LOW (ref >60)
Glucose, Bld: 168 mg/dL — ABNORMAL HIGH (ref 70–99)
Potassium: 4.2 mmol/L (ref 3.5–5.1)
Sodium: 135 mmol/L (ref 135–145)

## 2023-04-01 LAB — GLUCOSE, CAPILLARY
Glucose-Capillary: 161 mg/dL — ABNORMAL HIGH (ref 70–99)
Glucose-Capillary: 166 mg/dL — ABNORMAL HIGH (ref 70–99)
Glucose-Capillary: 159 mg/dL — ABNORMAL HIGH (ref 70–99)
Glucose-Capillary: 210 mg/dL — ABNORMAL HIGH (ref 70–99)

## 2023-04-01 LAB — CBC
HCT: 40.3 % (ref 36.0–46.0)
Hemoglobin: 13.4 g/dL (ref 12.0–15.0)
MCH: 29.6 pg (ref 26.0–34.0)
MCHC: 33.3 g/dL (ref 30.0–36.0)
MCV: 89 fL (ref 80.0–100.0)
Platelets: 182 10*3/uL (ref 150–400)
RBC: 4.53 MIL/uL (ref 3.87–5.11)
RDW: 14.6 % (ref 11.5–15.5)
WBC: 11.2 10*3/uL — ABNORMAL HIGH (ref 4.0–10.5)
nRBC: 0 % (ref 0.0–0.2)

## 2023-04-01 MED ORDER — GATIFLOXACIN 0.5 % OP SOLN
1.0000 [drp] | Freq: Four times a day (QID) | OPHTHALMIC | Status: DC
  Administered 2023-04-01 – 2023-04-05 (×14): 1 [drp] via OPHTHALMIC
  Filled 2023-04-01: qty 2.5

## 2023-04-01 MED ORDER — GATIFLOXACIN 0.5 % OP SOLN
1.0000 [drp] | Freq: Three times a day (TID) | OPHTHALMIC | Status: DC
Start: 2023-04-01 — End: 2023-04-01

## 2023-04-01 MED ORDER — NON FORMULARY
1.0000 [drp] | Freq: Four times a day (QID) | Status: DC
Start: 2023-04-01 — End: 2023-04-01

## 2023-04-01 NOTE — TOC Initial Note (Addendum)
Transition of Care [facility]) - Initial/Assessment Note    Patient Details  Name: Joan Torres MRN: [id number] Date of Birth: May 08, 1946  Transition of Care [facility]) CM/SW Contact:    Darleene Cleaver, LCSW Phone Number: 04/01/2023, 10:56 AM  Clinical Narrative:                  CSW spoke to attending physician, home health has been arranged already at physician's office.  Home Health orders and face to face were sent from MD office.    Patient will be receiving HH PT from [facility] - [phone].  [employer] has delivered the rolling walker already for patient.  CSW attempted to speak to patient, but she is sleeping.  CSW to follow up with her at a later time.      Barriers to Discharge: Barriers Resolved   Patient Goals and CMS Choice Patient states their goals for this hospitalization and ongoing recovery are:: To return back home with home health PT. CMS Medicare.gov Compare Post Acute Care list provided to:: Patient Choice offered to / list presented to : Patient [place] ownership interest in [facility].provided to:: Patient    Expected Discharge Plan and Services     Post Acute Care Choice: Durable Medical Equipment Living arrangements for the past 2 months: Single Family Home                 DME Arranged: Walker rolling DME Agency: [employer]       HH Arranged: PT HH Agency: Other - See comment ([facility] -  [phone].  Home health prearranged at physician's office.)        Prior Living Arrangements/Services Living arrangements for the past 2 months: Single Family Home Lives with:: Significant Other Patient language and need for interpreter reviewed:: Yes Do you feel safe going back to the place where you live?: Yes      Need for Family Participation in Patient Care: No (Comment) Care giver support system in place?: Yes (comment)   Criminal Activity/Legal Involvement Pertinent to Current  Situation/Hospitalization: No - Comment as needed  Activities of Daily Living      Permission Sought/Granted   Permission granted to share information with : Yes, Release of Information Signed  Share Information with NAME: Hairston,James Significant other [phone]  [phone]  Permission granted to share info w AGENCY: [facility] agencies        Emotional Assessment Appearance:: Appears stated age   Affect (typically observed): Accepting, Appropriate, Calm Orientation: : Oriented to Self, Oriented to Place, Oriented to  Time, Oriented to Situation Alcohol / Substance Use: Not Applicable Psych Involvement: No (comment)  Admission diagnosis:  Primary osteoarthritis of right knee [M17.11] Patient Active Problem List   Diagnosis Date Noted   Primary osteoarthritis of right knee 03/31/2023   Spondylolisthesis at L4-L5 level 01/20/2018   ARTHRITIS, RIGHT KNEE 11/01/2009   CARPAL TUNNEL SYNDROME 11/09/2007   DEGENERATIVE JOINT DISEASE, LEFT KNEE 06/29/2007   KNEE PAIN 06/29/2007   DIABETES 06/26/2007   HIGH BLOOD PRESSURE 06/26/2007   PCP:  Juliette Alcide, MD Pharmacy:   [facility] [address],  - [address] [place] [address] Phone: [phone] Fax: [phone]     Social Determinants of Health (SDOH) Social History: SDOH Screenings   Tobacco Use: Medium Risk (03/31/2023)   SDOH Interventions:     Readmission Risk Interventions     No data to  display

## 2023-04-01 NOTE — Plan of Care (Signed)
  Problem: Activity: Goal: Range of joint motion will improve Outcome: Progressing   Problem: Clinical Measurements: Goal: Postoperative complications will be avoided or minimized Outcome: Progressing   Problem: Skin Integrity: Goal: Will show signs of wound healing Outcome: Progressing   Problem: Health Behavior/Discharge Planning: Goal: Ability to identify and utilize available resources and services will improve Outcome: Progressing   Problem: Metabolic: Goal: Ability to maintain appropriate glucose levels will improve Outcome: Progressing   Problem: Skin Integrity: Goal: Risk for impaired skin integrity will decrease Outcome: Progressing   Problem: Clinical Measurements: Goal: Ability to maintain clinical measurements within normal limits will improve Outcome: Progressing

## 2023-04-01 NOTE — Progress Notes (Signed)
     Subjective:  Patient reports pain as mild.  Slept some overnight. Sitting at bedside this morning. Has not yet worked with PT. Patient found to have Mobitz Type I, discussed with Dr. Renold Don who felt to be normal benign variant. Recommended tele monitoring post op. Patient asymptomatic. Will see how she does with PT today possible discharge home.   Objective:   VITALS:   Vitals:   03/31/23 1615 03/31/23 1641 03/31/23 2021 04/01/23 0009  BP: (!) 144/72 137/63 120/65 133/73  Pulse: 68 91 97 83  Resp: 17 18 17 18   Temp:  97.8 F (36.6 C) 98.5 F (36.9 C) 98.4 F (36.9 C)  TempSrc:  Oral Oral Oral  SpO2: 100% 100% 99% 97%  Weight:        Sensation intact distally Intact pulses distally Dorsiflexion/Plantar flexion intact Incision: dressing C/D/I Compartment soft    Lab Results  Component Value Date   WBC 11.2 (H) 04/01/2023   HGB 13.4 04/01/2023   HCT 40.3 04/01/2023   MCV 89.0 04/01/2023   PLT 182 04/01/2023   BMET    Component Value Date/Time   NA 135 04/01/2023 0428   K 4.2 04/01/2023 0428   CL 105 04/01/2023 0428   CO2 22 04/01/2023 0428   GLUCOSE 168 (H) 04/01/2023 0428   BUN 16 04/01/2023 0428   CREATININE 1.54 (H) 04/01/2023 0428   CALCIUM 9.8 04/01/2023 0428   GFRNONAA 34 (L) 04/01/2023 0428      Xray: TKA components in good position, no adverse features  Assessment/Plan: 1 Day Post-Op   Principal Problem:   Primary osteoarthritis of right knee  S/p R TKA 03/31/23  Post op recs: WB: WBAT Abx: ancef Imaging: PACU xrays DVT prophylaxis: Aspirin 81mg  BID x4 weeks Follow up: 2 weeks after surgery for a wound check with Dr. Blanchie Dessert at [facility].  Address: [address], [place], [place] [address]  Office Phone: [phone]   Joen Laura 04/01/2023, 6:58 AM   Weber Cooks, MD  Contact information:   [phone] 7am-5pm epic message Dr. Blanchie Dessert, or call office for patient follow up: [phone]  [phone] After hours and holidays please check [email or web address] for group call information for [facility]

## 2023-04-01 NOTE — Progress Notes (Signed)
Physical Therapy Treatment Patient Details Name: Joan Torres MRN: [id number] DOB: Apr 15, 1946 Today's Date: 04/01/2023   History of Present Illness Pt is 77 yo female admitted on 03/31/23 for R TKA.  Pt found to have Mobitz Type 1 - felt to be normal benign variant but on telemetry post op.  Pt with hx including but not limited to arthritis, DM, HTN, spondylolisthesis L4/5    PT Comments  Pt making some progress this afternoon but was limited by dizziness and pain(had received IV pain meds prior to session).  She did ambulate 48' with  CGA and min A for transfers.  Pt tolerating exercises well with AAROM and limited ROM.  Do recommend further therapy prior to safe d/c home.  Expect pt to progress well tomorrow.    If plan is discharge home, recommend the following: A little help with walking and/or transfers;A little help with bathing/dressing/bathroom;Assistance with cooking/housework;Help with stairs or ramp for entrance   Can travel by private vehicle        Equipment Recommendations  Rolling walker (2 wheels)    Recommendations for Other Services       Precautions / Restrictions Precautions Precautions: Fall;Knee Restrictions Weight Bearing Restrictions: Yes RLE Weight Bearing: Weight bearing as tolerated     Mobility  Bed Mobility Overal bed mobility: Needs Assistance Bed Mobility: Supine to Sit     Supine to sit: Min assist     General bed mobility comments: in chiar    Transfers Overall transfer level: Needs assistance Equipment used: Rolling walker (2 wheels) Transfers: Sit to/from Stand Sit to Stand: Min assist   Step pivot transfers: Min assist       General transfer comment: Performed STS from recliner x 2 with good hand placement and min A to rise; cues to turn completely before sitting    Ambulation/Gait Ambulation/Gait assistance: Contact guard assist Gait Distance (Feet): 20 Feet Assistive device: Rolling walker (2 wheels) Gait  Pattern/deviations: Step-to pattern, Decreased stride length, Decreased weight shift to right Gait velocity: decreased     General Gait Details: Cues for RW proximity and posture   Stairs             Wheelchair Mobility     Tilt Bed    Modified Rankin (Stroke Patients Only)       Balance Overall balance assessment: Needs assistance Sitting-balance support: No upper extremity supported Sitting balance-Leahy Scale: Good     Standing balance support: Bilateral upper extremity supported, Reliant on assistive device for balance Standing balance-Leahy Scale: Poor Standing balance comment: RW                            Cognition Arousal: Alert Behavior During Therapy: WFL for tasks assessed/performed Overall Cognitive Status: Within Functional Limits for tasks assessed                                          Exercises Total Joint Exercises Ankle Circles/Pumps: AROM, Both, 10 reps, Supine Quad Sets: AROM, Both, 10 reps, Supine (tactile cues) Heel Slides: AAROM, Right, 10 reps, Supine Hip ABduction/ADduction: AAROM, Right, 10 reps, Supine Long Arc Quad: AAROM, Right, 5 reps, Seated Knee Flexion: AAROM, Right, 10 reps, Seated Goniometric ROM: R knee ~5 to 80 degrees    General Comments General comments (skin integrity, edema, etc.): Pt had received oral and  IV pain meds prior to session.  Reports pain improved but reports some dizziness (had dilaudid ~1 hr prior). VSS (BP 130's/70's)      Pertinent Vitals/Pain Pain Assessment Pain Assessment: 0-10 Pain Score: 8  Pain Location: R knee Pain Descriptors / Indicators: Discomfort Pain Intervention(s): Limited activity within patient's tolerance, Monitored during session, Premedicated before session, Repositioned    Home Living Family/patient expects to be discharged to:: Private residence Living Arrangements: Spouse/significant other (fiancee and sister) Available Help at Discharge:  Family;Available 24 hours/day Type of Home: House Home Access: Stairs to enter Entrance Stairs-Rails: Right Entrance Stairs-Number of Steps: 3   Home Layout: One level Home Equipment: Cane - single point      Prior Function            PT Goals (current goals can now be found in the care plan section) Acute Rehab PT Goals Patient Stated Goal: decrease pain; return home PT Goal Formulation: With patient Time For Goal Achievement: 04/15/23 Potential to Achieve Goals: Good Progress towards PT goals: Progressing toward goals    Frequency    7X/week      PT Plan      Co-evaluation              AM-PAC PT "6 Clicks" Mobility   Outcome Measure  Help needed turning from your back to your side while in a flat bed without using bedrails?: A Little Help needed moving from lying on your back to sitting on the side of a flat bed without using bedrails?: A Little Help needed moving to and from a bed to a chair (including a wheelchair)?: A Little Help needed standing up from a chair using your arms (e.g., wheelchair or bedside chair)?: A Little Help needed to walk in hospital room?: A Little Help needed climbing 3-5 steps with a railing? : A Lot 6 Click Score: 17    End of Session Equipment Utilized During Treatment: Gait belt Activity Tolerance: Patient limited by pain Patient left: with chair alarm set;in chair;with call bell/phone within reach Nurse Communication: Mobility status PT Visit Diagnosis: Other abnormalities of gait and mobility (R26.89);Muscle weakness (generalized) (M62.81)     Time: 5621-3086 PT Time Calculation (min) (ACUTE ONLY): 24 min  Charges:    $Gait Training: 8-22 mins $Therapeutic Exercise: 8-22 mins PT General Charges $$ ACUTE PT VISIT: 1 Visit                     Anise Salvo, PT Acute Rehab Services [facility] [phone]    Rayetta Humphrey 04/01/2023, 3:02 PM

## 2023-04-01 NOTE — Evaluation (Signed)
Physical Therapy Evaluation Patient Details Name: Joan Torres MRN: [id number] DOB: Jul 28, 1945 Today's Date: 04/01/2023  History of Present Illness  Pt is 77 yo female admitted on 03/31/23 for R TKA.  Pt found to have Mobitz Type 1 - felt to be normal benign variant but on telemetry post op.  Pt with hx including but not limited to arthritis, DM, HTN, spondylolisthesis L4/5  Clinical Impression  Pt is s/p TKA resulting in the deficits listed below (see PT Problem List). At baseline, pt is independent.  She was using a cane for community ambulation.  Pt has home support at d/c.  Today, pt was limited by pain.  PT messaged RN about pain meds prior to session; however, pt not having pain at that time and declined.  Pt's pain increasing at time of eval and increased further with activity.  Pt only able to transfer to bsc then chair with Min A.  Educated pt on importance of taking pain meds, keeping pain under control to allow for mobility -she agrees.  Will f/u in afternoon; however, suspect pt will need further therapy prior to return home.  Pt will benefit from acute skilled PT to increase their independence and safety with mobility to allow discharge.          If plan is discharge home, recommend the following: A little help with walking and/or transfers;A little help with bathing/dressing/bathroom;Assistance with cooking/housework;Help with stairs or ramp for entrance   Can travel by private vehicle        Equipment Recommendations Rolling walker (2 wheels)  Recommendations for Other Services       Functional Status Assessment Patient has had a recent decline in their functional status and demonstrates the ability to make significant improvements in function in a reasonable and predictable amount of time.     Precautions / Restrictions Precautions Precautions: Fall;Knee Restrictions Weight Bearing Restrictions: Yes RLE Weight Bearing: Weight bearing as tolerated      Mobility   Bed Mobility Overal bed mobility: Needs Assistance Bed Mobility: Supine to Sit     Supine to sit: Min assist          Transfers Overall transfer level: Needs assistance Equipment used: Rolling walker (2 wheels) Transfers: Sit to/from Stand, Bed to chair/wheelchair/BSC Sit to Stand: Min assist   Step pivot transfers: Min assist       General transfer comment: Pt performing STS from bed and BSC with cues for hand placement, increased effort and time to rise, and min A.  Performed step pivot to [facility] then to recliner requiring frequent cues for keeping hands of RW, RW proximity, and turning completely before sitting. Limited by pain    Ambulation/Gait   Gait Distance (Feet): 2 Feet           General Gait Details: Only steps for transfer to bsc and then recliner; limited by pain  Stairs            Wheelchair Mobility     Tilt Bed    Modified Rankin (Stroke Patients Only)       Balance Overall balance assessment: Needs assistance Sitting-balance support: No upper extremity supported Sitting balance-Leahy Scale: Good     Standing balance support: Bilateral upper extremity supported, Reliant on assistive device for balance Standing balance-Leahy Scale: Poor Standing balance comment: RW and min A  Pertinent Vitals/Pain Pain Assessment Pain Assessment: 0-10 Pain Score: 7  Pain Location: R knee Pain Descriptors / Indicators: Discomfort Pain Intervention(s): Monitored during session, Ice applied, Repositioned, Patient requesting pain meds-RN notified, Limited activity within patient's tolerance (PT requested pt be premedicated but pt declined meds)    Home Living Family/patient expects to be discharged to:: Private residence Living Arrangements: Spouse/significant other (fiancee and sister) Available Help at Discharge: Family;Available 24 hours/day Type of Home: House Home Access: Stairs to enter Entrance  Stairs-Rails: Right Entrance Stairs-Number of Steps: 3   Home Layout: One level Home Equipment: Cane - single point      Prior Function Prior Level of Function : Driving;Independent/Modified Independent             Mobility Comments: Ambulates in home without AD; cane in community ADLs Comments: Reports independent adls; could do iadls but painful     Extremity/Trunk Assessment   Upper Extremity Assessment Upper Extremity Assessment: Overall WFL for tasks assessed    Lower Extremity Assessment Lower Extremity Assessment: LLE deficits/detail;RLE deficits/detail RLE Deficits / Details: Expected post op changes; ROM knee ~5 to 80 degrees; MMT : ankle 5/5, knee 2/5, hip 2/5 LLE Deficits / Details: ROM WFL, MMT 5/5    Cervical / Trunk Assessment Cervical / Trunk Assessment: Normal  Communication      Cognition Arousal: Alert Behavior During Therapy: WFL for tasks assessed/performed Overall Cognitive Status: Within Functional Limits for tasks assessed                                          General Comments General comments (skin integrity, edema, etc.): Requested pt to have pain meds via secure chat with RN prior to session.  RN responded pt had no pain sitting EOB and declined meds. Pt had increased pain with therapy and reports some at rest.  Discussed/educated pt on "staying on top of pain" and taking meds to allow for mobility - pt agrees.    Exercises     Assessment/Plan    PT Assessment Patient needs continued PT services  PT Problem List Decreased strength;Pain;Decreased range of motion;Decreased activity tolerance;Decreased knowledge of use of DME;Decreased balance;Decreased mobility;Decreased knowledge of precautions       PT Treatment Interventions DME instruction;Therapeutic exercise;Gait training;Balance training;Stair training;Functional mobility training;Therapeutic activities;Patient/family education;Modalities    PT Goals (Current  goals can be found in the Care Plan section)  Acute Rehab PT Goals Patient Stated Goal: decrease pain; return home PT Goal Formulation: With patient Time For Goal Achievement: 04/15/23 Potential to Achieve Goals: Good    Frequency 7X/week     Co-evaluation               AM-PAC PT "6 Clicks" Mobility  Outcome Measure Help needed turning from your back to your side while in a flat bed without using bedrails?: A Little Help needed moving from lying on your back to sitting on the side of a flat bed without using bedrails?: A Little Help needed moving to and from a bed to a chair (including a wheelchair)?: A Little Help needed standing up from a chair using your arms (e.g., wheelchair or bedside chair)?: A Little Help needed to walk in hospital room?: Total Help needed climbing 3-5 steps with a railing? : Total 6 Click Score: 14    End of Session Equipment Utilized During Treatment: Gait belt Activity Tolerance: Patient limited  by pain Patient left: with chair alarm set;in chair;with call bell/phone within reach   PT Visit Diagnosis: Other abnormalities of gait and mobility (R26.89);Muscle weakness (generalized) (M62.81)    Time: 8295-6213 PT Time Calculation (min) (ACUTE ONLY): 25 min   Charges:   PT Evaluation $PT Eval Low Complexity: 1 Low PT Treatments $Therapeutic Activity: 8-22 mins PT General Charges $$ ACUTE PT VISIT: 1 Visit         Anise Salvo, PT Acute Rehab Services [facility] [phone]   Rayetta Humphrey 04/01/2023, 11:59 AM

## 2023-04-02 LAB — GLUCOSE, CAPILLARY
Glucose-Capillary: 123 mg/dL — ABNORMAL HIGH (ref 70–99)
Glucose-Capillary: 121 mg/dL — ABNORMAL HIGH (ref 70–99)
Glucose-Capillary: 144 mg/dL — ABNORMAL HIGH (ref 70–99)
Glucose-Capillary: 188 mg/dL — ABNORMAL HIGH (ref 70–99)

## 2023-04-02 MED ORDER — PNEUMOCOCCAL 20-VAL CONJ VACC 0.5 ML IM SUSY: 0.5000 mL | PREFILLED_SYRINGE | INTRAMUSCULAR | Status: DC | PRN

## 2023-04-02 MED ORDER — ALUM & MAG HYDROXIDE-SIMETH 200-200-20 MG/5ML PO SUSP
15.0000 mL | Freq: Four times a day (QID) | ORAL | Status: DC | PRN
  Administered 2023-04-02: 15 mL via ORAL
  Filled 2023-04-02: qty 30

## 2023-04-02 MED ORDER — INFLUENZA VAC A&B SURF ANT ADJ 0.5 ML IM SUSY
0.5000 mL | PREFILLED_SYRINGE | INTRAMUSCULAR | Status: AC | PRN
  Administered 2023-04-05: 0.5 mL via INTRAMUSCULAR

## 2023-04-02 NOTE — Progress Notes (Signed)
Physical Therapy Treatment Patient Details Name: Joan Torres MRN: [id number] DOB: 11-Dec-1945 Today's Date: 04/02/2023   History of Present Illness Pt is 77 yo female admitted on 03/31/23 for R TKA.  Pt found to have Mobitz Type 1 - felt to be normal benign variant but on telemetry post op.  Pt with hx including but not limited to arthritis, DM, HTN, spondylolisthesis L4/5    PT Comments  Saw pt for PT this morning and unfortunately continues to be limited by pain.  She was only able to ambulate 15' with min A and mod/max cues for safety.  Pt had received oxycodone prior but not Robaxin - talked with nurse and will try adding robaxin for afternoon. Will f/u in afternoon; however, suspect pt will need further therapy tomorrow prior to return home.     If plan is discharge home, recommend the following: A little help with walking and/or transfers;A little help with bathing/dressing/bathroom;Assistance with cooking/housework;Help with stairs or ramp for entrance   Can travel by private vehicle        Equipment Recommendations  Rolling walker (2 wheels)    Recommendations for Other Services       Precautions / Restrictions Precautions Precautions: Fall;Knee Restrictions RLE Weight Bearing: Weight bearing as tolerated     Mobility  Bed Mobility               General bed mobility comments: in chair at arrival    Transfers Overall transfer level: Needs assistance Equipment used: Rolling walker (2 wheels) Transfers: Sit to/from Stand Sit to Stand: Min assist           General transfer comment: Increased time and effort due to pain; min A to rise; when returning to sit max cues to turn completely and reach back to sit    Ambulation/Gait Ambulation/Gait assistance: Min assist Gait Distance (Feet): 15 Feet Assistive device: Rolling walker (2 wheels) Gait Pattern/deviations: Step-to pattern, Decreased stride length, Decreased weight shift to right, Trunk flexed,  Antalgic Gait velocity: decreased     General Gait Details: Pt wtih c/o significant pain that limited.  Tried to encourage further ambulation or rest break and then walk more but pt too painful.  With ambulation pt with RW too far forward and frequently reaching for sink or chair - required max cues and assist to keep RW close and cues to use RW.   Stairs             Wheelchair Mobility     Tilt Bed    Modified Rankin (Stroke Patients Only)       Balance Overall balance assessment: Needs assistance Sitting-balance support: No upper extremity supported Sitting balance-Leahy Scale: Good     Standing balance support: Bilateral upper extremity supported, Reliant on assistive device for balance Standing balance-Leahy Scale: Poor Standing balance comment: RW and min A                            Cognition Arousal: Alert Behavior During Therapy: WFL for tasks assessed/performed Overall Cognitive Status: Within Functional Limits for tasks assessed                                          Exercises Total Joint Exercises Ankle Circles/Pumps: AROM, Both, 10 reps, Supine Quad Sets: AROM, Both, 10 reps, Supine Heel Slides: AAROM, Right,  Supine, 5 reps Hip ABduction/ADduction: AAROM, Right, Supine, 5 reps Long Arc Quad: AAROM, Right, 5 reps, Seated Knee Flexion: AAROM, Right, Seated, 10 reps Goniometric ROM: R knee ~5 to 70 degrees Other Exercises Other Exercises: All exercises to tolerance, provided AAROM and limited reps for pain control    General Comments General comments (skin integrity, edema, etc.): Spoke with RN - pt had received oxycodone 10 prior to PT but not Robaxin.  Reports will add Robaxin for afternoon.      Pertinent Vitals/Pain Pain Assessment Pain Assessment: 0-10 Pain Score: 8  Pain Location: R knee Pain Descriptors / Indicators: Discomfort, Grimacing, Guarding, Sore Pain Intervention(s): Limited activity within  patient's tolerance, Monitored during session, Premedicated before session, Ice applied, Repositioned    Home Living                          Prior Function            PT Goals (current goals can now be found in the care plan section) Progress towards PT goals: Progressing toward goals    Frequency    7X/week      PT Plan      Co-evaluation              AM-PAC PT "6 Clicks" Mobility   Outcome Measure  Help needed turning from your back to your side while in a flat bed without using bedrails?: A Little Help needed moving from lying on your back to sitting on the side of a flat bed without using bedrails?: A Little Help needed moving to and from a bed to a chair (including a wheelchair)?: A Little Help needed standing up from a chair using your arms (e.g., wheelchair or bedside chair)?: A Little Help needed to walk in hospital room?: A Lot Help needed climbing 3-5 steps with a railing? : A Lot 6 Click Score: 16    End of Session Equipment Utilized During Treatment: Gait belt Activity Tolerance: Patient limited by pain Patient left: with chair alarm set;in chair;with call bell/phone within reach Nurse Communication: Mobility status PT Visit Diagnosis: Other abnormalities of gait and mobility (R26.89);Muscle weakness (generalized) (M62.81)     Time: 9528-4132 PT Time Calculation (min) (ACUTE ONLY): 23 min  Charges:    $Gait Training: 8-22 mins $Therapeutic Exercise: 8-22 mins PT General Charges $$ ACUTE PT VISIT: 1 Visit                     Anise Salvo, PT Acute Rehab Services [facility] [phone]    Rayetta Humphrey 04/02/2023, 1:37 PM

## 2023-04-02 NOTE — Discharge Summary (Addendum)
Physician Discharge Summary  Patient ID: Joan Torres MRN: [id number] DOB/AGE: 18-Jul-1945 77 y.o.  Admit date: 03/31/2023 Discharge date: 04/04/2023  Admission Diagnoses:  Primary osteoarthritis of right knee  Discharge Diagnoses:  Principal Problem:   Primary osteoarthritis of right knee   Past Medical History:  Diagnosis Date   Arthritis    Chronic kidney disease    Diabetes mellitus without complication (HCC)    Hypertension    Hypothyroidism     Surgeries: Procedure(s): TOTAL KNEE ARTHROPLASTY on 03/31/2023   Consultants (if any):   Discharged Condition: Improved  Hospital Course: Joan Torres is an 77 y.o. female who was admitted 03/31/2023 with a diagnosis of Primary osteoarthritis of right knee and went to the operating room on 03/31/2023 and underwent the above named procedures.  Mild Mobitz Type 1 appreciated post-operatively, was monitored on tele.  Patient remained asymptomatic throughout encounter.  Some initial difficulty with mobilization post-operatively, which improved with physical therapy and pain management.  She was given perioperative antibiotics:  Anti-infectives (From admission, onward)    Start     Dose/Rate Route Frequency Ordered Stop   03/31/23 1730  ceFAZolin (ANCEF) IVPB 2g/100 mL premix        2 g 200 mL/hr over 30 Minutes Intravenous Every 6 hours 03/31/23 1641 04/01/23 0021   03/31/23 0830  ceFAZolin (ANCEF) IVPB 2g/100 mL premix        2 g 200 mL/hr over 30 Minutes Intravenous On call to O.R. 03/31/23 0815 03/31/23 1115     .  She was given sequential compression devices, early ambulation, and Aspirin for DVT prophylaxis.  She benefited maximally from the hospital stay and there were no complications.    Recent vital signs:  Vitals:   04/03/23 2045 04/04/23 0423  BP: 126/66 109/61  Pulse: 85 80  Resp: 20 16  Temp: 98.5 F (36.9 C) 98.1 F (36.7 C)  SpO2: 99% 96%    Recent laboratory studies:  Lab Results  Component  Value Date   HGB 10.7 (L) 04/02/2023   HGB 13.4 04/01/2023   HGB 12.6 03/21/2023   Lab Results  Component Value Date   WBC 14.0 (H) 04/02/2023   PLT 257 04/02/2023   No results found for: "INR" Lab Results  Component Value Date   NA 135 04/01/2023   K 4.2 04/01/2023   CL 105 04/01/2023   CO2 22 04/01/2023   BUN 16 04/01/2023   CREATININE 1.54 (H) 04/01/2023   GLUCOSE 168 (H) 04/01/2023    Discharge Medications:   Allergies as of 04/03/2023   No Known Allergies      Medication List     STOP taking these medications    acetaminophen 650 MG CR tablet Commonly known as: TYLENOL Replaced by: acetaminophen 500 MG tablet       TAKE these medications    acetaminophen 500 MG tablet Commonly known as: TYLENOL Take 2 tablets (1,000 mg total) by mouth every 8 (eight) hours as needed. Replaces: acetaminophen 650 MG CR tablet   antiseptic oral rinse Liqd 15 mLs by Mouth Rinse route as needed for dry mouth.   aspirin EC 81 MG tablet Take 1 tablet (81 mg total) by mouth 2 (two) times daily for 28 days. Swallow whole. What changed:  when to take this additional instructions   atorvastatin 40 MG tablet Commonly known as: LIPITOR Take 40 mg by mouth at bedtime.   Cartia XT 180 MG 24 hr capsule Generic drug: diltiazem Take 180  mg by mouth daily.   Colchicine 0.6 MG Caps Take 0.6-1.2 mg by mouth See admin instructions. Take 1.2 mg at the first sign of gout then continue to take 0.6 mg as needed for gout flare   ezetimibe 10 MG tablet Commonly known as: ZETIA Take 10 mg by mouth at bedtime.   Farxiga 10 MG Tabs tablet Generic drug: dapagliflozin propanediol Take 10 mg by mouth daily.   furosemide 20 MG tablet Commonly known as: LASIX Take 20 mg by mouth daily as needed for edema.   gabapentin 600 MG tablet Commonly known as: NEURONTIN Take 600 mg by mouth daily as needed (shingles).   glipiZIDE 10 MG tablet Commonly known as: GLUCOTROL Take 10 mg by mouth  2 (two) times daily.   ketorolac 0.5 % ophthalmic solution Commonly known as: ACULAR Place 1 drop into the left eye 3 (three) times daily. Until gone   levothyroxine 112 MCG tablet Commonly known as: SYNTHROID Take 112 mcg by mouth at bedtime.   methocarbamol 500 MG tablet Commonly known as: ROBAXIN Take 1 tablet (500 mg total) by mouth every 8 (eight) hours as needed for up to 10 days for muscle spasms.   moxifloxacin 0.5 % ophthalmic solution Commonly known as: VIGAMOX Place 1 drop into the left eye 3 (three) times daily. Until gone   omeprazole 40 MG capsule Commonly known as: PRILOSEC Take 1 capsule (40 mg total) by mouth daily for 28 days.   ondansetron 4 MG tablet Commonly known as: Zofran Take 1 tablet (4 mg total) by mouth every 8 (eight) hours as needed for up to 14 days for nausea or vomiting.   oxyCODONE 5 MG immediate release tablet Commonly known as: Roxicodone Take 1 tablet (5 mg total) by mouth every 4 (four) hours as needed for up to 7 days for severe pain or moderate pain.   Ozempic (0.25 or 0.5 MG/DOSE) 2 MG/1.5ML Sopn Generic drug: Semaglutide(0.25 or 0.5MG /DOS) Inject 0.5 mg into the skin every Wednesday.   polyethylene glycol 17 g packet Commonly known as: MiraLax Take 17 g by mouth daily.   potassium chloride SA 20 MEQ tablet Commonly known as: KLOR-CON M Take 20 mEq by mouth 2 (two) times daily.   prednisoLONE acetate 1 % ophthalmic suspension Commonly known as: PRED FORTE Place 1 drop into the left eye 3 (three) times daily. Until gone   VICKS VAPORUB EX Place 1 application into the nose 3 (three) times daily as needed (for nasal congestion.).        Diagnostic Studies: DG Knee Right Port  Result Date: 03/31/2023 CLINICAL DATA:  Postoperative right knee. EXAM: PORTABLE RIGHT KNEE - 1-2 VIEW COMPARISON:  Right knee radiographs 10/07/2009 FINDINGS: Interval total right knee arthroplasty. No perihardware lucency is seen to indicate hardware  failure or loosening. Expected postoperative changes including intra-articular air and anterior subcutaneous air. Mild chronic enthesopathic changes at the quadriceps and patellar tendon insertions on the patella. No acute fracture or dislocation. IMPRESSION: Interval total right knee arthroplasty without evidence of hardware failure or loosening. Electronically Signed   By: Neita Garnet M.D.   On: 03/31/2023 13:45    Disposition: Discharge disposition: 01-Home or Self Care       Discharge Instructions     Call MD / Call 911   Complete by: As directed    If you experience chest pain or shortness of breath, CALL 911 and be transported to the hospital emergency room.  If you develope a fever above  101 F, pus (white drainage) or increased drainage or redness at the wound, or calf pain, call your surgeon's office.   Constipation Prevention   Complete by: As directed    Drink plenty of fluids.  Prune juice may be helpful.  You may use a stool softener, such as Colace (over the counter) 100 mg twice a day.  Use MiraLax (over the counter) for constipation as needed.   Diet - low sodium heart healthy   Complete by: As directed    Do not put a pillow under the knee. Place it under the heel.   Complete by: As directed    Driving restrictions   Complete by: As directed    No driving for 4-6 weeks   Increase activity slowly as tolerated   Complete by: As directed    Post-operative opioid taper instructions:   Complete by: As directed    POST-OPERATIVE OPIOID TAPER INSTRUCTIONS: It is important to wean off of your opioid medication as soon as possible. If you do not need pain medication after your surgery it is ok to stop day one. Opioids include: Codeine, Hydrocodone(Norco, Vicodin), Oxycodone(Percocet, oxycontin) and hydromorphone amongst others.  Long term and even short term use of opiods can cause: Increased pain response Dependence Constipation Depression Respiratory depression And  more.  Withdrawal symptoms can include Flu like symptoms Nausea, vomiting And more Techniques to manage these symptoms Hydrate well Eat regular healthy meals Stay active Use relaxation techniques(deep breathing, meditating, yoga) Do Not substitute Alcohol to help with tapering If you have been on opioids for less than two weeks and do not have pain than it is ok to stop all together.  Plan to wean off of opioids This plan should start within one week post op of your joint replacement. Maintain the same interval or time between taking each dose and first decrease the dose.  Cut the total daily intake of opioids by one tablet each day Next start to increase the time between doses. The last dose that should be eliminated is the evening dose.      TED hose   Complete by: As directed    Use stockings (TED hose) for 2 weeks on both leg(s).  Then for 2 more weeks on the surgical leg.  You may remove them at night for sleeping.        Follow-up Information     Joen Laura, MD. Go on 04/15/2023.   Specialty: Orthopedic Surgery Why: Your appointment has been scheduled for 2:30 Contact information: [address] [place] [address] [phone]         [facility]. Go on 04/16/2023.   Why: Your outpatient physical therapy is scheduled for 1:00 Contact information: [facility] [address] [place]lene O[place] [address] [phone]         [facility] Follow up.   Why: HHPT will provide 6 Home PT visits prior to starting outpatient physical therapy Contact information: [address] [place] [address] [phone]         Juliette Alcide, MD Follow up in 1 week(s).   Specialty: Family Medicine Why: For re-evaluation following possible new benign arrhythmia, Mobitz Type I, seen during hospital visit. Contact information: [address] [place] [address] [phone]                     Discharge Instructions  INSTRUCTIONS AFTER JOINT REPLACEMENT   Remove items at home which could result in a fall. This includes throw rugs or furniture in walking pathways ICE to the affected joint every three hours while awake for 30 minutes at a time, for at least the first 3-5 days, and then as needed for pain and swelling.  Continue to use ice for pain and swelling. You may notice swelling that will progress down to the foot and ankle.  This is normal after surgery.  Elevate your leg when you are not up walking on it.   Continue to use the breathing machine you got in the hospital (incentive spirometer) which will help keep your temperature down.  It is common for your temperature to cycle up and down following surgery, especially at night when you are not up moving around and exerting yourself.  The breathing machine keeps your lungs expanded and your temperature down.   DIET:  As you were doing prior to hospitalization, we recommend a well-balanced diet.  DRESSING / WOUND CARE / SHOWERING  Keep the surgical dressing until follow up.  The dressing is water proof, so you can shower without any extra covering.  IF THE DRESSING FALLS OFF or the wound gets wet inside, change the dressing with sterile gauze.  Please use good hand washing techniques before changing the dressing.  Do not use any lotions or creams on the incision until instructed by your surgeon.    ACTIVITY  Increase activity slowly as tolerated, but follow the weight bearing instructions below.   No driving for 6 weeks or until further direction given by your physician.  You cannot drive while taking narcotics.  No lifting or carrying greater than 10 lbs. until further directed by your surgeon. Avoid periods of inactivity such as sitting longer than an hour when not asleep. This helps prevent blood clots.  You may return to work once you are authorized by your doctor.     WEIGHT BEARING   Weight bearing as  tolerated with assist device (walker, cane, etc) as directed, use it as long as suggested by your surgeon or therapist, typically at least 4-6 weeks.   EXERCISES  Results after joint replacement surgery are often greatly improved when you follow the exercise, range of motion and muscle strengthening exercises prescribed by your doctor. Safety measures are also important to protect the joint from further injury. Any time any of these exercises cause you to have increased pain or swelling, decrease what you are doing until you are comfortable again and then slowly increase them. If you have problems or questions, call your caregiver or physical therapist for advice.   Rehabilitation is important following a joint replacement. After just a few days of immobilization, the muscles of the leg can become weakened and shrink (atrophy).  These exercises are designed to build up the tone and strength of the thigh and leg muscles and to improve motion. Often times heat used for twenty to thirty minutes before working out will loosen up your tissues and help with improving the range of motion but do not use heat for the first two weeks following surgery (sometimes heat can increase post-operative swelling).   These exercises can be done on a training (exercise) mat, on the floor, on a table or on a bed. Use whatever works the best and is most comfortable for you.    Use music or television while you are exercising so that the exercises are a pleasant break in your  day. This will make your life better with the exercises acting as a break in your routine that you can look forward to.   Perform all exercises about fifteen times, three times per day or as directed.  You should exercise both the operative leg and the other leg as well.  Exercises include:   Quad Sets - Tighten up the muscle on the front of the thigh (Quad) and hold for 5-10 seconds.   Straight Leg Raises - With your knee straight (if you were given a  brace, keep it on), lift the leg to 60 degrees, hold for 3 seconds, and slowly lower the leg.  Perform this exercise against resistance later as your leg gets stronger.  Leg Slides: Lying on your back, slowly slide your foot toward your buttocks, bending your knee up off the floor (only go as far as is comfortable). Then slowly slide your foot back down until your leg is flat on the floor again.  Angel Wings: Lying on your back spread your legs to the side as far apart as you can without causing discomfort.  Hamstring Strength:  Lying on your back, push your heel against the floor with your leg straight by tightening up the muscles of your buttocks.  Repeat, but this time bend your knee to a comfortable angle, and push your heel against the floor.  You may put a pillow under the heel to make it more comfortable if necessary.   A rehabilitation program following joint replacement surgery can speed recovery and prevent re-injury in the future due to weakened muscles. Contact your doctor or a physical therapist for more information on knee rehabilitation.    CONSTIPATION  Constipation is defined medically as fewer than three stools per week and severe constipation as less than one stool per week.  Even if you have a regular bowel pattern at home, your normal regimen is likely to be disrupted due to multiple reasons following surgery.  Combination of anesthesia, postoperative narcotics, change in appetite and fluid intake all can affect your bowels.   YOU MUST use at least one of the following options; they are listed in order of increasing strength to get the job done.  They are all available over the counter, and you may need to use some, POSSIBLY even all of these options:    Drink plenty of fluids (prune juice may be helpful) and high fiber foods Colace 100 mg by mouth twice a day  Senokot for constipation as directed and as needed Dulcolax (bisacodyl), take with full glass of water  Miralax  (polyethylene glycol) once or twice a day as needed.  If you have tried all these things and are unable to have a bowel movement in the first 3-4 days after surgery call either your surgeon or your primary doctor.    If you experience loose stools or diarrhea, hold the medications until you stool forms back up.  If your symptoms do not get better within 1 week or if they get worse, check with your doctor.  If you experience "the worst abdominal pain ever" or develop nausea or vomiting, please contact the office immediately for further recommendations for treatment.   ITCHING:  If you experience itching with your medications, try taking only a single pain pill, or even half a pain pill at a time.  You can also use Benadryl over the counter for itching or also to help with sleep.   TED HOSE STOCKINGS:  Use stockings on both  legs until for at least 2 weeks or as directed by physician office. They may be removed at night for sleeping.  MEDICATIONS:  See your medication summary on the "After Visit Summary" that nursing will review with you.  You may have some home medications which will be placed on hold until you complete the course of blood thinner medication.  It is important for you to complete the blood thinner medication as prescribed.   Blood clot prevention (DVT Prophylaxis): After surgery you are at an increased risk for a blood clot. you were prescribed a blood thinner, Aspirin 81mg , to be taken twice daily for a total of 4 weeks from surgery to help reduce your risk of getting a blood clot. This will help prevent a blood clot. Signs of a pulmonary embolus (blood clot in the lungs) include sudden short of breath, feeling lightheaded or dizzy, chest pain with a deep breath, rapid pulse rapid breathing. Signs of a blood clot in your arms or legs include new unexplained swelling and cramping, warm, red or darkened skin around the painful area. Please call the office or 911 right away if these signs  or symptoms develop.  PRECAUTIONS:  If you experience chest pain or shortness of breath - call 911 immediately for transfer to the hospital emergency department.   If you develop a fever greater that 101 F, purulent drainage from wound, increased redness or drainage from wound, foul odor from the wound/dressing, or calf pain - CONTACT YOUR SURGEON.                                                   FOLLOW-UP APPOINTMENTS:  If you do not already have a post-op appointment, please call the office for an appointment to be seen by your surgeon.  Guidelines for how soon to be seen are listed in your "After Visit Summary", but are typically between 2-3 weeks after surgery.  Please also follow up with your PCP or cardiologist regarding the mild abnormal heart rhythm seen during your hospital visit.    OTHER INSTRUCTIONS:   Knee Replacement:  Do not place pillow under knee, focus on keeping the knee straight while resting. Place foam block, curve side up under heel at all times except when walking.  DO NOT modify, tear, cut, or change the foam block in any way.  POST-OPERATIVE OPIOID TAPER INSTRUCTIONS: It is important to wean off of your opioid medication as soon as possible. If you do not need pain medication after your surgery it is ok to stop day one. Opioids include: Codeine, Hydrocodone(Norco, Vicodin), Oxycodone(Percocet, oxycontin) and hydromorphone amongst others.  Long term and even short term use of opiods can cause: Increased pain response Dependence Constipation Depression Respiratory depression And more.  Withdrawal symptoms can include Flu like symptoms Nausea, vomiting And more Techniques to manage these symptoms Hydrate well Eat regular healthy meals Stay active Use relaxation techniques(deep breathing, meditating, yoga) Do Not substitute Alcohol to help with tapering If you have been on opioids for less than two weeks and do not have pain than it is ok to stop all  together.  Plan to wean off of opioids This plan should start within one week post op of your joint replacement. Maintain the same interval or time between taking each dose and first decrease the dose.  Cut the total  daily intake of opioids by one tablet each day Next start to increase the time between doses. The last dose that should be eliminated is the evening dose.   MAKE SURE YOU:  Understand these instructions.  Get help right away if you are not doing well or get worse.    Thank you for letting us be a part of your medical care team.  It is a privilege we respect greatly.  We hope these instructions will help you stay on track for a fast and full recovery!            Signed: Cecil Cobbs 04/04/2023, 6:58 AM

## 2023-04-02 NOTE — Plan of Care (Signed)
  Problem: Education: Goal: Knowledge of the prescribed therapeutic regimen will improve Outcome: Progressing Goal: Individualized Educational Video(s) Outcome: Progressing   Problem: Activity: Goal: Ability to avoid complications of mobility impairment will improve Outcome: Progressing   Problem: Pain Management: Goal: Pain level will decrease with appropriate interventions Outcome: Progressing   Problem: Skin Integrity: Goal: Will show signs of wound healing Outcome: Progressing   Problem: Coping: Goal: Ability to adjust to condition or change in health will improve Outcome: Progressing   Problem: Nutritional: Goal: Maintenance of adequate nutrition will improve Outcome: Progressing Goal: Progress toward achieving an optimal weight will improve Outcome: Progressing   Problem: Activity: Goal: Range of joint motion will improve Outcome: Not Progressing

## 2023-04-02 NOTE — Plan of Care (Signed)

## 2023-04-02 NOTE — TOC Transition Note (Signed)
Transition of Care [facility]) - CM/SW Discharge Note   Patient Details  Name: Joan Torres MRN: [id number] Date of Birth: [birth year]  Transition of Care [facility]) CM/SW Contact:  Darleene Cleaver, LCSW Phone Number: 04/02/2023, 12:47 PM   Clinical Narrative:     CSW confirmed with Amy at [facility] at home [phone], that they have received the referral for patient to receive home health PT.  Per Amy they were supposed to start seeing patient today, CSW updated her that patient is discharging today.  Amy requested that the DC summary be faxed to her at [phone].  Patient has received a rolling walker from [employer] already which she will be discharging with.  Patient will be discharging to significant other's home at [address] [place], [place] [address].  Patient's significant other will be transporting her home.  Patient will be going home with home health through [place].  TOC signing off please reconsult with any other TOC needs, home health agency has been notified of planned discharge.  CSW received confirmation that discharge summary fax was received at 12:26 pm.    Final next level of care: Home w Home Health Services Barriers to Discharge: Barriers Resolved   Patient Goals and CMS Choice CMS Medicare.gov Compare Post Acute Care list provided to:: Patient Choice offered to / list presented to : Patient  Discharge Placement  Patient will be going home with home health PT.                    Patient and family notified of of transfer: 04/02/23  Discharge Plan and Services Additional resources added to the After Visit Summary for       Post Acute Care Choice: Durable Medical Equipment          DME Arranged: Dan Humphreys rolling DME Agency: [employer] Date DME Agency Contacted:  (Prearranged at MD office)     HH Arranged: PT HH Agency: Other - See comment ([facility]) Date HH Agency Contacted: 04/02/23 Time HH  Agency Contacted: 1247 Representative spoke with at [facility] Agency: Amy  Social Determinants of Health (SDOH) Interventions SDOH Screenings   Food Insecurity: No Food Insecurity (04/01/2023)  Housing: Low Risk  (04/01/2023)  Transportation Needs: No Transportation Needs (04/01/2023)  Utilities: Not At Risk (04/01/2023)  Tobacco Use: Medium Risk (03/31/2023)     Readmission Risk Interventions     No data to display

## 2023-04-02 NOTE — Progress Notes (Signed)
    2 Days Post-Op Procedure(s) (LRB): TOTAL KNEE ARTHROPLASTY (Right)  Subjective:  Patient reports pain as mild.  Slept some overnight.  Sitting in chair this morning. Has not yet worked with PT today, though was able to walk 2 ft with PT yesterday.  Patient found to have Mobitz Type I, which was discussed with Dr. Renold Don who felt to be normal benign variant.  Recommended tele monitoring post op.  Patient still asymptomatic. Patient hopeful for more progress today.  No other complaints.  Objective:   VITALS:   Vitals:   04/01/23 0853 04/01/23 1211 04/01/23 2228 04/02/23 0600  BP: 136/67 104/86 (!) 142/75 130/71  Pulse: 85 70 61 61  Resp: 16 16 19 15   Temp: 98.3 F (36.8 C) 98.4 F (36.9 C) 98.1 F (36.7 C) 98 F (36.7 C)  TempSrc: Oral Oral Oral Oral  SpO2: 97% 99% 100% 99%  Weight:        AAOx4, sitting comfortably in chair Sensation intact distally Intact pulses distally Dorsiflexion/Plantar flexion intact Incision: dressing C/D/I Compartment soft Wiggles toes appropriately   Lab Results  Component Value Date   WBC 14.0 (H) 04/02/2023   HGB 10.7 (L) 04/02/2023   HCT 33.6 (L) 04/02/2023   MCV 93.3 04/02/2023   PLT 257 04/02/2023   BMET    Component Value Date/Time   NA 135 04/01/2023 0428   K 4.2 04/01/2023 0428   CL 105 04/01/2023 0428   CO2 22 04/01/2023 0428   GLUCOSE 168 (H) 04/01/2023 0428   BUN 16 04/01/2023 0428   CREATININE 1.54 (H) 04/01/2023 0428   CALCIUM 9.8 04/01/2023 0428   GFRNONAA 34 (L) 04/01/2023 0428      Xray: TKA components in good position, no adverse features  Assessment/Plan: 2 Days Post-Op   Principal Problem:   Primary osteoarthritis of right knee  S/p R TKA 03/31/23  Post op recs: WB: WBAT Abx: ancef Imaging: PACU xrays DVT prophylaxis: Aspirin 81mg  BID x4 weeks Follow up: 2 weeks after surgery for a wound check with Dr. Blanchie Dessert at [facility].  Address: [address],  [place], [place] [address]  Office Phone: [phone]   Cecil Cobbs 04/02/2023, 8:35 AM   Weber Cooks, MD  Contact information:   [phone] 7am-5pm epic message Dr. Blanchie Dessert, or call office for patient follow up: [phone] After hours and holidays please check [email or web address] for group call information for [facility]

## 2023-04-02 NOTE — Progress Notes (Signed)
Physical Therapy Treatment Patient Details Name: Joan Torres MRN: [id number] DOB: Sep 18, 1945 Today's Date: 04/02/2023   History of Present Illness Pt is 77 yo female admitted on 03/31/23 for R TKA.  Pt found to have Mobitz Type 1 - felt to be normal benign variant but on telemetry post op.  Pt with hx including but not limited to arthritis, DM, HTN, spondylolisthesis L4/5    PT Comments  Pt received up in chair. Oxy given a few hours ago and Robaxin just given to pt. However pt demonstrated functional progression with transfers and gait this pm. Pt required Min/ModA to stand from recliner and BSC to RW. Increased gait distance with RW, CGA, and cues for upright posture and sequencing 20'x1, 60'x1. Pt assisted back to bed and educated on positioning with towel under R heelcord to facilitate knee extension. Pt has 3 steps to enter her home. Will assess in am if appropriate.    If plan is discharge home, recommend the following: A little help with walking and/or transfers;A little help with bathing/dressing/bathroom;Assistance with cooking/housework;Help with stairs or ramp for entrance   Can travel by private vehicle        Equipment Recommendations  Rolling walker (2 wheels)    Recommendations for Other Services       Precautions / Restrictions Precautions Precautions: Fall;Knee Precaution Booklet Issued: Yes (comment) (Pt has received TKA exercise packet with review) Restrictions Weight Bearing Restrictions: Yes RLE Weight Bearing: Weight bearing as tolerated     Mobility  Bed Mobility Overal bed mobility: Needs Assistance Bed Mobility: Sit to Supine       Sit to supine: Min assist (to lift R LE up onto bed)        Transfers Overall transfer level: Needs assistance Equipment used: Rolling walker (2 wheels) Transfers: Sit to/from Stand Sit to Stand: Min assist, Mod assist           General transfer comment: Increased time and effort due to pain; min A to rise;  when returning to sit max cues to turn completely and reach back to sit    Ambulation/Gait Ambulation/Gait assistance: Contact guard assist Gait Distance (Feet): 60 Feet Assistive device: Rolling walker (2 wheels) Gait Pattern/deviations: Step-to pattern, Decreased stride length, Decreased weight shift to right, Trunk flexed, Antalgic Gait velocity: decreased     General Gait Details: I'm proved tolerance for gait training. 20'x1, 60'x1   Stairs             Wheelchair Mobility     Tilt Bed    Modified Rankin (Stroke Patients Only)       Balance Overall balance assessment: Needs assistance Sitting-balance support: No upper extremity supported Sitting balance-Leahy Scale: Good     Standing balance support: Bilateral upper extremity supported, Reliant on assistive device for balance Standing balance-Leahy Scale: Fair Standing balance comment: RW for mobility                            Cognition Arousal: Alert Behavior During Therapy: WFL for tasks assessed/performed Overall Cognitive Status: Within Functional Limits for tasks assessed                                          Exercises Total Joint Exercises Ankle Circles/Pumps: AROM, Both, 10 reps Long Arc Quad: AROM, Both, 10 reps, Seated Other Exercises Other  Exercises: Pt educated on importance of functional progression, increasing gait distance, proper transfer techniques, and sequencing during gait    General Comments General comments (skin integrity, edema, etc.): Spoke with RN - pt had received oxycodone 10 prior to PT but not Robaxin.  Reports will add Robaxin for afternoon.      Pertinent Vitals/Pain Pain Assessment Pain Assessment: 0-10 Pain Score: 7  Pain Location: R knee Pain Descriptors / Indicators: Discomfort, Grimacing, Guarding, Sore Pain Intervention(s): Premedicated before session    Home Living                          Prior Function             PT Goals (current goals can now be found in the care plan section) Acute Rehab PT Goals Patient Stated Goal: decrease pain; return home Progress towards PT goals: Progressing toward goals    Frequency    7X/week      PT Plan      Co-evaluation              AM-PAC PT "6 Clicks" Mobility   Outcome Measure  Help needed turning from your back to your side while in a flat bed without using bedrails?: A Little Help needed moving from lying on your back to sitting on the side of a flat bed without using bedrails?: A Little Help needed moving to and from a bed to a chair (including a wheelchair)?: A Little Help needed standing up from a chair using your arms (e.g., wheelchair or bedside chair)?: A Little Help needed to walk in hospital room?: A Lot Help needed climbing 3-5 steps with a railing? : A Lot 6 Click Score: 16    End of Session Equipment Utilized During Treatment: Gait belt Activity Tolerance: Patient limited by pain Patient left: with chair alarm set;in chair;with call bell/phone within reach Nurse Communication: Mobility status PT Visit Diagnosis: Other abnormalities of gait and mobility (R26.89);Muscle weakness (generalized) (M62.81)     Time: 1610-9604 PT Time Calculation (min) (ACUTE ONLY): 29 min  Charges:    $Gait Training: 8-22 mins $Therapeutic Exercise: 8-22 mins PT General Charges $$ ACUTE PT VISIT: 1 Visit                    Zadie Cleverly, PTA  Jannet Askew 04/02/2023, 4:58 PM

## 2023-04-02 NOTE — Plan of Care (Signed)
  Problem: Education: Goal: Knowledge of the prescribed therapeutic regimen will improve Outcome: Progressing   Problem: Activity: Goal: Ability to avoid complications of mobility impairment will improve Outcome: Progressing Goal: Range of joint motion will improve Outcome: Progressing   Problem: Clinical Measurements: Goal: Postoperative complications will be avoided or minimized Outcome: Progressing   Problem: Pain Management: Goal: Pain level will decrease with appropriate interventions Outcome: Progressing   Problem: Skin Integrity: Goal: Will show signs of wound healing Outcome: Progressing   Problem: Coping: Goal: Ability to adjust to condition or change in health will improve Outcome: Progressing   Problem: Fluid Volume: Goal: Ability to maintain a balanced intake and output will improve Outcome: Progressing

## 2023-04-03 ENCOUNTER — Encounter (HOSPITAL_COMMUNITY): Payer: Self-pay | Admitting: Orthopedic Surgery

## 2023-04-03 LAB — GLUCOSE, CAPILLARY
Glucose-Capillary: 98 mg/dL (ref 70–99)
Glucose-Capillary: 134 mg/dL — ABNORMAL HIGH (ref 70–99)
Glucose-Capillary: 149 mg/dL — ABNORMAL HIGH (ref 70–99)
Glucose-Capillary: 109 mg/dL — ABNORMAL HIGH (ref 70–99)

## 2023-04-03 NOTE — Progress Notes (Signed)
Physical Therapy Treatment Patient Details Name: Joan Torres MRN: [id number] DOB: 02-Jul-1946 Today's Date: 04/03/2023   History of Present Illness Pt is 77 yo female admitted on 03/31/23 for R TKA.  Pt found to have Mobitz Type 1 - felt to be normal benign variant but on telemetry post op.  Pt with hx including but not limited to arthritis, DM, HTN, spondylolisthesis L4/5    PT Comments  The  patient demonstrates improved gait stability and safety, improved posture.  Patient practiced  2 Steps with  right  rail and left cane, much improved  with less support. Cues for sequence. Patient will be able to Dc home tomorrow after 1 PT visit, most likely.  Patient reports that nursing assisting her to ambulate to BR.    If plan is discharge home, recommend the following: A little help with walking and/or transfers;A little help with bathing/dressing/bathroom;Assistance with cooking/housework;Help with stairs or ramp for entrance   Can travel by private vehicle        Equipment Recommendations  None recommended by PT    Recommendations for Other Services       Precautions / Restrictions Precautions Precautions: Fall;Knee Restrictions RLE Weight Bearing: Weight bearing as tolerated     Mobility  Bed Mobility               General bed mobility comments: in chair at arrival    Transfers Overall transfer level: Needs assistance Equipment used: Rolling walker (2 wheels) Transfers: Sit to/from Stand Sit to Stand: Contact guard assist           General transfer comment: Increased time and effort  cues to push from recliner, leans forward while rising.;    Ambulation/Gait Ambulation/Gait assistance: Contact guard assist Gait Distance (Feet): 40 Feet Assistive device: Rolling walker (2 wheels) Gait Pattern/deviations: Step-to pattern, Step-through pattern Gait velocity: decreased     General Gait Details: improved step length, improved posture   Stairs Stairs:  Yes Stairs assistance: Mod assist, +2 safety/equipment Stair Management: One rail Right, Forwards, With cane Number of Stairs: 2 General stair comments: cues for sequence, pt. recalled sequence, improved  using cane  on Left and right rail.   Wheelchair Mobility     Tilt Bed    Modified Rankin (Stroke Patients Only)       Balance Overall balance assessment: Needs assistance Sitting-balance support: No upper extremity supported Sitting balance-Leahy Scale: Good     Standing balance support: Bilateral upper extremity supported, Reliant on assistive device for balance, During functional activity Standing balance-Leahy Scale: Fair Standing balance comment: RW                            Cognition Arousal: Alert Behavior During Therapy: WFL for tasks assessed/performed                                            Exercises   General Comments        Pertinent Vitals/Pain Pain Assessment Pain Score: 6  Pain Location: R knee Pain Descriptors / Indicators: Discomfort, Grimacing, Guarding, Sore Pain Intervention(s): Monitored during session, Premedicated before session    Home Living                          Prior Function  PT Goals (current goals can now be found in the care plan section) Progress towards PT goals: Progressing toward goals    Frequency    7X/week      PT Plan      Co-evaluation              AM-PAC PT "6 Clicks" Mobility   Outcome Measure  Help needed turning from your back to your side while in a flat bed without using bedrails?: A Little Help needed moving from lying on your back to sitting on the side of a flat bed without using bedrails?: A Little Help needed moving to and from a bed to a chair (including a wheelchair)?: A Little Help needed standing up from a chair using your arms (e.g., wheelchair or bedside chair)?: A Little Help needed to walk in hospital room?: A Little Help  needed climbing 3-5 steps with a railing? : A Little 6 Click Score: 18    End of Session Equipment Utilized During Treatment: Gait belt Activity Tolerance: Patient tolerated treatment well Patient left: in chair;with call bell/phone within reach Nurse Communication: Mobility status PT Visit Diagnosis: Other abnormalities of gait and mobility (R26.89);Muscle weakness (generalized) (M62.81)     Time: 4098-1191 PT Time Calculation (min) (ACUTE ONLY): 31 min  Charges:    $Gait Training: 23-37 mins  PT General Charges $$ ACUTE PT VISIT: 1 Visit                     Joan Torres PT Acute Rehabilitation Services Office [phone] Weekend pager-[phone]    Joan Torres 04/03/2023, 3:50 PM

## 2023-04-03 NOTE — Plan of Care (Signed)

## 2023-04-03 NOTE — Anesthesia Postprocedure Evaluation (Signed)
Anesthesia Post Note  [name]  Procedure(s) Performed: TOTAL KNEE ARTHROPLASTY (Right: Knee)     Patient location during evaluation: PACU Anesthesia Type: Spinal Level of consciousness: sedated and patient cooperative Pain management: pain level controlled Vital Signs Assessment: post-procedure vital signs reviewed and stable Respiratory status: spontaneous breathing Cardiovascular status: stable Anesthetic complications: no   No notable events documented.  Last Vitals:  Vitals:   04/02/23 1954 04/03/23 0452  BP: 127/65 118/63  Pulse: 78 64  Resp: 19 16  Temp: 37.3 C 36.8 C  SpO2: 98% 97%    Last Pain:  Vitals:   04/03/23 0508  TempSrc:   PainSc: Asleep                 Lewie Loron

## 2023-04-03 NOTE — Progress Notes (Signed)
    3 Days Post-Op Procedure(s) (LRB): TOTAL KNEE ARTHROPLASTY (Right)  Subjective:  Patient reports pain as mild.  Slept some overnight.  Sitting in chair this morning. Walked 15 ft, then 60 ft with PT yesterday.  Patient found to have Mobitz Type I, which was discussed with Dr. Renold Don who felt to be normal benign variant.  Recommended tele monitoring post op.  Patient still asymptomatic. Likely DC today.  No other complaints.  Objective:   VITALS:   Vitals:   04/02/23 0600 04/02/23 1204 04/02/23 1954 04/03/23 0452  BP: 130/71 (!) 148/72 127/65 118/63  Pulse: 61 63 78 64  Resp: 15 16 19 16   Temp: 98 F (36.7 C) 98.2 F (36.8 C) 99.2 F (37.3 C) 98.3 F (36.8 C)  TempSrc: Oral Oral Oral Oral  SpO2: 99% 98% 98% 97%  Weight:        AAOx4, sitting comfortably in chair Sensation intact distally Intact pulses distally Dorsiflexion/Plantar flexion intact Incision: dressing C/D/I Compartment soft Wiggles toes appropriately   Lab Results  Component Value Date   WBC 14.0 (H) 04/02/2023   HGB 10.7 (L) 04/02/2023   HCT 33.6 (L) 04/02/2023   MCV 93.3 04/02/2023   PLT 257 04/02/2023   BMET    Component Value Date/Time   NA 135 04/01/2023 0428   K 4.2 04/01/2023 0428   CL 105 04/01/2023 0428   CO2 22 04/01/2023 0428   GLUCOSE 168 (H) 04/01/2023 0428   BUN 16 04/01/2023 0428   CREATININE 1.54 (H) 04/01/2023 0428   CALCIUM 9.8 04/01/2023 0428   GFRNONAA 34 (L) 04/01/2023 0428      Xray: TKA components in good position, no adverse features  Assessment/Plan: 3 Days Post-Op   Principal Problem:   Primary osteoarthritis of right knee  S/p R TKA 03/31/23  04/02/23 -- recommended to stay for another day by PT, difficulty with stairs UPDATE: 04/03/23 -- recommended to stay for another day again by PT, still difficulty with stairs, hopeful for DC tomorrow if does better today  Post op recs: WB: WBAT Abx: ancef Imaging: PACU xrays DVT prophylaxis: Aspirin 81mg  BID  x4 weeks DC: home with OPPT Follow up: 2 weeks after surgery for a wound check with Dr. Blanchie Dessert at [facility].  Address: [address], [place], [place] [address]  Office Phone: [phone]   Joan Torres 04/03/2023, 6:40 AM   Weber Cooks, MD  Contact information:   [phone] 7am-5pm epic message Dr. Blanchie Dessert, or call office for patient follow up: [phone] After hours and holidays please check [email or web address] for group call information for [facility]

## 2023-04-03 NOTE — Progress Notes (Signed)
Physical Therapy Treatment Patient Details Name: Joan Torres MRN: [id number] DOB: May 18, 1946 Today's Date: 04/03/2023   History of Present Illness Pt is 77 yo female admitted on 03/31/23 for R TKA.  Pt found to have Mobitz Type 1 - felt to be normal benign variant but on telemetry post op.  Pt with hx including but not limited to arthritis, DM, HTN, spondylolisthesis L4/5    PT Comments  The patient required mod support and 2 persons to safely  negotiate 2 steps. No family available to instruct. Patient  will benefit from further practice of gait and steps prior to DC. RN aware that PT recommends that patient   not Dc today in order to become safer with ambulation and steps.   If plan is discharge home, recommend the following: A little help with walking and/or transfers;A little help with bathing/dressing/bathroom;Assistance with cooking/housework;Help with stairs or ramp for entrance   Can travel by private vehicle        Equipment Recommendations  None recommended by PT    Recommendations for Other Services       Precautions / Restrictions Precautions Precautions: Fall;Knee Restrictions Weight Bearing Restrictions: No RLE Weight Bearing: Weight bearing as tolerated     Mobility  Bed Mobility               General bed mobility comments: in chair at arrival    Transfers Overall transfer level: Needs assistance Equipment used: Rolling walker (2 wheels) Transfers: Sit to/from Stand Sit to Stand: Contact guard assist           General transfer comment: Increased time and effort  cues to push from recliner, leans forward while rising.;    Ambulation/Gait Ambulation/Gait assistance: Min assist Gait Distance (Feet): 20 Feet Assistive device: Rolling walker (2 wheels) Gait Pattern/deviations: Step-to pattern, Decreased stride length, Trunk flexed, Antalgic Gait velocity: decreased     General Gait Details: limited ambulation to practice  steps   Stairs Stairs: Yes Stairs assistance: Mod assist, +2 safety/equipment Stair Management: One rail Right, Forwards, With cane Number of Stairs: 2 General stair comments: much cues to not use left rail, that she needs to use the cane, patient required mod support for  stability   Wheelchair Mobility     Tilt Bed    Modified Rankin (Stroke Patients Only)       Balance Overall balance assessment: Needs assistance Sitting-balance support: No upper extremity supported Sitting balance-Leahy Scale: Good     Standing balance support: Bilateral upper extremity supported, Reliant on assistive device for balance, During functional activity Standing balance-Leahy Scale: Fair Standing balance comment: RW                            Cognition Arousal: Alert                                              Exercises Total Joint Exercises Ankle Circles/Pumps: AROM, Both, 10 reps Quad Sets: AROM, Both, 10 reps, Supine Straight Leg Raises: AAROM, Right, 10 reps Long Arc Quad: AROM, Both, 10 reps, Seated Knee Flexion: AAROM, Right, Seated, 10 reps Goniometric ROM: -5-70 right  knee    General Comments        Pertinent Vitals/Pain Pain Assessment Pain Score: 6  Pain Location: R knee Pain Descriptors / Indicators: Discomfort, Grimacing,  Guarding, Sore Pain Intervention(s): Premedicated before session, Ice applied    Home Living                          Prior Function            PT Goals (current goals can now be found in the care plan section) Progress towards PT goals: Progressing toward goals    Frequency    7X/week      PT Plan      Co-evaluation              AM-PAC PT "6 Clicks" Mobility   Outcome Measure  Help needed turning from your back to your side while in a flat bed without using bedrails?: A Little Help needed moving from lying on your back to sitting on the side of a flat bed without using  bedrails?: A Little Help needed moving to and from a bed to a chair (including a wheelchair)?: A Little Help needed standing up from a chair using your arms (e.g., wheelchair or bedside chair)?: A Little Help needed to walk in hospital room?: A Lot Help needed climbing 3-5 steps with a railing? : A Lot 6 Click Score: 16    End of Session Equipment Utilized During Treatment: Gait belt Activity Tolerance: Patient limited by pain Patient left: in chair;with call bell/phone within reach Nurse Communication: Mobility status (patient not safe to  DC today) PT Visit Diagnosis: Other abnormalities of gait and mobility (R26.89);Muscle weakness (generalized) (M62.81)     Time: 0950-1008 PT Time Calculation (min) (ACUTE ONLY): 18 min  Charges:    $Gait Training: 8-22 mins $Therapeutic Exercise: 8-22 mins PT General Charges $$ ACUTE PT VISIT: 1 Visit                     Blanchard Kelch PT Acute Rehabilitation Services Office [phone] Weekend [phone]    Rada Hay 04/03/2023, 1:14 PM

## 2023-04-03 NOTE — Progress Notes (Signed)
Physical Therapy Treatment Patient Details Name: Joan Torres MRN: [id number] DOB: [birth year] Today's Date: 04/03/2023   History of Present Illness Pt is 77 yo female admitted on 03/31/23 for R TKA.  Pt found to have Mobitz Type 1 - felt to be normal benign variant but on telemetry post op.  Pt with hx including but not limited to arthritis, DM, HTN, spondylolisthesis L4/5    PT Comments  Patient struggling with ambulation this visit, reporting dizziness, limited to 20' Patient reports that caregiver is not going to be very   helpful due to medical issues  Themself. No family present(live in [place]). Will practice steps and determine if patient is deemed safe to DC to home today.    If plan is discharge home, recommend the following: A little help with walking and/or transfers;A little help with bathing/dressing/bathroom;Assistance with cooking/housework;Help with stairs or ramp for entrance   Can travel by private vehicle        Equipment Recommendations  None recommended by PT    Recommendations for Other Services       Precautions / Restrictions Precautions Precautions: Fall;Knee Restrictions Weight Bearing Restrictions: No RLE Weight Bearing: Weight bearing as tolerated     Mobility  Bed Mobility               General bed mobility comments: in chair at arrival    Transfers Overall transfer level: Needs assistance Equipment used: Rolling walker (2 wheels) Transfers: Sit to/from Stand Sit to Stand: Min assist           General transfer comment: Increased time and effort  cues to push from recliner, leans forward while rising.; min A to rise; when returning to sit max cues to turn completely and reach back to sit    Ambulation/Gait Ambulation/Gait assistance: Min assist Gait Distance (Feet): 20 Feet Assistive device: Rolling walker (2 wheels) Gait Pattern/deviations: Step-to pattern, Decreased stride length, Decreased weight shift to right, Trunk flexed,  Antalgic Gait velocity: decreased     General Gait Details: gait limited this visit, reporting pain. Trunk flexed, cues to try to stand more erect. patient reporting feeling dizzy. Chair brought up.   Stairs             Wheelchair Mobility     Tilt Bed    Modified Rankin (Stroke Patients Only)       Balance Overall balance assessment: Needs assistance Sitting-balance support: No upper extremity supported Sitting balance-Leahy Scale: Good     Standing balance support: Bilateral upper extremity supported, Reliant on assistive device for balance, During functional activity Standing balance-Leahy Scale: Fair Standing balance comment: RW                            Cognition Arousal: Alert                                              Exercises Total Joint Exercises Ankle Circles/Pumps: AROM, Both, 10 reps Quad Sets: AROM, Both, 10 reps, Supine Straight Leg Raises: AAROM, Right, 10 reps Long Arc Quad: AROM, Both, 10 reps, Seated Knee Flexion: AAROM, Right, Seated, 10 reps Goniometric ROM: -5-70 right  knee    General Comments        Pertinent Vitals/Pain Pain Assessment Pain Score: 6  Pain Location: R knee Pain Descriptors / Indicators:  Discomfort, Grimacing, Guarding, Sore Pain Intervention(s): Monitored during session, Premedicated before session    Home Living                          Prior Function            PT Goals (current goals can now be found in the care plan section) Progress towards PT goals: Progressing toward goals    Frequency    7X/week      PT Plan      Co-evaluation              AM-PAC PT "6 Clicks" Mobility   Outcome Measure  Help needed turning from your back to your side while in a flat bed without using bedrails?: A Little Help needed moving from lying on your back to sitting on the side of a flat bed without using bedrails?: A Little Help needed moving to and from a  bed to a chair (including a wheelchair)?: A Lot Help needed standing up from a chair using your arms (e.g., wheelchair or bedside chair)?: A Lot Help needed to walk in hospital room?: A Lot Help needed climbing 3-5 steps with a railing? : A Lot 6 Click Score: 14    End of Session Equipment Utilized During Treatment: Gait belt Activity Tolerance: Patient limited by pain Patient left: in chair;with call bell/phone within reach Nurse Communication: Mobility status PT Visit Diagnosis: Other abnormalities of gait and mobility (R26.89);Muscle weakness (generalized) (M62.81)     Time: 1610-9604 PT Time Calculation (min) (ACUTE ONLY): 32 min  Charges:    $Gait Training: 8-22 mins $Therapeutic Exercise: 8-22 mins PT General Charges $$ ACUTE PT VISIT: 1 Visit                     Blanchard Kelch PT Acute Rehabilitation Services Office [phone] Weekend [phone]    Rada Hay 04/03/2023, 1:03 PM

## 2023-04-04 LAB — GLUCOSE, CAPILLARY
Glucose-Capillary: 153 mg/dL — ABNORMAL HIGH (ref 70–99)
Glucose-Capillary: 121 mg/dL — ABNORMAL HIGH (ref 70–99)
Glucose-Capillary: 140 mg/dL — ABNORMAL HIGH (ref 70–99)
Glucose-Capillary: 136 mg/dL — ABNORMAL HIGH (ref 70–99)

## 2023-04-04 NOTE — Progress Notes (Signed)
Physical Therapy Treatment Patient Details Name: Joan Torres MRN: [id number] DOB: 04-11-1946 Today's Date: 04/04/2023   History of Present Illness Pt is 77 yo female admitted on 03/31/23 for R TKA.  Pt found to have Mobitz Type 1 - felt to be normal benign variant but on telemetry post op.  Pt with hx including but not limited to arthritis, DM, HTN, spondylolisthesis L4/5    PT Comments  Pt reporting pain and wooziness with ambulation and only able to ambulate in room this morning.  Pt returned to reclined and ice provided for right knee.  Pt agreeable to check back for second session since having difficulty with mobility and pain this morning.     If plan is discharge home, recommend the following: A little help with walking and/or transfers;A little help with bathing/dressing/bathroom;Assistance with cooking/housework;Help with stairs or ramp for entrance   Can travel by private vehicle        Equipment Recommendations  None recommended by PT    Recommendations for Other Services       Precautions / Restrictions Precautions Precautions: Fall;Knee Restrictions RLE Weight Bearing: Weight bearing as tolerated     Mobility  Bed Mobility               General bed mobility comments: in chair at arrival    Transfers Overall transfer level: Needs assistance Equipment used: Rolling walker (2 wheels) Transfers: Sit to/from Stand Sit to Stand: Min assist           General transfer comment: Increased time and effort, requires UE assist, leans forward while rising, required light assist due to pain    Ambulation/Gait Ambulation/Gait assistance: Contact guard assist Gait Distance (Feet): 22 Feet Assistive device: Rolling walker (2 wheels) Gait Pattern/deviations: Step-to pattern, Decreased stance time - right, Antalgic, Trunk flexed Gait velocity: decreased     General Gait Details: verbal cues for sequence and posture, step length; distance limited by pain and  "wooziness"   Stairs             Wheelchair Mobility     Tilt Bed    Modified Rankin (Stroke Patients Only)       Balance                                            Cognition Arousal: Alert Behavior During Therapy: WFL for tasks assessed/performed Overall Cognitive Status: Within Functional Limits for tasks assessed                                          Exercises      General Comments        Pertinent Vitals/Pain Pain Assessment Pain Assessment: 0-10 Pain Score: 5  Pain Location: R knee Pain Descriptors / Indicators: Discomfort, Grimacing, Guarding, Sore, Aching Pain Intervention(s): Monitored during session, Ice applied, Repositioned    Home Living                          Prior Function            PT Goals (current goals can now be found in the care plan section) Progress towards PT goals: Progressing toward goals    Frequency    7X/week  PT Plan      Co-evaluation              AM-PAC PT "6 Clicks" Mobility   Outcome Measure  Help needed turning from your back to your side while in a flat bed without using bedrails?: A Little Help needed moving from lying on your back to sitting on the side of a flat bed without using bedrails?: A Little Help needed moving to and from a bed to a chair (including a wheelchair)?: A Little Help needed standing up from a chair using your arms (e.g., wheelchair or bedside chair)?: A Little Help needed to walk in hospital room?: A Little Help needed climbing 3-5 steps with a railing? : A Lot 6 Click Score: 17    End of Session Equipment Utilized During Treatment: Gait belt Activity Tolerance: Patient limited by pain Patient left: in chair;with call bell/phone within reach Nurse Communication: Mobility status PT Visit Diagnosis: Other abnormalities of gait and mobility (R26.89);Muscle weakness (generalized) (M62.81)     Time: 9604-5409 PT  Time Calculation (min) (ACUTE ONLY): 16 min  Charges:    $Gait Training: 8-22 mins PT General Charges $$ ACUTE PT VISIT: 1 Visit                     Paulino Door, DPT Physical Therapist Acute Rehabilitation Services Office: [phone]    Kati L Payson 04/04/2023, 2:00 PM

## 2023-04-04 NOTE — Progress Notes (Signed)
    4 Days Post-Op Procedure(s) (LRB): TOTAL KNEE ARTHROPLASTY (Right)  Subjective:  Patient reports pain/soreness as mild.  Slept okay overnight. Walked 40 ft, improved with stair mobility as well.  Per PT note, likey DC today if does well with 1 session today.  Would like to use restroom and ice her leg.  Eager to go home.  Patient found to have Mobitz Type I initially post-op, which was discussed with Dr. Renold Don who felt to be normal benign variant.  Recommended tele monitoring post op.  Patient still asymptomatic. No other complaints.  Objective:   VITALS:   Vitals:   04/03/23 1028 04/03/23 1200 04/03/23 2045 04/04/23 0423  BP: (!) 141/61 (!) 147/68 126/66 109/61  Pulse:  71 85 80  Resp:  16 20 16   Temp:  98.5 F (36.9 C) 98.5 F (36.9 C) 98.1 F (36.7 C)  TempSrc:  Oral Oral Oral  SpO2:  100% 99% 96%  Weight:        AAOx4, sitting comfortably in bed Sensation intact distally Intact pulses distally Dorsiflexion/Plantar flexion intact Incision: dressing C/D/I Compartment soft Wiggles toes appropriately   Lab Results  Component Value Date   WBC 14.0 (H) 04/02/2023   HGB 10.7 (L) 04/02/2023   HCT 33.6 (L) 04/02/2023   MCV 93.3 04/02/2023   PLT 257 04/02/2023   BMET    Component Value Date/Time   NA 135 04/01/2023 0428   K 4.2 04/01/2023 0428   CL 105 04/01/2023 0428   CO2 22 04/01/2023 0428   GLUCOSE 168 (H) 04/01/2023 0428   BUN 16 04/01/2023 0428   CREATININE 1.54 (H) 04/01/2023 0428   CALCIUM 9.8 04/01/2023 0428   GFRNONAA 34 (L) 04/01/2023 0428      Xray: TKA components in good position, no adverse features  Assessment/Plan: 4 Days Post-Op   Principal Problem:   Primary osteoarthritis of right knee  S/p R TKA 03/31/23  04/02/23 -- recommended to stay for another day by PT, difficulty with stairs UPDATE: 04/03/23 -- recommended to stay for another day again by PT, still difficulty with stairs, hopeful for DC tomorrow if does better  today 04/04/23 -- per PT note, likely DC today if does well with stair mobility again today.  Pt eager to DC.  Post op recs: WB: WBAT Abx: ancef Imaging: PACU xrays DVT prophylaxis: Aspirin 81mg  BID x4 weeks DC: home with OPPT Follow up: 2 weeks after surgery for a wound check with Dr. Blan[facility] Dessert at [facility].  Address: [address], [place], [place] [address]  Office Phone: [phone]   Joan Torres 04/04/2023, 6:54 AM   Joan Cooks, MD  Contact information:   [phone] 7am-5pm epic message Dr. Blanchie Dessert, or call office for patient follow up: [phone] After hours and holidays please check [email or web address] for group call information for [facility]

## 2023-04-04 NOTE — Care Management Important Message (Signed)
Important Message  Patient Details IM Letter given. Name: SHELLYANN VEENSTRA MRN: [id number] Date of Birth: 11-25-1945   Medicare Important Message Given:  Yes     Caren Macadam 04/04/2023, 12:13 PM

## 2023-04-04 NOTE — Progress Notes (Signed)
Physical Therapy Treatment Patient Details Name: Joan Torres MRN: [id number] DOB: 1946-05-28 Today's Date: 04/04/2023   History of Present Illness Pt is 77 yo female admitted on 03/31/23 for R TKA.  Pt found to have Mobitz Type 1 - felt to be normal benign variant but on telemetry post op.  Pt with hx including but not limited to arthritis, DM, HTN, spondylolisthesis L4/5    PT Comments  Pt ambulated in room to/from bathroom, felt unable to tolerate farther distance at this time.  Pt assisted with performing LE exercises.  Pt does not feel ready to d/c home today.  Plan to practice steps tomorrow (pt felt unable to perform today) in the morning and then pt hopeful to d/c home (since spouse has a drive back home to [place]).  Pt encouraged to monitor her pain and call for medication as needed as well as ice to assist with pain control.    If plan is discharge home, recommend the following: A little help with walking and/or transfers;A little help with bathing/dressing/bathroom;Assistance with cooking/housework;Help with stairs or ramp for entrance   Can travel by private vehicle        Equipment Recommendations  None recommended by PT    Recommendations for Other Services       Precautions / Restrictions Precautions Precautions: Fall;Knee Restrictions RLE Weight Bearing: Weight bearing as tolerated     Mobility  Bed Mobility               General bed mobility comments: in chair at arrival    Transfers Overall transfer level: Needs assistance Equipment used: Rolling walker (2 wheels) Transfers: Sit to/from Stand Sit to Stand: Contact guard assist           General transfer comment: Increased time and effort, requires UE assist, leans forward while rising, assisted with positioning Rt LE (due to pain)    Ambulation/Gait Ambulation/Gait assistance: Contact guard assist Gait Distance (Feet): 28 Feet Assistive device: Rolling walker (2 wheels) Gait  Pattern/deviations: Step-to pattern, Decreased stance time - right, Antalgic, Trunk flexed Gait velocity: decreased     General Gait Details: verbal cues for sequence and posture, step length; distance limited by pain and "wooziness"   Stairs             Wheelchair Mobility     Tilt Bed    Modified Rankin (Stroke Patients Only)       Balance                                            Cognition Arousal: Alert Behavior During Therapy: WFL for tasks assessed/performed Overall Cognitive Status: Within Functional Limits for tasks assessed                                          Exercises Total Joint Exercises Ankle Circles/Pumps: AROM, Both, 10 reps Quad Sets: AROM, Both, 10 reps, Supine Heel Slides: AAROM, Right, Supine, 10 reps Hip ABduction/ADduction: AAROM, Right, Supine, 10 reps Straight Leg Raises: AAROM, Right, 10 reps, Supine    General Comments        Pertinent Vitals/Pain Pain Assessment Pain Assessment: 0-10 Pain Score: 6  Pain Location: R knee Pain Descriptors / Indicators: Discomfort, Grimacing, Guarding, Sore, Aching Pain Intervention(s): Repositioned, Monitored  during session, Patient requesting pain meds-RN notified    Home Living                          Prior Function            PT Goals (current goals can now be found in the care plan section) Progress towards PT goals: Progressing toward goals    Frequency    7X/week      PT Plan      Co-evaluation              AM-PAC PT "6 Clicks" Mobility   Outcome Measure  Help needed turning from your back to your side while in a flat bed without using bedrails?: A Little Help needed moving from lying on your back to sitting on the side of a flat bed without using bedrails?: A Little Help needed moving to and from a bed to a chair (including a wheelchair)?: A Little Help needed standing up from a chair using your arms (e.g.,  wheelchair or bedside chair)?: A Little Help needed to walk in hospital room?: A Little Help needed climbing 3-5 steps with a railing? : A Lot 6 Click Score: 17    End of Session Equipment Utilized During Treatment: Gait belt Activity Tolerance: Patient limited by pain Patient left: in chair;with call bell/phone within reach Nurse Communication: Mobility status PT Visit Diagnosis: Other abnormalities of gait and mobility (R26.89);Muscle weakness (generalized) (M62.81)     Time: 8657-8469 PT Time Calculation (min) (ACUTE ONLY): 20 min  Charges:    $Therapeutic Exercise: 8-22 mins PT General Charges $$ ACUTE PT VISIT: 1 Visit           Thomasene Mohair PT, DPT Physical Therapist Acute Rehabilitation Services Office: [phone]    Kati L Payson 04/04/2023, 2:04 PM

## 2023-04-05 LAB — GLUCOSE, CAPILLARY
Glucose-Capillary: 117 mg/dL — ABNORMAL HIGH (ref 70–99)
Glucose-Capillary: 131 mg/dL — ABNORMAL HIGH (ref 70–99)

## 2023-04-05 MED ORDER — SENNA 8.6 MG PO TABS: 1.0000 | ORAL_TABLET | Freq: Every day | ORAL | Status: DC | PRN

## 2023-04-05 MED ORDER — MAGNESIUM CITRATE PO SOLN: 1.0000 | Freq: Once | ORAL | Status: DC | PRN

## 2023-04-05 NOTE — Progress Notes (Signed)
Physical Therapy Treatment Patient Details Name: Joan Torres MRN: [id number] DOB: Apr 13, 1946 Today's Date: 04/05/2023   History of Present Illness Pt is 77 yo female admitted on 03/31/23 for R TKA.  Pt found to have Mobitz Type 1 - felt to be normal benign variant but on telemetry post op.  Pt with hx including but not limited to arthritis, DM, HTN, spondylolisthesis L4/5    PT Comments  Pt tolerated increased activity this session. She ambulated 49' with RW, no loss of balance, distance limited by "wooziness" (likely 2* pain meds). Stair training completed. Reviewed HEP. Emphasized importance of frequent mobility and adherence to HEP for a good outcome. Pt stated she feels ready to DC home. She is ready to DC from a PT standpoint.     If plan is discharge home, recommend the following: A little help with walking and/or transfers;A little help with bathing/dressing/bathroom;Assistance with cooking/housework;Help with stairs or ramp for entrance   Can travel by private vehicle        Equipment Recommendations  None recommended by PT    Recommendations for Other Services       Precautions / Restrictions Precautions Precautions: Fall;Knee Precaution Booklet Issued: Yes (comment) Precaution Comments: reviewed no pillow under knee Restrictions Weight Bearing Restrictions: No RLE Weight Bearing: Weight bearing as tolerated     Mobility  Bed Mobility               General bed mobility comments: in chair at arrival    Transfers Overall transfer level: Needs assistance Equipment used: Rolling walker (2 wheels) Transfers: Sit to/from Stand Sit to Stand: Contact guard assist           General transfer comment: VCs hand placement, increased time/effort but no physical assist needed    Ambulation/Gait Ambulation/Gait assistance: Contact guard assist Gait Distance (Feet): 50 Feet Assistive device: Rolling walker (2 wheels) Gait Pattern/deviations: Step-to pattern,  Decreased stance time - right, Antalgic, Trunk flexed Gait velocity: decreased     General Gait Details: steady, no loss of balance, VCs for posture, pt reported feeling some wooziness while walking   Stairs Stairs: Yes Stairs assistance: Contact guard assist Stair Management: One rail Right, Forwards, With cane Number of Stairs: 3 General stair comments: cues for sequence, using cane  on Left and right rail.   Wheelchair Mobility     Tilt Bed    Modified Rankin (Stroke Patients Only)       Balance Overall balance assessment: Needs assistance Sitting-balance support: No upper extremity supported Sitting balance-Leahy Scale: Good     Standing balance support: Bilateral upper extremity supported, Reliant on assistive device for balance, During functional activity Standing balance-Leahy Scale: Fair Standing balance comment: RW                            Cognition Arousal: Alert Behavior During Therapy: WFL for tasks assessed/performed, Anxious Overall Cognitive Status: Within Functional Limits for tasks assessed                                 General Comments: pt anxious about managing at home as her caregiver cannot lift; discussed her sleeping in recliner instead of bed and sponge bathing at kitchen sink so her caregiver does not have to lift her leg into bed nor carry water for bathing        Exercises Short arc quad  Right x 10 AROM supine SLR x 10 AAROM Right supine R hip abduction x 10 AROM supine    General Comments        Pertinent Vitals/Pain Pain Assessment Pain Score: 6  Pain Location: R knee Pain Descriptors / Indicators: Discomfort, Grimacing, Guarding, Sore, Aching Pain Intervention(s): Limited activity within patient's tolerance, Monitored during session, Premedicated before session, Ice applied    Home Living                          Prior Function            PT Goals (current goals can now be found  in the care plan section) Acute Rehab PT Goals Patient Stated Goal: decrease pain; return home PT Goal Formulation: With patient Time For Goal Achievement: 04/15/23 Potential to Achieve Goals: Good Progress towards PT goals: Progressing toward goals    Frequency    7X/week      PT Plan      Co-evaluation              AM-PAC PT "6 Clicks" Mobility   Outcome Measure  Help needed turning from your back to your side while in a flat bed without using bedrails?: None Help needed moving from lying on your back to sitting on the side of a flat bed without using bedrails?: A Little Help needed moving to and from a bed to a chair (including a wheelchair)?: A Little Help needed standing up from a chair using your arms (e.g., wheelchair or bedside chair)?: None Help needed to walk in hospital room?: None Help needed climbing 3-5 steps with a railing? : A Little 6 Click Score: 21    End of Session Equipment Utilized During Treatment: Gait belt Activity Tolerance: Patient tolerated treatment well Patient left: in chair;with call bell/phone within reach Nurse Communication: Mobility status PT Visit Diagnosis: Other abnormalities of gait and mobility (R26.89);Muscle weakness (generalized) (M62.81)     Time: 1308-6578 PT Time Calculation (min) (ACUTE ONLY): 23 min  Charges:    $Gait Training: 8-22 mins $Therapeutic Exercise: 8-22 mins PT General Charges $$ ACUTE PT VISIT: 1 Visit                     Tamala Ser PT 04/05/2023  Acute Rehabilitation Services  Office [phone]

## 2023-04-05 NOTE — Progress Notes (Signed)
Physical Therapy Treatment Patient Details Name: Joan Torres MRN: [id number] DOB: [birth year] Today's Date: 04/05/2023   History of Present Illness Pt is 77 yo female admitted on 03/31/23 for R TKA.  Pt found to have Mobitz Type 1 - felt to be normal benign variant but on telemetry post op.  Pt with hx including but not limited to arthritis, DM, HTN, spondylolisthesis L4/5    PT Comments  Pt is anxious about managing at home. She stated her caregiver cannot lift heavy items. We discussed strategies for avoiding need for CG to lift, pt can sleep in recliner rather than bed and she could wash up at the kitchen sink. Reviewed HEP. Pt refused pain medication prior to this session, she c/o 6/10 pain with exercises, then agreed to taking pain medication, RN notified. Will return for gait training and review of stair training. Emphasized importance of frequent mobility to facilitate a successful outcome with her TKA. Pt stated she's not had a BM in 6 days, RN notified.     If plan is discharge home, recommend the following: A little help with walking and/or transfers;A little help with bathing/dressing/bathroom;Assistance with cooking/housework;Help with stairs or ramp for entrance   Can travel by private vehicle        Equipment Recommendations  None recommended by PT    Recommendations for Other Services       Precautions / Restrictions Precautions Precautions: Fall;Knee Precaution Booklet Issued: Yes (comment) Precaution Comments: reviewed no pillow under knee Restrictions Weight Bearing Restrictions: No RLE Weight Bearing: Weight bearing as tolerated     Mobility  Bed Mobility                    Transfers                        Ambulation/Gait                   Stairs             Wheelchair Mobility     Tilt Bed    Modified Rankin (Stroke Patients Only)       Balance                                             Cognition Arousal: Alert Behavior During Therapy: WFL for tasks assessed/performed, Anxious Overall Cognitive Status: Within Functional Limits for tasks assessed                                 General Comments: pt anxious about managing at home as her caregiver cannot lift; discussed her sleeping in recliner instead of bed and sponge bathing at kitchen sink so her caregiver does not have to lift her leg into bed nor carry water for bathing        Exercises Total Joint Exercises Ankle Circles/Pumps: AROM, Both, 15 reps, Supine Quad Sets: AROM, Both, Supine, 5 reps Heel Slides: AAROM, Right, Supine, 10 reps Long Arc Quad: 10 reps, Seated, AAROM, Right Knee Flexion: AAROM, Right, Seated, 10 reps    General Comments        Pertinent Vitals/Pain Pain Assessment Pain Score: 6  Pain Location: R knee Pain Descriptors / Indicators: Discomfort, Grimacing, Guarding, Sore, Aching Pain Intervention(s): Limited activity within  patient's tolerance, Monitored during session, Patient requesting pain meds-RN notified, Ice applied, Repositioned    Home Living                          Prior Function            PT Goals (current goals can now be found in the care plan section) Acute Rehab PT Goals Patient Stated Goal: decrease pain; return home PT Goal Formulation: With patient Time For Goal Achievement: 04/15/23 Potential to Achieve Goals: Good Progress towards PT goals: Progressing toward goals    Frequency    7X/week      PT Plan      Co-evaluation              AM-PAC PT "6 Clicks" Mobility   Outcome Measure  Help needed turning from your back to your side while in a flat bed without using bedrails?: A Little Help needed moving from lying on your back to sitting on the side of a flat bed without using bedrails?: A Little Help needed moving to and from a bed to a chair (including a wheelchair)?: A Little Help needed standing up from a chair  using your arms (e.g., wheelchair or bedside chair)?: A Little Help needed to walk in hospital room?: A Little Help needed climbing 3-5 steps with a railing? : A Lot 6 Click Score: 17    End of Session   Activity Tolerance: Patient limited by pain Patient left: in chair;with call bell/phone within reach Nurse Communication: Mobility status PT Visit Diagnosis: Other abnormalities of gait and mobility (R26.89);Muscle weakness (generalized) (M62.81)     Time: 0865-7846 PT Time Calculation (min) (ACUTE ONLY): 22 min  Charges:    $Therapeutic Exercise: 8-22 mins PT General Charges $$ ACUTE PT VISIT: 1 Visit                     Tamala Ser PT 04/05/2023  Acute Rehabilitation Services  Office [phone]

## 2023-04-05 NOTE — Plan of Care (Signed)
  Problem: Education: Goal: Knowledge of the prescribed therapeutic regimen will improve Outcome: Progressing Goal: Individualized Educational Video(s) Outcome: Progressing   Problem: Activity: Goal: Ability to avoid complications of mobility impairment will improve Outcome: Progressing Goal: Range of joint motion will improve Outcome: Progressing   Problem: Clinical Measurements: Goal: Postoperative complications will be avoided or minimized Outcome: Progressing   Problem: Pain Management: Goal: Pain level will decrease with appropriate interventions Outcome: Progressing   Problem: Education: Goal: Ability to describe self-care measures that may prevent or decrease complications (Diabetes Survival Skills Education) will improve Outcome: Progressing Goal: Individualized Educational Video(s) Outcome: Progressing

## 2023-04-05 NOTE — TOC Transition Note (Signed)
Transition of Care [facility]) - CM/SW Discharge Note   Patient Details  Name: Joan Torres MRN: [id number] Date of Birth: January 27, 1946  [facility] CM/SW Contact:  Georgie Chard, LCSW Phone Number: 04/05/2023, 10:30 AM   Clinical Narrative:    CSW spoke with Judeth Cornfield at [facility]. AT this time they are aware that the patient will DC. DC summary will be re-faxed by this CSW. Patient will be transported by spouse. At this time there are no further TOC needs.    Final next level of care: Home w Home Health Services Barriers to Discharge: Barriers Resolved   Patient Goals and CMS Choice CMS Medicare.gov Compare Post Acute Care list provided to:: Patient Choice offered to / list presented to : Patient  Discharge Placement                      Patient and family notified of of transfer: 04/02/23  Discharge Plan and Services Additional resources added to the After Visit Summary for       Post Acute Care Choice: Durable Medical Equipment          DME Arranged: Dan Humphreys rolling DME Agency: [employer] Date DME Agency Contacted:  (Prearranged at MD office)     HH Arranged: PT HH Agency: Other - See comment ([facility]) Date HH Agency Contacted: 04/02/23 Time HH Agency Contacted: 1247 Representative spoke with at [facility] Agency: Amy  Social Determinants of Health (SDOH) Interventions SDOH Screenings   Food Insecurity: No Food Insecurity (04/01/2023)  Housing: Low Risk  (04/01/2023)  Transportation Needs: No Transportation Needs (04/01/2023)  Utilities: Not At Risk (04/01/2023)  Tobacco Use: Medium Risk (03/31/2023)     Readmission Risk Interventions     No data to display

## 2023-04-06 DIAGNOSIS — I129 Hypertensive chronic kidney disease with stage 1 through stage 4 chronic kidney disease, or unspecified chronic kidney disease: Secondary | ICD-10-CM | POA: Diagnosis not present

## 2023-04-06 DIAGNOSIS — N189 Chronic kidney disease, unspecified: Secondary | ICD-10-CM | POA: Diagnosis not present

## 2023-04-06 DIAGNOSIS — E1122 Type 2 diabetes mellitus with diabetic chronic kidney disease: Secondary | ICD-10-CM | POA: Diagnosis not present

## 2023-04-06 DIAGNOSIS — Z471 Aftercare following joint replacement surgery: Secondary | ICD-10-CM | POA: Diagnosis not present

## 2023-04-06 DIAGNOSIS — Z556 Problems related to health literacy: Secondary | ICD-10-CM | POA: Diagnosis not present

## 2023-04-06 DIAGNOSIS — E039 Hypothyroidism, unspecified: Secondary | ICD-10-CM | POA: Diagnosis not present

## 2023-04-08 DIAGNOSIS — Z556 Problems related to health literacy: Secondary | ICD-10-CM | POA: Diagnosis not present

## 2023-04-08 DIAGNOSIS — Z471 Aftercare following joint replacement surgery: Secondary | ICD-10-CM | POA: Diagnosis not present

## 2023-04-08 DIAGNOSIS — N189 Chronic kidney disease, unspecified: Secondary | ICD-10-CM | POA: Diagnosis not present

## 2023-04-08 DIAGNOSIS — E1122 Type 2 diabetes mellitus with diabetic chronic kidney disease: Secondary | ICD-10-CM | POA: Diagnosis not present

## 2023-04-08 DIAGNOSIS — I129 Hypertensive chronic kidney disease with stage 1 through stage 4 chronic kidney disease, or unspecified chronic kidney disease: Secondary | ICD-10-CM | POA: Diagnosis not present

## 2023-04-08 DIAGNOSIS — E039 Hypothyroidism, unspecified: Secondary | ICD-10-CM | POA: Diagnosis not present

## 2023-04-10 DIAGNOSIS — E1122 Type 2 diabetes mellitus with diabetic chronic kidney disease: Secondary | ICD-10-CM | POA: Diagnosis not present

## 2023-04-10 DIAGNOSIS — N189 Chronic kidney disease, unspecified: Secondary | ICD-10-CM | POA: Diagnosis not present

## 2023-04-10 DIAGNOSIS — E039 Hypothyroidism, unspecified: Secondary | ICD-10-CM | POA: Diagnosis not present

## 2023-04-10 DIAGNOSIS — Z556 Problems related to health literacy: Secondary | ICD-10-CM | POA: Diagnosis not present

## 2023-04-10 DIAGNOSIS — I129 Hypertensive chronic kidney disease with stage 1 through stage 4 chronic kidney disease, or unspecified chronic kidney disease: Secondary | ICD-10-CM | POA: Diagnosis not present

## 2023-04-10 DIAGNOSIS — Z471 Aftercare following joint replacement surgery: Secondary | ICD-10-CM | POA: Diagnosis not present

## 2023-04-14 DIAGNOSIS — I129 Hypertensive chronic kidney disease with stage 1 through stage 4 chronic kidney disease, or unspecified chronic kidney disease: Secondary | ICD-10-CM | POA: Diagnosis not present

## 2023-04-14 DIAGNOSIS — N189 Chronic kidney disease, unspecified: Secondary | ICD-10-CM | POA: Diagnosis not present

## 2023-04-14 DIAGNOSIS — Z471 Aftercare following joint replacement surgery: Secondary | ICD-10-CM | POA: Diagnosis not present

## 2023-04-14 DIAGNOSIS — E039 Hypothyroidism, unspecified: Secondary | ICD-10-CM | POA: Diagnosis not present

## 2023-04-14 DIAGNOSIS — E1122 Type 2 diabetes mellitus with diabetic chronic kidney disease: Secondary | ICD-10-CM | POA: Diagnosis not present

## 2023-04-14 DIAGNOSIS — Z556 Problems related to health literacy: Secondary | ICD-10-CM | POA: Diagnosis not present

## 2023-04-14 DIAGNOSIS — E782 Mixed hyperlipidemia: Secondary | ICD-10-CM | POA: Diagnosis not present

## 2023-04-15 DIAGNOSIS — M1711 Unilateral primary osteoarthritis, right knee: Secondary | ICD-10-CM | POA: Diagnosis not present

## 2023-04-16 DIAGNOSIS — Z556 Problems related to health literacy: Secondary | ICD-10-CM | POA: Diagnosis not present

## 2023-04-16 DIAGNOSIS — I129 Hypertensive chronic kidney disease with stage 1 through stage 4 chronic kidney disease, or unspecified chronic kidney disease: Secondary | ICD-10-CM | POA: Diagnosis not present

## 2023-04-16 DIAGNOSIS — E039 Hypothyroidism, unspecified: Secondary | ICD-10-CM | POA: Diagnosis not present

## 2023-04-16 DIAGNOSIS — M1711 Unilateral primary osteoarthritis, right knee: Secondary | ICD-10-CM | POA: Diagnosis not present

## 2023-04-16 DIAGNOSIS — N189 Chronic kidney disease, unspecified: Secondary | ICD-10-CM | POA: Diagnosis not present

## 2023-04-16 DIAGNOSIS — E1122 Type 2 diabetes mellitus with diabetic chronic kidney disease: Secondary | ICD-10-CM | POA: Diagnosis not present

## 2023-04-16 DIAGNOSIS — Z471 Aftercare following joint replacement surgery: Secondary | ICD-10-CM | POA: Diagnosis not present

## 2023-04-18 DIAGNOSIS — Z556 Problems related to health literacy: Secondary | ICD-10-CM | POA: Diagnosis not present

## 2023-04-18 DIAGNOSIS — E1122 Type 2 diabetes mellitus with diabetic chronic kidney disease: Secondary | ICD-10-CM | POA: Diagnosis not present

## 2023-04-18 DIAGNOSIS — E039 Hypothyroidism, unspecified: Secondary | ICD-10-CM | POA: Diagnosis not present

## 2023-04-18 DIAGNOSIS — Z471 Aftercare following joint replacement surgery: Secondary | ICD-10-CM | POA: Diagnosis not present

## 2023-04-18 DIAGNOSIS — N189 Chronic kidney disease, unspecified: Secondary | ICD-10-CM | POA: Diagnosis not present

## 2023-04-18 DIAGNOSIS — I129 Hypertensive chronic kidney disease with stage 1 through stage 4 chronic kidney disease, or unspecified chronic kidney disease: Secondary | ICD-10-CM | POA: Diagnosis not present

## 2023-04-22 DIAGNOSIS — E1122 Type 2 diabetes mellitus with diabetic chronic kidney disease: Secondary | ICD-10-CM | POA: Diagnosis not present

## 2023-04-22 DIAGNOSIS — N189 Chronic kidney disease, unspecified: Secondary | ICD-10-CM | POA: Diagnosis not present

## 2023-04-22 DIAGNOSIS — Z471 Aftercare following joint replacement surgery: Secondary | ICD-10-CM | POA: Diagnosis not present

## 2023-04-22 DIAGNOSIS — E039 Hypothyroidism, unspecified: Secondary | ICD-10-CM | POA: Diagnosis not present

## 2023-04-22 DIAGNOSIS — I129 Hypertensive chronic kidney disease with stage 1 through stage 4 chronic kidney disease, or unspecified chronic kidney disease: Secondary | ICD-10-CM | POA: Diagnosis not present

## 2023-04-22 DIAGNOSIS — Z556 Problems related to health literacy: Secondary | ICD-10-CM | POA: Diagnosis not present

## 2023-04-23 DIAGNOSIS — S90521A Blister (nonthermal), right ankle, initial encounter: Secondary | ICD-10-CM | POA: Diagnosis not present

## 2023-04-23 DIAGNOSIS — R030 Elevated blood-pressure reading, without diagnosis of hypertension: Secondary | ICD-10-CM | POA: Diagnosis not present

## 2023-04-23 DIAGNOSIS — R600 Localized edema: Secondary | ICD-10-CM | POA: Diagnosis not present

## 2023-04-23 DIAGNOSIS — N184 Chronic kidney disease, stage 4 (severe): Secondary | ICD-10-CM | POA: Diagnosis not present

## 2023-04-23 DIAGNOSIS — E1122 Type 2 diabetes mellitus with diabetic chronic kidney disease: Secondary | ICD-10-CM | POA: Diagnosis not present

## 2023-04-23 DIAGNOSIS — M1711 Unilateral primary osteoarthritis, right knee: Secondary | ICD-10-CM | POA: Diagnosis not present

## 2023-04-24 DIAGNOSIS — N189 Chronic kidney disease, unspecified: Secondary | ICD-10-CM | POA: Diagnosis not present

## 2023-04-24 DIAGNOSIS — I129 Hypertensive chronic kidney disease with stage 1 through stage 4 chronic kidney disease, or unspecified chronic kidney disease: Secondary | ICD-10-CM | POA: Diagnosis not present

## 2023-04-24 DIAGNOSIS — E039 Hypothyroidism, unspecified: Secondary | ICD-10-CM | POA: Diagnosis not present

## 2023-04-24 DIAGNOSIS — E1122 Type 2 diabetes mellitus with diabetic chronic kidney disease: Secondary | ICD-10-CM | POA: Diagnosis not present

## 2023-04-24 DIAGNOSIS — Z556 Problems related to health literacy: Secondary | ICD-10-CM | POA: Diagnosis not present

## 2023-04-24 DIAGNOSIS — Z471 Aftercare following joint replacement surgery: Secondary | ICD-10-CM | POA: Diagnosis not present

## 2023-04-28 DIAGNOSIS — N189 Chronic kidney disease, unspecified: Secondary | ICD-10-CM | POA: Diagnosis not present

## 2023-04-28 DIAGNOSIS — N1832 Chronic kidney disease, stage 3b: Secondary | ICD-10-CM | POA: Diagnosis not present

## 2023-04-28 DIAGNOSIS — E876 Hypokalemia: Secondary | ICD-10-CM | POA: Diagnosis not present

## 2023-04-28 DIAGNOSIS — E1122 Type 2 diabetes mellitus with diabetic chronic kidney disease: Secondary | ICD-10-CM | POA: Diagnosis not present

## 2023-04-29 DIAGNOSIS — E1122 Type 2 diabetes mellitus with diabetic chronic kidney disease: Secondary | ICD-10-CM | POA: Diagnosis not present

## 2023-04-29 DIAGNOSIS — I129 Hypertensive chronic kidney disease with stage 1 through stage 4 chronic kidney disease, or unspecified chronic kidney disease: Secondary | ICD-10-CM | POA: Diagnosis not present

## 2023-04-29 DIAGNOSIS — Z556 Problems related to health literacy: Secondary | ICD-10-CM | POA: Diagnosis not present

## 2023-04-29 DIAGNOSIS — E039 Hypothyroidism, unspecified: Secondary | ICD-10-CM | POA: Diagnosis not present

## 2023-04-29 DIAGNOSIS — N189 Chronic kidney disease, unspecified: Secondary | ICD-10-CM | POA: Diagnosis not present

## 2023-04-29 DIAGNOSIS — Z471 Aftercare following joint replacement surgery: Secondary | ICD-10-CM | POA: Diagnosis not present

## 2023-05-01 DIAGNOSIS — E039 Hypothyroidism, unspecified: Secondary | ICD-10-CM | POA: Diagnosis not present

## 2023-05-01 DIAGNOSIS — I129 Hypertensive chronic kidney disease with stage 1 through stage 4 chronic kidney disease, or unspecified chronic kidney disease: Secondary | ICD-10-CM | POA: Diagnosis not present

## 2023-05-01 DIAGNOSIS — Z556 Problems related to health literacy: Secondary | ICD-10-CM | POA: Diagnosis not present

## 2023-05-01 DIAGNOSIS — N189 Chronic kidney disease, unspecified: Secondary | ICD-10-CM | POA: Diagnosis not present

## 2023-05-01 DIAGNOSIS — E1122 Type 2 diabetes mellitus with diabetic chronic kidney disease: Secondary | ICD-10-CM | POA: Diagnosis not present

## 2023-05-01 DIAGNOSIS — Z471 Aftercare following joint replacement surgery: Secondary | ICD-10-CM | POA: Diagnosis not present

## 2023-05-06 DIAGNOSIS — Z96651 Presence of right artificial knee joint: Secondary | ICD-10-CM | POA: Diagnosis not present

## 2023-05-06 DIAGNOSIS — R262 Difficulty in walking, not elsewhere classified: Secondary | ICD-10-CM | POA: Diagnosis not present

## 2023-05-08 DIAGNOSIS — E1122 Type 2 diabetes mellitus with diabetic chronic kidney disease: Secondary | ICD-10-CM | POA: Diagnosis not present

## 2023-05-08 DIAGNOSIS — E876 Hypokalemia: Secondary | ICD-10-CM | POA: Diagnosis not present

## 2023-05-08 DIAGNOSIS — I129 Hypertensive chronic kidney disease with stage 1 through stage 4 chronic kidney disease, or unspecified chronic kidney disease: Secondary | ICD-10-CM | POA: Diagnosis not present

## 2023-05-08 DIAGNOSIS — N184 Chronic kidney disease, stage 4 (severe): Secondary | ICD-10-CM | POA: Diagnosis not present

## 2023-05-08 DIAGNOSIS — N189 Chronic kidney disease, unspecified: Secondary | ICD-10-CM | POA: Diagnosis not present

## 2023-05-08 DIAGNOSIS — R600 Localized edema: Secondary | ICD-10-CM | POA: Diagnosis not present

## 2023-05-09 DIAGNOSIS — R262 Difficulty in walking, not elsewhere classified: Secondary | ICD-10-CM | POA: Diagnosis not present

## 2023-05-09 DIAGNOSIS — Z96651 Presence of right artificial knee joint: Secondary | ICD-10-CM | POA: Diagnosis not present

## 2023-05-10 DIAGNOSIS — M545 Low back pain, unspecified: Secondary | ICD-10-CM | POA: Diagnosis not present

## 2023-05-10 DIAGNOSIS — E1122 Type 2 diabetes mellitus with diabetic chronic kidney disease: Secondary | ICD-10-CM | POA: Diagnosis not present

## 2023-05-10 DIAGNOSIS — M4316 Spondylolisthesis, lumbar region: Secondary | ICD-10-CM | POA: Diagnosis not present

## 2023-05-10 DIAGNOSIS — E039 Hypothyroidism, unspecified: Secondary | ICD-10-CM | POA: Diagnosis not present

## 2023-05-10 DIAGNOSIS — Z87891 Personal history of nicotine dependence: Secondary | ICD-10-CM | POA: Diagnosis not present

## 2023-05-10 DIAGNOSIS — M48061 Spinal stenosis, lumbar region without neurogenic claudication: Secondary | ICD-10-CM | POA: Diagnosis not present

## 2023-05-10 DIAGNOSIS — I129 Hypertensive chronic kidney disease with stage 1 through stage 4 chronic kidney disease, or unspecified chronic kidney disease: Secondary | ICD-10-CM | POA: Diagnosis not present

## 2023-05-10 DIAGNOSIS — N189 Chronic kidney disease, unspecified: Secondary | ICD-10-CM | POA: Diagnosis not present

## 2023-05-10 DIAGNOSIS — Z9889 Other specified postprocedural states: Secondary | ICD-10-CM | POA: Diagnosis not present

## 2023-05-10 DIAGNOSIS — M549 Dorsalgia, unspecified: Secondary | ICD-10-CM | POA: Diagnosis not present

## 2023-05-10 DIAGNOSIS — M51369 Other intervertebral disc degeneration, lumbar region without mention of lumbar back pain or lower extremity pain: Secondary | ICD-10-CM | POA: Diagnosis not present

## 2023-05-10 DIAGNOSIS — M109 Gout, unspecified: Secondary | ICD-10-CM | POA: Diagnosis not present

## 2023-05-10 DIAGNOSIS — E785 Hyperlipidemia, unspecified: Secondary | ICD-10-CM | POA: Diagnosis not present

## 2023-05-13 DIAGNOSIS — M1711 Unilateral primary osteoarthritis, right knee: Secondary | ICD-10-CM | POA: Diagnosis not present

## 2023-05-21 DIAGNOSIS — Z96651 Presence of right artificial knee joint: Secondary | ICD-10-CM | POA: Diagnosis not present

## 2023-05-21 DIAGNOSIS — R262 Difficulty in walking, not elsewhere classified: Secondary | ICD-10-CM | POA: Diagnosis not present

## 2023-05-23 DIAGNOSIS — R262 Difficulty in walking, not elsewhere classified: Secondary | ICD-10-CM | POA: Diagnosis not present

## 2023-05-23 DIAGNOSIS — Z96651 Presence of right artificial knee joint: Secondary | ICD-10-CM | POA: Diagnosis not present

## 2023-05-26 DIAGNOSIS — R600 Localized edema: Secondary | ICD-10-CM | POA: Diagnosis not present

## 2023-05-26 DIAGNOSIS — E1122 Type 2 diabetes mellitus with diabetic chronic kidney disease: Secondary | ICD-10-CM | POA: Diagnosis not present

## 2023-05-26 DIAGNOSIS — I129 Hypertensive chronic kidney disease with stage 1 through stage 4 chronic kidney disease, or unspecified chronic kidney disease: Secondary | ICD-10-CM | POA: Diagnosis not present

## 2023-05-26 DIAGNOSIS — N184 Chronic kidney disease, stage 4 (severe): Secondary | ICD-10-CM | POA: Diagnosis not present

## 2023-05-26 DIAGNOSIS — E876 Hypokalemia: Secondary | ICD-10-CM | POA: Diagnosis not present

## 2023-05-26 DIAGNOSIS — N189 Chronic kidney disease, unspecified: Secondary | ICD-10-CM | POA: Diagnosis not present

## 2023-05-29 DIAGNOSIS — N189 Chronic kidney disease, unspecified: Secondary | ICD-10-CM | POA: Diagnosis not present

## 2023-05-29 DIAGNOSIS — E1122 Type 2 diabetes mellitus with diabetic chronic kidney disease: Secondary | ICD-10-CM | POA: Diagnosis not present

## 2023-05-29 DIAGNOSIS — I129 Hypertensive chronic kidney disease with stage 1 through stage 4 chronic kidney disease, or unspecified chronic kidney disease: Secondary | ICD-10-CM | POA: Diagnosis not present

## 2023-05-29 DIAGNOSIS — N184 Chronic kidney disease, stage 4 (severe): Secondary | ICD-10-CM | POA: Diagnosis not present

## 2023-05-29 DIAGNOSIS — R600 Localized edema: Secondary | ICD-10-CM | POA: Diagnosis not present

## 2023-06-03 DIAGNOSIS — R262 Difficulty in walking, not elsewhere classified: Secondary | ICD-10-CM | POA: Diagnosis not present

## 2023-06-03 DIAGNOSIS — Z96651 Presence of right artificial knee joint: Secondary | ICD-10-CM | POA: Diagnosis not present

## 2023-06-17 DIAGNOSIS — R262 Difficulty in walking, not elsewhere classified: Secondary | ICD-10-CM | POA: Diagnosis not present

## 2023-06-17 DIAGNOSIS — Z96651 Presence of right artificial knee joint: Secondary | ICD-10-CM | POA: Diagnosis not present

## 2023-06-23 DIAGNOSIS — N189 Chronic kidney disease, unspecified: Secondary | ICD-10-CM | POA: Diagnosis not present

## 2023-06-23 DIAGNOSIS — E7849 Other hyperlipidemia: Secondary | ICD-10-CM | POA: Diagnosis not present

## 2023-06-23 DIAGNOSIS — E1122 Type 2 diabetes mellitus with diabetic chronic kidney disease: Secondary | ICD-10-CM | POA: Diagnosis not present

## 2023-06-23 DIAGNOSIS — E876 Hypokalemia: Secondary | ICD-10-CM | POA: Diagnosis not present

## 2023-06-30 DIAGNOSIS — L259 Unspecified contact dermatitis, unspecified cause: Secondary | ICD-10-CM | POA: Diagnosis not present

## 2023-06-30 DIAGNOSIS — Z6837 Body mass index (BMI) 37.0-37.9, adult: Secondary | ICD-10-CM | POA: Diagnosis not present

## 2023-06-30 DIAGNOSIS — E039 Hypothyroidism, unspecified: Secondary | ICD-10-CM | POA: Diagnosis not present

## 2023-06-30 DIAGNOSIS — E782 Mixed hyperlipidemia: Secondary | ICD-10-CM | POA: Diagnosis not present

## 2023-06-30 DIAGNOSIS — E7849 Other hyperlipidemia: Secondary | ICD-10-CM | POA: Diagnosis not present

## 2023-06-30 DIAGNOSIS — N184 Chronic kidney disease, stage 4 (severe): Secondary | ICD-10-CM | POA: Diagnosis not present

## 2023-06-30 DIAGNOSIS — I10 Essential (primary) hypertension: Secondary | ICD-10-CM | POA: Diagnosis not present

## 2023-06-30 DIAGNOSIS — E1122 Type 2 diabetes mellitus with diabetic chronic kidney disease: Secondary | ICD-10-CM | POA: Diagnosis not present

## 2023-07-15 DIAGNOSIS — J301 Allergic rhinitis due to pollen: Secondary | ICD-10-CM | POA: Diagnosis not present

## 2023-07-15 DIAGNOSIS — E782 Mixed hyperlipidemia: Secondary | ICD-10-CM | POA: Diagnosis not present

## 2023-07-15 DIAGNOSIS — E1122 Type 2 diabetes mellitus with diabetic chronic kidney disease: Secondary | ICD-10-CM | POA: Diagnosis not present

## 2023-08-19 DIAGNOSIS — E1122 Type 2 diabetes mellitus with diabetic chronic kidney disease: Secondary | ICD-10-CM | POA: Diagnosis not present

## 2023-08-19 DIAGNOSIS — I129 Hypertensive chronic kidney disease with stage 1 through stage 4 chronic kidney disease, or unspecified chronic kidney disease: Secondary | ICD-10-CM | POA: Diagnosis not present

## 2023-08-19 DIAGNOSIS — N189 Chronic kidney disease, unspecified: Secondary | ICD-10-CM | POA: Diagnosis not present

## 2023-08-19 DIAGNOSIS — N184 Chronic kidney disease, stage 4 (severe): Secondary | ICD-10-CM | POA: Diagnosis not present

## 2023-08-19 DIAGNOSIS — R600 Localized edema: Secondary | ICD-10-CM | POA: Diagnosis not present

## 2023-08-28 DIAGNOSIS — N184 Chronic kidney disease, stage 4 (severe): Secondary | ICD-10-CM | POA: Diagnosis not present

## 2023-08-28 DIAGNOSIS — N189 Chronic kidney disease, unspecified: Secondary | ICD-10-CM | POA: Diagnosis not present

## 2023-08-28 DIAGNOSIS — E1122 Type 2 diabetes mellitus with diabetic chronic kidney disease: Secondary | ICD-10-CM | POA: Diagnosis not present

## 2023-08-28 DIAGNOSIS — R600 Localized edema: Secondary | ICD-10-CM | POA: Diagnosis not present

## 2023-09-10 DIAGNOSIS — L2089 Other atopic dermatitis: Secondary | ICD-10-CM | POA: Diagnosis not present

## 2023-09-12 DIAGNOSIS — R600 Localized edema: Secondary | ICD-10-CM | POA: Diagnosis not present

## 2023-09-12 DIAGNOSIS — I10 Essential (primary) hypertension: Secondary | ICD-10-CM | POA: Diagnosis not present

## 2023-09-29 DIAGNOSIS — E876 Hypokalemia: Secondary | ICD-10-CM | POA: Diagnosis not present

## 2023-09-29 DIAGNOSIS — N184 Chronic kidney disease, stage 4 (severe): Secondary | ICD-10-CM | POA: Diagnosis not present

## 2023-09-29 DIAGNOSIS — E1165 Type 2 diabetes mellitus with hyperglycemia: Secondary | ICD-10-CM | POA: Diagnosis not present

## 2023-09-29 DIAGNOSIS — I10 Essential (primary) hypertension: Secondary | ICD-10-CM | POA: Diagnosis not present

## 2023-09-29 DIAGNOSIS — E039 Hypothyroidism, unspecified: Secondary | ICD-10-CM | POA: Diagnosis not present

## 2023-10-02 DIAGNOSIS — N189 Chronic kidney disease, unspecified: Secondary | ICD-10-CM | POA: Diagnosis not present

## 2023-10-02 DIAGNOSIS — E1122 Type 2 diabetes mellitus with diabetic chronic kidney disease: Secondary | ICD-10-CM | POA: Diagnosis not present

## 2023-10-02 DIAGNOSIS — N184 Chronic kidney disease, stage 4 (severe): Secondary | ICD-10-CM | POA: Diagnosis not present

## 2023-10-02 DIAGNOSIS — I129 Hypertensive chronic kidney disease with stage 1 through stage 4 chronic kidney disease, or unspecified chronic kidney disease: Secondary | ICD-10-CM | POA: Diagnosis not present

## 2023-10-02 DIAGNOSIS — R600 Localized edema: Secondary | ICD-10-CM | POA: Diagnosis not present

## 2023-10-08 DIAGNOSIS — Z131 Encounter for screening for diabetes mellitus: Secondary | ICD-10-CM | POA: Diagnosis not present

## 2023-10-13 DIAGNOSIS — R600 Localized edema: Secondary | ICD-10-CM | POA: Diagnosis not present

## 2023-10-13 DIAGNOSIS — H20043 Secondary noninfectious iridocyclitis, bilateral: Secondary | ICD-10-CM | POA: Diagnosis not present

## 2023-10-13 DIAGNOSIS — I10 Essential (primary) hypertension: Secondary | ICD-10-CM | POA: Diagnosis not present

## 2023-10-20 DIAGNOSIS — H20043 Secondary noninfectious iridocyclitis, bilateral: Secondary | ICD-10-CM | POA: Diagnosis not present

## 2023-10-29 DIAGNOSIS — H20043 Secondary noninfectious iridocyclitis, bilateral: Secondary | ICD-10-CM | POA: Diagnosis not present

## 2023-11-10 DIAGNOSIS — L2089 Other atopic dermatitis: Secondary | ICD-10-CM | POA: Diagnosis not present

## 2023-11-12 DIAGNOSIS — R600 Localized edema: Secondary | ICD-10-CM | POA: Diagnosis not present

## 2023-11-12 DIAGNOSIS — I10 Essential (primary) hypertension: Secondary | ICD-10-CM | POA: Diagnosis not present

## 2023-11-12 DIAGNOSIS — H524 Presbyopia: Secondary | ICD-10-CM | POA: Diagnosis not present

## 2023-11-12 DIAGNOSIS — H20043 Secondary noninfectious iridocyclitis, bilateral: Secondary | ICD-10-CM | POA: Diagnosis not present

## 2023-12-12 DIAGNOSIS — I10 Essential (primary) hypertension: Secondary | ICD-10-CM | POA: Diagnosis not present

## 2023-12-12 DIAGNOSIS — R600 Localized edema: Secondary | ICD-10-CM | POA: Diagnosis not present

## 2023-12-23 DIAGNOSIS — E1122 Type 2 diabetes mellitus with diabetic chronic kidney disease: Secondary | ICD-10-CM | POA: Diagnosis not present

## 2023-12-23 DIAGNOSIS — I10 Essential (primary) hypertension: Secondary | ICD-10-CM | POA: Diagnosis not present

## 2023-12-23 DIAGNOSIS — N184 Chronic kidney disease, stage 4 (severe): Secondary | ICD-10-CM | POA: Diagnosis not present

## 2023-12-23 DIAGNOSIS — E1165 Type 2 diabetes mellitus with hyperglycemia: Secondary | ICD-10-CM | POA: Diagnosis not present

## 2023-12-23 DIAGNOSIS — E876 Hypokalemia: Secondary | ICD-10-CM | POA: Diagnosis not present

## 2023-12-23 DIAGNOSIS — E7849 Other hyperlipidemia: Secondary | ICD-10-CM | POA: Diagnosis not present

## 2023-12-30 DIAGNOSIS — E1122 Type 2 diabetes mellitus with diabetic chronic kidney disease: Secondary | ICD-10-CM | POA: Diagnosis not present

## 2023-12-30 DIAGNOSIS — M1712 Unilateral primary osteoarthritis, left knee: Secondary | ICD-10-CM | POA: Diagnosis not present

## 2023-12-30 DIAGNOSIS — I10 Essential (primary) hypertension: Secondary | ICD-10-CM | POA: Diagnosis not present

## 2023-12-30 DIAGNOSIS — N184 Chronic kidney disease, stage 4 (severe): Secondary | ICD-10-CM | POA: Diagnosis not present

## 2023-12-30 DIAGNOSIS — Z6837 Body mass index (BMI) 37.0-37.9, adult: Secondary | ICD-10-CM | POA: Diagnosis not present

## 2024-01-12 DIAGNOSIS — I10 Essential (primary) hypertension: Secondary | ICD-10-CM | POA: Diagnosis not present

## 2024-01-12 DIAGNOSIS — R600 Localized edema: Secondary | ICD-10-CM | POA: Diagnosis not present

## 2024-01-30 ENCOUNTER — Other Ambulatory Visit (INDEPENDENT_AMBULATORY_CARE_PROVIDER_SITE_OTHER): Payer: Self-pay

## 2024-01-30 ENCOUNTER — Telehealth: Payer: Self-pay | Admitting: Orthopedic Surgery

## 2024-01-30 ENCOUNTER — Ambulatory Visit: Admitting: Orthopedic Surgery

## 2024-01-30 ENCOUNTER — Encounter: Payer: Self-pay | Admitting: Orthopedic Surgery

## 2024-01-30 VITALS — BP 158/93 | HR 76 | Ht 65.0 in | Wt 230.0 lb

## 2024-01-30 DIAGNOSIS — G8929 Other chronic pain: Secondary | ICD-10-CM

## 2024-01-30 DIAGNOSIS — M1712 Unilateral primary osteoarthritis, left knee: Secondary | ICD-10-CM

## 2024-01-30 MED ORDER — METHYLPREDNISOLONE ACETATE 40 MG/ML IJ SUSP
40.0000 mg | Freq: Once | INTRAMUSCULAR | Status: AC
Start: 2024-01-30 — End: 2024-01-30
  Administered 2024-01-30: 40 mg via INTRA_ARTICULAR

## 2024-01-30 NOTE — Progress Notes (Signed)
 Patient ID: Joan Torres, female   DOB: 06-27-1946, 78 y.o.   MRN: [id number]  Chief Complaint  Patient presents with   Knee Pain    left    Current Outpatient Medications  Medication Instructions   antiseptic oral rinse (BIOTENE) LIQD 15 mLs, Mouth Rinse, As needed   atorvastatin  (LIPITOR) 40 mg, Oral, Daily at bedtime   Camphor-Eucalyptus-Menthol  (VICKS VAPORUB EX) 1 application , Nasal, 3 times daily PRN   Cartia  XT 180 mg, Oral, Daily   Colchicine 0.6-1.2 mg, Oral, See admin instructions, Take 1.2 mg at the first sign of gout then continue to take 0.6 mg as needed for gout flare   dapagliflozin  propanediol (FARXIGA ) 10 mg, Oral, Daily   ezetimibe  (ZETIA ) 10 mg, Oral, Daily at bedtime   furosemide  (LASIX ) 20 mg, Oral, Daily PRN   gabapentin  (NEURONTIN ) 600 mg, Oral, Daily PRN   glipiZIDE  (GLUCOTROL ) 10 mg, Oral, 2 times daily   ketorolac  (ACULAR ) 0.5 % ophthalmic solution 1 drop, Left Eye, 3 times daily, Until gone   levothyroxine  (SYNTHROID ) 112 mcg, Oral, Daily at bedtime   moxifloxacin (VIGAMOX) 0.5 % ophthalmic solution 1 drop, Left Eye, 3 times daily, Until gone   omeprazole  (PRILOSEC) 40 mg, Oral, Daily   Ozempic (0.25 or 0.5 MG/DOSE) 0.5 mg, Subcutaneous, Every Wed   polyethylene glycol (MIRALAX ) 17 g, Oral, Daily   potassium chloride  SA (KLOR-CON  M) 20 MEQ tablet 20 mEq, Oral, 2 times daily   prednisoLONE  acetate (PRED FORTE ) 1 % ophthalmic suspension 1 drop, Left Eye, 3 times daily, Until gone    No Known Allergies   78 year old female status post right total knee arthroplasty last year at [facility] did well other than she had to stay in the hospital for 5 days secondary to failure to progress with stair.  She stayed in [place]  with her fianc to get therapy from [place] .  She comes in with chronic left knee pain which was hurting her before the right knee was done.  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  She is interested in left total knee arthroplasty.  Knee Pain      ROS other than the kidney disease nothing symptomatic at this time  Past Medical History:  Diagnosis Date   Arthritis    Chronic kidney disease    Diabetes mellitus without complication (HCC)    Hypertension    Hypothyroidism     Past Surgical History:  Procedure Laterality Date   ABDOMINAL HYSTERECTOMY     APPENDECTOMY     BACK SURGERY  2019   BREAST SURGERY     CATARACT EXTRACTION, BILATERAL     CYST REMOVAL NECK N/A 2018   HAND SURGERY Right    TOTAL KNEE ARTHROPLASTY Right 03/31/2023   Procedure: TOTAL KNEE ARTHROPLASTY;  Surgeon: Edna Toribio LABOR, MD;  Location: [facility];  Service: Orthopedics;  Laterality: Right;    PHYSICAL EXAM  BP (!) 158/93   Pulse 76   Ht 5' 5 (1.651 m)   Wt 230 lb (104.3 kg)   BMI 38.27 kg/m  GENERAL appearance reveals no gross abnormalities, normal development grooming and hygiene   MENTAL STATUS we note that the patient is awake alert and oriented to person place and time MOOD/AFFECT ARE NORMAL   GAIT reveals significant limp in the effected limb, walker used for ambulation  Ortho Exam  Right knee 0-95 range of motion  Left knee 0-95 range of motion Stability tests were normal Knee is definitely in valgus with no collateral  ligament instability   VASC 2+ dorsalis pedis pulse normal capillary refill excellent warmth to the extremity  Skin shows darkening from chronic venous stasis disease   NEURO normal sensation and no pathologic reflexes  LYMPH deferred noncontributory  MEDICAL DECISION MAKING  A.  Encounter Diagnosis  Name Primary?   Chronic pain of left knee Yes    B. DATA ANALYSED:   IMAGING: Independent interpretation of images: DG Knee AP/LAT W/Sunrise Left Result Date: 01/30/2024 Imaging study left knee chronic left knee pain Left knee is in significant valgus she has bone-on-bone change in the lateral compartment there is a joint effusion in the patellofemoral joint shows good axial alignment with  peripheral osteophytes Posterior tibial and femoral send Impression severe OA 17 degree valgus deformity left knee     Orders:   Outside records reviewed: Yes prior orthopedic note for right total knee  C. MANAGEMENT cortisone injection  She will talk with [facility] regarding rehab stay after surgery as she lives alone and cannot stay by herself after surgery Procedure note left knee injection   verbal consent was obtained to inject left knee joint  Timeout was completed to confirm the site of injection  The medications used were depomedrol 40 mg and 1% lidocaine  3 cc Anesthesia was provided by ethyl chloride and the skin was prepped with alcohol .  After cleaning the skin with alcohol  a 20-gauge needle was used to inject the left knee joint. There were no complications. A sterile bandage was applied.     Meds ordered this encounter  Medications   methylPREDNISolone  acetate (DEPO-MEDROL ) injection 40 mg

## 2024-01-30 NOTE — Progress Notes (Signed)
  Intake history:  BP (!) 158/93   Pulse 76   Ht 5' 5 (1.651 m)   Wt 230 lb (104.3 kg)   BMI 38.27 kg/m  Body mass index is 38.27 kg/m.    WHAT ARE WE SEEING YOU FOR TODAY?   left knee(s)  How long has this bothered you? (DOI?DOS?WS?)  1 year(s) ago  Anticoag.  No  Diabetes Yes  Heart disease No  Hypertension Yes  SMOKING HX No  Kidney disease Yes  Any ALLERGIES _______No Known Allergies _______________________________________   Treatment:  Have you taken:  Tylenol  No  Advil No  Had PT No  Had injection No  Other  _________________________

## 2024-01-30 NOTE — Telephone Encounter (Signed)
 Dr. Areatha pt - pt lvm stating she needs to speak to Dr. Margrette or his assistant, that she was seen this morning and Dr. VEAR told her to call her ins co.  She wants to speak to someone about that.  [phone]

## 2024-02-12 DIAGNOSIS — R600 Localized edema: Secondary | ICD-10-CM | POA: Diagnosis not present

## 2024-02-12 DIAGNOSIS — I10 Essential (primary) hypertension: Secondary | ICD-10-CM | POA: Diagnosis not present

## 2024-02-16 DIAGNOSIS — G8929 Other chronic pain: Secondary | ICD-10-CM | POA: Diagnosis not present

## 2024-02-16 DIAGNOSIS — E669 Obesity, unspecified: Secondary | ICD-10-CM | POA: Diagnosis not present

## 2024-02-16 DIAGNOSIS — M1712 Unilateral primary osteoarthritis, left knee: Secondary | ICD-10-CM | POA: Diagnosis not present

## 2024-02-16 DIAGNOSIS — Z6838 Body mass index (BMI) 38.0-38.9, adult: Secondary | ICD-10-CM | POA: Diagnosis not present

## 2024-02-16 DIAGNOSIS — M25562 Pain in left knee: Secondary | ICD-10-CM | POA: Diagnosis not present

## 2024-02-17 DIAGNOSIS — R600 Localized edema: Secondary | ICD-10-CM | POA: Diagnosis not present

## 2024-02-17 DIAGNOSIS — N189 Chronic kidney disease, unspecified: Secondary | ICD-10-CM | POA: Diagnosis not present

## 2024-02-17 DIAGNOSIS — E1122 Type 2 diabetes mellitus with diabetic chronic kidney disease: Secondary | ICD-10-CM | POA: Diagnosis not present

## 2024-02-17 DIAGNOSIS — I129 Hypertensive chronic kidney disease with stage 1 through stage 4 chronic kidney disease, or unspecified chronic kidney disease: Secondary | ICD-10-CM | POA: Diagnosis not present

## 2024-02-17 DIAGNOSIS — N184 Chronic kidney disease, stage 4 (severe): Secondary | ICD-10-CM | POA: Diagnosis not present

## 2024-02-25 DIAGNOSIS — M1712 Unilateral primary osteoarthritis, left knee: Secondary | ICD-10-CM | POA: Diagnosis not present

## 2024-02-25 DIAGNOSIS — Z6837 Body mass index (BMI) 37.0-37.9, adult: Secondary | ICD-10-CM | POA: Diagnosis not present

## 2024-02-25 DIAGNOSIS — Z01818 Encounter for other preprocedural examination: Secondary | ICD-10-CM | POA: Diagnosis not present

## 2024-02-25 DIAGNOSIS — E1122 Type 2 diabetes mellitus with diabetic chronic kidney disease: Secondary | ICD-10-CM | POA: Diagnosis not present

## 2024-02-26 DIAGNOSIS — E1122 Type 2 diabetes mellitus with diabetic chronic kidney disease: Secondary | ICD-10-CM | POA: Diagnosis not present

## 2024-02-26 DIAGNOSIS — N1832 Chronic kidney disease, stage 3b: Secondary | ICD-10-CM | POA: Diagnosis not present

## 2024-02-26 DIAGNOSIS — R600 Localized edema: Secondary | ICD-10-CM | POA: Diagnosis not present

## 2024-02-26 DIAGNOSIS — I129 Hypertensive chronic kidney disease with stage 1 through stage 4 chronic kidney disease, or unspecified chronic kidney disease: Secondary | ICD-10-CM | POA: Diagnosis not present

## 2024-02-26 DIAGNOSIS — E876 Hypokalemia: Secondary | ICD-10-CM | POA: Diagnosis not present

## 2024-02-26 DIAGNOSIS — N189 Chronic kidney disease, unspecified: Secondary | ICD-10-CM | POA: Diagnosis not present

## 2024-03-11 DIAGNOSIS — E1122 Type 2 diabetes mellitus with diabetic chronic kidney disease: Secondary | ICD-10-CM | POA: Diagnosis not present

## 2024-03-11 DIAGNOSIS — E785 Hyperlipidemia, unspecified: Secondary | ICD-10-CM | POA: Diagnosis not present

## 2024-03-11 DIAGNOSIS — I129 Hypertensive chronic kidney disease with stage 1 through stage 4 chronic kidney disease, or unspecified chronic kidney disease: Secondary | ICD-10-CM | POA: Diagnosis not present

## 2024-03-11 DIAGNOSIS — R110 Nausea: Secondary | ICD-10-CM | POA: Diagnosis not present

## 2024-03-11 DIAGNOSIS — K529 Noninfective gastroenteritis and colitis, unspecified: Secondary | ICD-10-CM | POA: Diagnosis not present

## 2024-03-11 DIAGNOSIS — Z87891 Personal history of nicotine dependence: Secondary | ICD-10-CM | POA: Diagnosis not present

## 2024-03-11 DIAGNOSIS — Z20822 Contact with and (suspected) exposure to covid-19: Secondary | ICD-10-CM | POA: Diagnosis not present

## 2024-03-11 DIAGNOSIS — Z1152 Encounter for screening for COVID-19: Secondary | ICD-10-CM | POA: Diagnosis not present

## 2024-03-11 DIAGNOSIS — N189 Chronic kidney disease, unspecified: Secondary | ICD-10-CM | POA: Diagnosis not present

## 2024-03-12 DIAGNOSIS — I10 Essential (primary) hypertension: Secondary | ICD-10-CM | POA: Diagnosis not present

## 2024-03-12 DIAGNOSIS — R600 Localized edema: Secondary | ICD-10-CM | POA: Diagnosis not present

## 2024-03-25 DIAGNOSIS — H31091 Other chorioretinal scars, right eye: Secondary | ICD-10-CM | POA: Diagnosis not present

## 2024-03-25 DIAGNOSIS — H26491 Other secondary cataract, right eye: Secondary | ICD-10-CM | POA: Diagnosis not present

## 2024-03-30 DIAGNOSIS — E039 Hypothyroidism, unspecified: Secondary | ICD-10-CM | POA: Diagnosis not present

## 2024-03-30 DIAGNOSIS — I10 Essential (primary) hypertension: Secondary | ICD-10-CM | POA: Diagnosis not present

## 2024-03-30 DIAGNOSIS — E876 Hypokalemia: Secondary | ICD-10-CM | POA: Diagnosis not present

## 2024-03-30 DIAGNOSIS — E1165 Type 2 diabetes mellitus with hyperglycemia: Secondary | ICD-10-CM | POA: Diagnosis not present

## 2024-03-30 DIAGNOSIS — N184 Chronic kidney disease, stage 4 (severe): Secondary | ICD-10-CM | POA: Diagnosis not present

## 2024-03-30 DIAGNOSIS — E1122 Type 2 diabetes mellitus with diabetic chronic kidney disease: Secondary | ICD-10-CM | POA: Diagnosis not present

## 2024-04-05 DIAGNOSIS — E1122 Type 2 diabetes mellitus with diabetic chronic kidney disease: Secondary | ICD-10-CM | POA: Diagnosis not present

## 2024-04-05 DIAGNOSIS — Z6837 Body mass index (BMI) 37.0-37.9, adult: Secondary | ICD-10-CM | POA: Diagnosis not present

## 2024-04-05 DIAGNOSIS — Z23 Encounter for immunization: Secondary | ICD-10-CM | POA: Diagnosis not present

## 2024-04-05 DIAGNOSIS — E782 Mixed hyperlipidemia: Secondary | ICD-10-CM | POA: Diagnosis not present

## 2024-04-05 DIAGNOSIS — M1712 Unilateral primary osteoarthritis, left knee: Secondary | ICD-10-CM | POA: Diagnosis not present

## 2024-04-13 DIAGNOSIS — I10 Essential (primary) hypertension: Secondary | ICD-10-CM | POA: Diagnosis not present

## 2024-04-13 DIAGNOSIS — R600 Localized edema: Secondary | ICD-10-CM | POA: Diagnosis not present

## 2024-05-11 DIAGNOSIS — S134XXA Sprain of ligaments of cervical spine, initial encounter: Secondary | ICD-10-CM | POA: Diagnosis not present

## 2024-05-14 DIAGNOSIS — R600 Localized edema: Secondary | ICD-10-CM | POA: Diagnosis not present

## 2024-05-14 DIAGNOSIS — I10 Essential (primary) hypertension: Secondary | ICD-10-CM | POA: Diagnosis not present

## 2024-05-18 DIAGNOSIS — L2089 Other atopic dermatitis: Secondary | ICD-10-CM | POA: Diagnosis not present

## 2024-06-02 DIAGNOSIS — M1712 Unilateral primary osteoarthritis, left knee: Secondary | ICD-10-CM | POA: Diagnosis not present

## 2024-06-02 DIAGNOSIS — M65962 Unspecified synovitis and tenosynovitis, left lower leg: Secondary | ICD-10-CM | POA: Diagnosis not present

## 2024-06-02 DIAGNOSIS — S83272A Complex tear of lateral meniscus, current injury, left knee, initial encounter: Secondary | ICD-10-CM | POA: Diagnosis not present

## 2024-06-02 DIAGNOSIS — S83522A Sprain of posterior cruciate ligament of left knee, initial encounter: Secondary | ICD-10-CM | POA: Diagnosis not present

## 2024-06-02 DIAGNOSIS — M67864 Other specified disorders of tendon, left knee: Secondary | ICD-10-CM | POA: Diagnosis not present

## 2024-06-02 DIAGNOSIS — Z01818 Encounter for other preprocedural examination: Secondary | ICD-10-CM | POA: Diagnosis not present

## 2024-06-02 DIAGNOSIS — M7122 Synovial cyst of popliteal space [Baker], left knee: Secondary | ICD-10-CM | POA: Diagnosis not present

## 2024-06-02 DIAGNOSIS — S83512A Sprain of anterior cruciate ligament of left knee, initial encounter: Secondary | ICD-10-CM | POA: Diagnosis not present

## 2024-06-07 DIAGNOSIS — Z23 Encounter for immunization: Secondary | ICD-10-CM | POA: Diagnosis not present

## 2024-06-11 DIAGNOSIS — R600 Localized edema: Secondary | ICD-10-CM | POA: Diagnosis not present

## 2024-06-11 DIAGNOSIS — I10 Essential (primary) hypertension: Secondary | ICD-10-CM | POA: Diagnosis not present
